# Patient Record
Sex: Male | Born: 1945
Health system: Southern US, Community
[De-identification: ages and names within clinical notes are randomized; demographics above are authoritative.]

## PROBLEM LIST (undated history)

## (undated) DIAGNOSIS — Z860100 Personal history of colon polyps, unspecified: Secondary | ICD-10-CM

## (undated) DIAGNOSIS — E785 Hyperlipidemia, unspecified: Secondary | ICD-10-CM

## (undated) DIAGNOSIS — N529 Male erectile dysfunction, unspecified: Secondary | ICD-10-CM

## (undated) DIAGNOSIS — R0600 Dyspnea, unspecified: Secondary | ICD-10-CM

## (undated) DIAGNOSIS — D649 Anemia, unspecified: Secondary | ICD-10-CM

## (undated) DIAGNOSIS — E119 Type 2 diabetes mellitus without complications: Secondary | ICD-10-CM

## (undated) DIAGNOSIS — I10 Essential (primary) hypertension: Secondary | ICD-10-CM

## (undated) DIAGNOSIS — E349 Endocrine disorder, unspecified: Secondary | ICD-10-CM

## (undated) HISTORY — DX: Personal history of colon polyps, unspecified: Z86.0100

## (undated) HISTORY — DX: Personal history of colonic polyps: Z86.010

## (undated) HISTORY — DX: Dyspnea, unspecified: R06.00

## (undated) HISTORY — DX: Essential (primary) hypertension: I10

## (undated) HISTORY — PX: CATARACT EXTRACTION: SUR2

## (undated) HISTORY — DX: Male erectile dysfunction, unspecified: N52.9

## (undated) HISTORY — DX: Endocrine disorder, unspecified: E34.9

## (undated) HISTORY — DX: Anemia, unspecified: D64.9

## (undated) HISTORY — DX: Type 2 diabetes mellitus without complications: E11.9

## (undated) HISTORY — DX: Hyperlipidemia, unspecified: E78.5

---

## 1975-09-27 HISTORY — PX: VASECTOMY: SHX75

## 1998-05-05 ENCOUNTER — Other Ambulatory Visit: Admission: RE | Admit: 1998-05-05 | Discharge: 1998-05-05 | Payer: Self-pay | Admitting: Gastroenterology

## 1998-09-26 HISTORY — PX: OTHER SURGICAL HISTORY: SHX169

## 2000-12-14 ENCOUNTER — Encounter: Admission: RE | Admit: 2000-12-14 | Discharge: 2000-12-14 | Payer: Self-pay | Admitting: Family Medicine

## 2000-12-14 ENCOUNTER — Encounter: Payer: Self-pay | Admitting: Family Medicine

## 2003-08-18 HISTORY — PX: COLONOSCOPY: SHX174

## 2006-07-24 ENCOUNTER — Encounter: Payer: Self-pay | Admitting: Internal Medicine

## 2008-03-25 ENCOUNTER — Ambulatory Visit: Payer: Self-pay | Admitting: Internal Medicine

## 2008-03-25 LAB — CONVERTED CEMR LAB
ALT: 40 units/L (ref 0–53)
AST: 28 units/L (ref 0–37)
Albumin: 4 g/dL (ref 3.5–5.2)
Alkaline Phosphatase: 111 units/L (ref 39–117)
BUN: 17 mg/dL (ref 6–23)
Basophils Absolute: 0 10*3/uL (ref 0.0–0.1)
Basophils Relative: 0.6 % (ref 0.0–1.0)
Bilirubin Urine: NEGATIVE
Bilirubin, Direct: 0.1 mg/dL (ref 0.0–0.3)
CO2: 30 meq/L (ref 19–32)
Calcium: 9.3 mg/dL (ref 8.4–10.5)
Chloride: 106 meq/L (ref 96–112)
Cholesterol: 170 mg/dL (ref 0–200)
Creatinine, Ser: 1.1 mg/dL (ref 0.4–1.5)
Eosinophils Absolute: 0.1 10*3/uL (ref 0.0–0.7)
Eosinophils Relative: 1.7 % (ref 0.0–5.0)
GFR calc Af Amer: 88 mL/min
GFR calc non Af Amer: 72 mL/min
Glucose, Bld: 170 mg/dL — ABNORMAL HIGH (ref 70–99)
HCT: 39.7 % (ref 39.0–52.0)
HDL: 44.4 mg/dL (ref >39.0)
Hemoglobin, Urine: NEGATIVE
Hemoglobin: 13.5 g/dL (ref 13.0–17.0)
Hgb A1c MFr Bld: 9.9 % — ABNORMAL HIGH (ref 4.6–6.0)
Ketones, ur: NEGATIVE mg/dL
LDL Cholesterol: 114 mg/dL — ABNORMAL HIGH (ref 0–99)
Leukocytes, UA: NEGATIVE
Lymphocytes Relative: 32.1 % (ref 12.0–46.0)
MCHC: 34 g/dL (ref 30.0–36.0)
MCV: 87.3 fL (ref 78.0–100.0)
Monocytes Absolute: 0.5 10*3/uL (ref 0.1–1.0)
Monocytes Relative: 8.3 % (ref 3.0–12.0)
Neutro Abs: 3.3 10*3/uL (ref 1.4–7.7)
Neutrophils Relative %: 57.3 % (ref 43.0–77.0)
Nitrite: NEGATIVE
PSA: 1.54 ng/mL (ref 0.10–4.00)
Platelets: 242 10*3/uL (ref 150–400)
Potassium: 4.2 meq/L (ref 3.5–5.1)
RBC: 4.54 M/uL (ref 4.22–5.81)
RDW: 12.6 % (ref 11.5–14.6)
Sodium: 143 meq/L (ref 135–145)
Specific Gravity, Urine: >=1.03 (ref 1.000–1.03)
TSH: 1.8 microintl units/mL (ref 0.35–5.50)
Total Bilirubin: 0.9 mg/dL (ref 0.3–1.2)
Total CHOL/HDL Ratio: 3.8
Total Protein: 7.1 g/dL (ref 6.0–8.3)
Triglycerides: 56 mg/dL (ref 0–149)
Urine Glucose: NEGATIVE mg/dL
Urobilinogen, UA: 0.2 (ref 0.0–1.0)
VLDL: 11 mg/dL (ref 0–40)
WBC: 5.8 10*3/uL (ref 4.5–10.5)
pH: 5.5 (ref 5.0–8.0)

## 2008-03-26 ENCOUNTER — Ambulatory Visit: Payer: Self-pay | Admitting: Internal Medicine

## 2008-03-26 DIAGNOSIS — E785 Hyperlipidemia, unspecified: Secondary | ICD-10-CM

## 2008-03-26 DIAGNOSIS — E119 Type 2 diabetes mellitus without complications: Secondary | ICD-10-CM

## 2008-03-26 DIAGNOSIS — E1129 Type 2 diabetes mellitus with other diabetic kidney complication: Secondary | ICD-10-CM | POA: Insufficient documentation

## 2008-03-26 DIAGNOSIS — Z860100 Personal history of colon polyps, unspecified: Secondary | ICD-10-CM | POA: Insufficient documentation

## 2008-03-26 DIAGNOSIS — I10 Essential (primary) hypertension: Secondary | ICD-10-CM | POA: Insufficient documentation

## 2008-06-23 ENCOUNTER — Ambulatory Visit: Payer: Self-pay | Admitting: Internal Medicine

## 2008-06-23 LAB — CONVERTED CEMR LAB
Cholesterol: 159 mg/dL (ref 0–200)
HDL: 37.9 mg/dL — ABNORMAL LOW (ref >39.0)
Hgb A1c MFr Bld: 7.5 % — ABNORMAL HIGH (ref 4.6–6.0)
LDL Cholesterol: 112 mg/dL — ABNORMAL HIGH (ref 0–99)
Total CHOL/HDL Ratio: 4.2
Triglycerides: 47 mg/dL (ref 0–149)
VLDL: 9 mg/dL (ref 0–40)

## 2008-06-24 ENCOUNTER — Encounter: Payer: Self-pay | Admitting: Internal Medicine

## 2008-06-26 ENCOUNTER — Ambulatory Visit: Payer: Self-pay | Admitting: Internal Medicine

## 2009-01-07 ENCOUNTER — Encounter: Payer: Self-pay | Admitting: Internal Medicine

## 2009-10-15 ENCOUNTER — Ambulatory Visit: Payer: Self-pay | Admitting: Internal Medicine

## 2009-10-15 DIAGNOSIS — N529 Male erectile dysfunction, unspecified: Secondary | ICD-10-CM | POA: Insufficient documentation

## 2009-10-15 LAB — CONVERTED CEMR LAB: Blood Glucose, Fingerstick: 153

## 2009-10-19 ENCOUNTER — Ambulatory Visit: Payer: Self-pay | Admitting: Internal Medicine

## 2009-10-19 LAB — CONVERTED CEMR LAB
ALT: 32 units/L (ref 0–53)
AST: 27 units/L (ref 0–37)
Albumin: 4.2 g/dL (ref 3.5–5.2)
Alkaline Phosphatase: 88 units/L (ref 39–117)
BUN: 21 mg/dL (ref 6–23)
Bilirubin, Direct: 0.1 mg/dL (ref 0.0–0.3)
CO2: 29 meq/L (ref 19–32)
Calcium: 9.3 mg/dL (ref 8.4–10.5)
Chloride: 107 meq/L (ref 96–112)
Cholesterol: 173 mg/dL (ref 0–200)
Creatinine, Ser: 1.1 mg/dL (ref 0.4–1.5)
GFR calc non Af Amer: 71.83 mL/min (ref >60)
Glucose, Bld: 151 mg/dL — ABNORMAL HIGH (ref 70–99)
HDL: 46.8 mg/dL (ref >39.00)
Hgb A1c MFr Bld: 8.3 % — ABNORMAL HIGH (ref 4.6–6.5)
LDL Cholesterol: 112 mg/dL — ABNORMAL HIGH (ref 0–99)
Potassium: 5 meq/L (ref 3.5–5.1)
Sodium: 142 meq/L (ref 135–145)
Testosterone: 296.69 ng/dL — ABNORMAL LOW (ref 350.00–890.00)
Total Bilirubin: 0.8 mg/dL (ref 0.3–1.2)
Total CHOL/HDL Ratio: 4
Total Protein: 7.4 g/dL (ref 6.0–8.3)
Triglycerides: 69 mg/dL (ref 0.0–149.0)
VLDL: 13.8 mg/dL (ref 0.0–40.0)

## 2009-10-20 ENCOUNTER — Encounter: Payer: Self-pay | Admitting: Internal Medicine

## 2009-12-10 ENCOUNTER — Telehealth: Payer: Self-pay | Admitting: Internal Medicine

## 2010-01-13 ENCOUNTER — Ambulatory Visit: Payer: Self-pay | Admitting: Internal Medicine

## 2010-02-15 ENCOUNTER — Telehealth: Payer: Self-pay | Admitting: Internal Medicine

## 2010-03-19 ENCOUNTER — Encounter: Payer: Self-pay | Admitting: Internal Medicine

## 2010-04-05 ENCOUNTER — Telehealth: Payer: Self-pay | Admitting: Internal Medicine

## 2010-04-23 ENCOUNTER — Ambulatory Visit: Payer: Self-pay | Admitting: Internal Medicine

## 2010-04-23 LAB — CONVERTED CEMR LAB
Cholesterol: 108 mg/dL (ref 0–200)
HDL: 33.5 mg/dL — ABNORMAL LOW (ref >39.00)
Hgb A1c MFr Bld: 8.1 % — ABNORMAL HIGH (ref 4.6–6.5)
LDL Cholesterol: 65 mg/dL (ref 0–99)
Total CHOL/HDL Ratio: 3
Triglycerides: 46 mg/dL (ref 0.0–149.0)
VLDL: 9.2 mg/dL (ref 0.0–40.0)

## 2010-04-29 ENCOUNTER — Encounter: Payer: Self-pay | Admitting: Internal Medicine

## 2010-05-20 ENCOUNTER — Telehealth: Payer: Self-pay | Admitting: Internal Medicine

## 2010-05-27 ENCOUNTER — Ambulatory Visit: Payer: Self-pay | Admitting: Internal Medicine

## 2010-05-28 ENCOUNTER — Telehealth: Payer: Self-pay | Admitting: Internal Medicine

## 2010-06-02 ENCOUNTER — Telehealth: Payer: Self-pay | Admitting: Internal Medicine

## 2010-08-25 ENCOUNTER — Ambulatory Visit: Payer: Self-pay | Admitting: Internal Medicine

## 2010-08-26 ENCOUNTER — Telehealth: Payer: Self-pay | Admitting: Internal Medicine

## 2010-08-26 DIAGNOSIS — E291 Testicular hypofunction: Secondary | ICD-10-CM | POA: Insufficient documentation

## 2010-09-02 ENCOUNTER — Ambulatory Visit: Payer: Self-pay | Admitting: Internal Medicine

## 2010-09-07 ENCOUNTER — Encounter: Payer: Self-pay | Admitting: Internal Medicine

## 2010-10-05 ENCOUNTER — Other Ambulatory Visit: Payer: Self-pay | Admitting: Internal Medicine

## 2010-10-05 ENCOUNTER — Ambulatory Visit
Admission: RE | Admit: 2010-10-05 | Discharge: 2010-10-05 | Payer: Self-pay | Source: Home / Self Care | Attending: Internal Medicine | Admitting: Internal Medicine

## 2010-10-05 LAB — HEMOGLOBIN A1C: Hgb A1c MFr Bld: 7.5 % — ABNORMAL HIGH (ref 4.6–6.5)

## 2010-10-05 LAB — TESTOSTERONE: Testosterone: 723.49 ng/dL (ref 350.00–890.00)

## 2010-10-14 ENCOUNTER — Encounter: Payer: Self-pay | Admitting: Internal Medicine

## 2010-10-26 NOTE — Progress Notes (Signed)
Summary: ANDROGEL NOT COVERED  Phone Note From Pharmacy   Caller: [facility]* Summary of Call: Pharm called, Androgel is not covered by insurance. Testim gel is covered and they want to change rx, OK?  Initial call taken by: Lamar Sprinkles, CMA,  April 05, 2010 12:06 PM  Follow-up for Phone Call        ok to chagne to testim Follow-up by: Jacques Navy MD,  April 05, 2010 1:48 PM  Additional Follow-up for Phone Call Additional follow up Details #1::        Spoke w/pharmacy to verify medication. Called in refill and updated EMR Additional Follow-up by: Lamar Sprinkles, CMA,  April 05, 2010 5:55 PM    New/Updated Medications: TESTIM 1 % GEL (TESTOSTERONE) use as directed once daily TESTIM 1 % GEL (TESTOSTERONE) 5 g packet once daily Prescriptions: TESTIM 1 % GEL (TESTOSTERONE) 5 g packet once daily  #3 mth x 1   Entered by:   Lamar Sprinkles, CMA   Authorized by:   Jacques Navy MD   Signed by:   Lamar Sprinkles, CMA on 04/05/2010   Method used:   Telephoned to ...       [facility]* (retail)       [address]       [place], [place]  [address]       Ph: [phone]       Fax: [phone]   RxID:   [id number] TESTIM 1 % GEL (TESTOSTERONE) use as directed once daily  #30 x 11   Entered by:   Lucious Groves   Authorized by:   Jacques Navy MD   Signed by:   Lucious Groves on 04/05/2010   Method used:   Printed then faxed to ...       [facility]* (retail)       [address]       [place], [place]  [address]       Ph: [phone]       Fax: [phone]   RxID:   [id number]

## 2010-10-26 NOTE — Progress Notes (Signed)
Summary: F/u - RESULTS  Phone Note Call from Patient   Summary of Call: Pt left vm that he did not get lab results from last office visit. Gave pt info over the phone and transferred to scheduler for f/u office visit as MD requested.  Initial call taken by: Lamar Sprinkles, CMA,  May 20, 2010 3:47 PM

## 2010-10-26 NOTE — Letter (Signed)
   [facility] [address] [place], [place]  [address] Phone: [phone]      April 29, 2010   SIGISMUND CROSS [address] [place], [place] [address]  RE:  LAB RESULTS  Dear  Mr. Nabozny,  The following is an interpretation of your most recent lab tests.  Please take note of any instructions provided or changes to medications that have resulted from your lab work.  Health professionals look at cholesterol as more involved than just the total cholesterol. We consider the level of LDL (bad) cholesterol, HDL (good), cholesterol, and Triglycerides (Grease) in the blood.  1. Your LDL should be under 100, and the HDL should be over 45, if you have any vascular disease such as heart attack, angina, stroke, TIA (mini stroke), claudication (pain in the legs when you walk due to poor circulation),  Abdominal Aortic Aneurysm (AAA), diabetes or prediabetes.  2. Your LDL should be under 130 if you have any two of the following:     a. Smoke or chew tobacco,     b. High blood pressure (if you are on medication or over 140/90 without medication),     c. Male gender,    d. HDL below 40,    e. A male relative (father, brother, or son), who have had any vascular event          as described in #1. above under the age of 49, or a male relative (mother,       sister, or daughter) who had an event as described above under age 17. (An HDL over 60 will subtract one risk factor from the total, so if you have two items in # 2 above, but an HDL over 60, you then fall into category # 3 below).  3. Your LDL should be under 160 if you have any one of the above.  Triglycerides should be under 200 with the ideal being under 150.  For diabetes or pre-diabetes, the ideal HgbA1C should be under 6.0%.  If you fall into any of the above categories, you should make a follow up appointment to discuss this with your physician.  LIPID PANEL:  Good - no changes needed Triglyceride: 46.0    Cholesterol: 108   LDL: 65   HDL: 33.50   Chol/HDL%:  3   DIABETIC STUDIES:  Poor - schedule a follow-up appointment soon Blood Glucose: 151   HgbA1C: 8.1      Cholesterol is great but blood sugar has not been controlled. Please schedule an appointment so we can address the diabetes.    Sincerely Yours,    Jacques Navy MD

## 2010-10-26 NOTE — Letter (Signed)
[facility] [address] [place], [place]  [address] Phone: [phone]      October 20, 2009   Theodore Estes [address] [place] [address] [place], [place] [address]  RE:  LAB RESULTS  Dear  Mr. Bady,  The following is an interpretation of your most recent lab tests.  Please take note of any instructions provided or changes to medications that have resulted from your lab work.  ELECTROLYTES:  Good - no changes needed  KIDNEY FUNCTION TESTS:  Good - no changes needed  LIVER FUNCTION TESTS:  Good - no changes needed  Health professionals look at cholesterol as more involved than just the total cholesterol. We consider the level of LDL (bad) cholesterol, HDL (good), cholesterol, and Triglycerides (Grease) in the blood.  1. Your LDL should be under 100, and the HDL should be over 45, if you have any vascular disease such as heart attack, angina, stroke, TIA (mini stroke), claudication (pain in the legs when you walk due to poor circulation),  Abdominal Aortic Aneurysm (AAA), diabetes or prediabetes.  2. Your LDL should be under 130 if you have any two of the following:     a. Smoke or chew tobacco,     b. High blood pressure (if you are on medication or over 140/90 without medication),     c. Male gender,    d. HDL below 40,    e. A male relative (father, brother, or son), who have had any vascular event          as described in #1. above under the age of 68, or a male relative (mother,       sister, or daughter) who had an event as described above under age 70. (An HDL over 60 will subtract one risk factor from the total, so if you have two items in # 2 above, but an HDL over 60, you then fall into category # 3 below).  3. Your LDL should be under 160 if you have any one of the above.  Triglycerides should be under 200 with the ideal being under 150.  For diabetes or pre-diabetes, the ideal HgbA1C should be under 6.0%.  If you fall into any of the above categories,  you should make a follow up appointment to discuss this with your physician.  LIPID PANEL:  Fair - review at your next visit Triglyceride: 69.0   Cholesterol: 173   LDL: 112   HDL: 46.80   Chol/HDL%:  4   DIABETIC STUDIES:  Poor - schedule a follow-up appointment soon Blood Glucose: 151   HgbA1C: 8.3     Testosteron 296 - low  Please call my office to make arrangements for further tests or an appointment.  Sincerely Yours,    Jacques Navy MD  Patient: Pia Mau Note: All result statuses are Final unless otherwise noted.  Tests: (1) Lipid Panel (LIPID)   Cholesterol               173 mg/dL                   2-956     ATP III Classification            Desirable:  < 200 mg/dL                    Borderline High:  200 - 239 mg/dL               High:  > =  240 mg/dL   Triglycerides             69.0 mg/dL                  0.4-540.9     Normal:  <150 mg/dL     Borderline High:  811 - 199 mg/dL   HDL                       91.47 mg/dL                 >82.95   VLDL Cholesterol          13.8 mg/dL                  6.2-13.0   LDL Cholesterol      [H]  865 mg/dL                   7-84  CHO/HDL Ratio:  CHD Risk                             4                    Men          Women     1/2 Average Risk     3.4          3.3     Average Risk          5.0          4.4     2X Average Risk          9.6          7.1     3X Average Risk          15.0          11.0                           Tests: (2) Hepatic/Liver Function Panel (HEPATIC)   Total Bilirubin           0.8 mg/dL                   6.9-6.2   Direct Bilirubin          0.1 mg/dL                   9.5-2.8   Alkaline Phosphatase      88 U/L                      39-117   AST                       27 U/L                      0-37   ALT                       32 U/L                      0-53   Total Protein             7.4 g/dL  6.0-8.3   Albumin                   4.2 g/dL                    1.6-1.0  Tests: (3)  BMP (METABOL)   Sodium                    142 mEq/L                   135-145   Potassium                 5.0 mEq/L                   3.5-5.1   Chloride                  107 mEq/L                   96-112   Carbon Dioxide            29 mEq/L                    19-32   Glucose              [H]  151 mg/dL                   96-04   BUN                       21 mg/dL                    5-40   Creatinine                1.1 mg/dL                   9.8-1.1   Calcium                   9.3 mg/dL                   9.1-47.8   GFR                       71.83 mL/min                >60  Tests: (4) Hemoglobin A1C (A1C)   Hemoglobin A1C       [H]  8.3 %                       4.6-6.5     Glycemic Control Guidelines for People with Diabetes:     Non Diabetic:  <6%     Goal of Therapy: <7%     Additional Action Suggested:  >8%   Tests: (5) Testosterone, Total (TESTO)   Testosterone         [L]  296.69 ng/dL                295.62-130.86

## 2010-10-26 NOTE — Progress Notes (Signed)
Summary: ANDROGEL CHANGE  Phone Note From Pharmacy   Summary of Call: Pharm called, Rx for androgel is currently 1 percent. This is no longer made. Per pharm, The new Androgel 1.62% is only rx available, if this is ok doseage needs to be adjusted.  Initial call taken by: Lamar Sprinkles, CMA,  Feb 15, 2010 10:01 AM  Follow-up for Phone Call        OK for new strength Apply 5 g patch daily, # 30 patches, refill prn Follow-up by: Jacques Navy MD,  Feb 15, 2010 1:35 PM  Additional Follow-up for Phone Call Additional follow up Details #1::        [facility] Tawanna Cooler called requesting MD change Rx because pt has called pharmacy several times. Todd informed Additional Follow-up by: Margaret Pyle, CMA,  Feb 15, 2010 3:47 PM    New/Updated Medications: * ANDROGEL PUMP 1.62% GEL (TESTOSTERONE) use as directed once daily Prescriptions: ANDROGEL PUMP 1.62% GEL (TESTOSTERONE) use as directed once daily  #30 x 11   Entered and Authorized by:   Margaret Pyle, CMA   Signed by:   Margaret Pyle, CMA on 02/15/2010   Method used:   Telephoned to ...       [facility]* (retail)       [address]       [place], [place]  [address]       Ph: [phone]       Fax: [phone]   RxID:   [id number]

## 2010-10-26 NOTE — Progress Notes (Signed)
  Phone Note From Pharmacy   Caller: [facility]* Summary of Call: Pharm called, Actos will cost almost 100 dollars. They would like alternative is possible.  Initial call taken by: Lamar Sprinkles, CMA,  May 28, 2010 11:25 AM  Follow-up for Phone Call        tried calling patient - no answer. called pharmacy - no answer left a message: should be on formulary. If there is a deductible issue it will be the same for most products that I would prescribe. Follow-up by: Jacques Navy MD,  May 28, 2010 6:37 PM

## 2010-10-26 NOTE — Assessment & Plan Note (Signed)
Summary: UNSTABLE BLOOD SUGAR LEVELS-LB   Vital Signs:  Patient profile:   65 year old male Height:      69 inches Weight:      200 pounds BMI:     29.64 O2 Sat:      97 % on Room air Temp:     97.9 degrees F oral Pulse rate:   71 / minute BP sitting:   148 / 100  (left arm) Cuff size:   large  Vitals Entered By: Ami Bullins CMA (October 15, 2009 4:36 PM)  O2 Flow:  Room air CC: PT here to discuss issues with CBGs. Pt is having trouble keeping his blood sugar under 125/ ab CBG Result 153   Primary Care Provider:  Cam Harnden  CC:  PT here to discuss issues with CBGs. Pt is having trouble keeping his blood sugar under 125/ ab.  History of Present Illness: Patient not seen for some time. He has been diagnosed with diabetes but has not had lab follow-up. He reports that his CBGs have been elevated but he has not had many symptoms. He takes no medications.  He does c/o ED - inability to maintain an erection.    Current Medications (verified): 1)  Aspirin 81 Mg  Tabs (Aspirin) .... Take 1 Tablet By Mouth Once A Day 2)  Enalapril Maleate 20 Mg  Tabs (Enalapril Maleate) .Marland Kitchen.. 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    Physical Exam  General:  Well-developed,well-nourished,in no acute distress; alert,appropriate and cooperative throughout examination Head:  normocephalic and atraumatic.   Eyes:  corneas and lenses clear and no injection.   Lungs:  normal respiratory effort.   Heart:  normal rate and regular rhythm.     Impression & Recommendations:  Problem # 1:  DIABETES MELLITUS, TYPE II, UNCONTROLLED (ICD-250.02)  His updated medication list for this problem includes:    Aspirin 81 Mg Tabs (Aspirin) .Marland Kitchen... Take 1 tablet by mouth once a day    Enalapril Maleate 20 Mg Tabs (Enalapril maleate) .Marland Kitchen... 1 by mouth once daily  Labs Reviewed: Creat: 1.1 (03/25/2008)    Reviewed HgBA1c results: 7.5 (06/23/2008)  9.9 (03/25/2008)  Patient now with elevated CBGs but no significant symptoms.  Plan A1C with recommendations to follow         Reviewed with the patient the goals of treatment and management.  Problem # 2:  ERECTILE DYSFUNCTION, ORGANIC (ICD-607.84) Possibly due to diabetes vs vascular disease vs testosterone deficiency  Plan - lab for testosterone level           Trial of levitra  His updated medication list for this problem includes:    Levitra 20 Mg Tabs (Vardenafil hcl) .Marland Kitchen... 1 by mouth as needed  Problem # 3:  HYPERTENSION (ICD-401.9)  His updated medication list for this problem includes:    Enalapril Maleate 20 Mg Tabs (Enalapril maleate) .Marland Kitchen... 1 by mouth once daily  BP today: 148/100 Prior BP: 104/70 (06/26/2008)  ]Subotpimal control - incrased risk cardiovascular disease  Plan - continue meds           monitor BP at home  Complete Medication List: 1)  Aspirin 81 Mg Tabs (Aspirin) .... Take 1 tablet by mouth once a day 2)  Enalapril Maleate 20 Mg Tabs (Enalapril maleate) .Marland Kitchen.. 1 by mouth once daily 3)  Levitra 20 Mg Tabs (Vardenafil hcl) .Marland Kitchen.. 1 by mouth as  needed  Laboratory Results   Blood Tests    Date/Time Reported: 10/15/2009 at 4:30pm  CBG Random:: 153mg /dL

## 2010-10-26 NOTE — Letter (Signed)
Summary: [facility]  [facility]   Imported By: Lennie Odor 03/30/2010 11:40:32  _____________________________________________________________________  External Attachment:    Type:   Image     Comment:   External Document

## 2010-10-26 NOTE — Progress Notes (Signed)
  Phone Note Call from Patient   Caller: [facility] Summary of Call: Pt has high deductible plan and wants to know if there is a cheaper alternative to actos. Need to call pharm and inform per last phone note that any alternatives will be same high cost and MD would like pt to take medication.  Initial call taken by: Lamar Sprinkles, CMA,  June 02, 2010 1:48 PM  Follow-up for Phone Call        Pharm informed Follow-up by: Lamar Sprinkles, CMA,  June 02, 2010 2:51 PM

## 2010-10-26 NOTE — Progress Notes (Signed)
Summary: results  Phone Note Call from Patient   Caller: Spouse Summary of Call: Patient spouse called for lab results, specifically A1c. CPX schedule for 4/11. Please advise. Initial call taken by: Lucious Groves,  December 10, 2009 3:17 PM  Follow-up for Phone Call        last labs jan 24th - letter sent jan 25th. No other labs in system. OK to resend the Jan 25th letter.  Follow-up by: Jacques Navy MD,  December 10, 2009 6:15 PM  Additional Follow-up for Phone Call Additional follow up Details #1::        letter mailed Additional Follow-up by: Lamar Sprinkles, CMA,  December 11, 2009 12:05 PM

## 2010-10-26 NOTE — Assessment & Plan Note (Signed)
Summary: CPX/#/CD    Vital Signs:  Patient profile:   65 year old male Height:      69 inches (175.26 cm) Weight:      203 pounds (92.27 kg) BMI:     30.09 O2 Sat:      95 % on Room air Temp:     97.9 degrees F (36.61 degrees C) oral Pulse rate:   88 / minute Pulse rhythm:   regular BP sitting:   126 / 76  (left arm) Cuff size:   large  Vitals Entered By: Brenton Grills (January 13, 2010 9:21 AM)  O2 Flow:  Room air CC: CPX/pt is due for tetanus/aj   Primary Care Provider:  Kharson Rasmusson  CC:  CPX/pt is due for tetanus/aj.  History of Present Illness: Patient last seen in Jan '11. He returns today for complete exam.   Reviewed labs from Jan: A1C 8.3%, LDL 112; Testosterone 297 (350-890).   He reports that Levitra was not effective for ED.   He has no other complaints and has been feeling well. He is trying to follow a no sugar low carb diet.   Current Medications (verified): 1)  Aspirin 81 Mg  Tabs (Aspirin) .... Take 1 Tablet By Mouth Once A Day 2)  Enalapril Maleate 20 Mg  Tabs (Enalapril Maleate) .Marland Kitchen.. 1 By Mouth Once Daily 3)  Levitra 20 Mg Tabs (Vardenafil Hcl) .Marland Kitchen.. 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  2 daughters - '76, '77; 5 grandchildren marriage in good health  Risk Factors: Caffeine Use: 4 (2008/04/18) Exercise: no (2008-04-18)  Past Medical History: UCD ERECTILE DYSFUNCTION, ORGANIC (ICD-607.84) HEALTHY ADULT MALE (ICD-V70.0) HYPERLIPIDEMIA (ICD-272.4) DIABETES MELLITUS, TYPE II, UNCONTROLLED (ICD-250.02) COLONIC POLYPS, HX OF  (ICD-V12.72) HYPERTENSION (ICD-401.9)  Review of Systems  The patient denies anorexia, fever, weight loss, weight gain, vision loss, decreased hearing, chest pain, syncope, dyspnea on exertion, prolonged cough, hemoptysis, abdominal pain, hematochezia, hematuria, genital sores, muscle weakness, transient blindness, depression, abnormal bleeding, and angioedema.    Physical Exam  General:  alert, well-developed, well-nourished, and normal appearance.   Head:  normocephalic, atraumatic, and no abnormalities observed.   Eyes:  pupils equal, pupils round, corneas and lenses clear, no injection, and no retinal abnormalitiies.   Ears:  R ear normal and L ear normal.   Nose:  no external deformity and no external erythema.   Mouth:  Oral mucosa and oropharynx without lesions or exudates.  Teeth in good repair. Neck:  supple, full ROM, no thyromegaly, no carotid bruits, and no cervical lymphadenopathy.   Chest Wall:  no deformities and no tenderness.   Lungs:  Normal respiratory effort, chest expands symmetrically. Lungs are clear to auscultation, no crackles or wheezes. Heart:  Normal rate and regular rhythm. S1 and S2 normal without gallop, murmur, click, rub or other extra sounds. Abdomen:  soft, non-tender, normal bowel sounds, no guarding, and no hepatomegaly.   Rectal:  No external abnormalities noted. Normal sphincter tone. No rectal masses or tenderness. Genitalia:  uncircumcised and no hydrocele.  Small left varicocele. Testicle normal texture, just a little small. Palpable vasectomy clip Prostate:  Prostate gland firm  and smooth, no enlargement, nodularity, tenderness, mass, asymmetry or induration. Msk:  normal ROM, no joint tenderness, no joint swelling, no joint warmth, no redness over joints, and no joint deformities.   Pulses:  2+ RADIAL  AND dp PULSES Extremities:  No clubbing, cyanosis, edema, or deformity noted with normal full range of motion of all joints.   Neurologic:  No  cranial nerve deficits noted. Station and gait are normal. Plantar reflexes are down-going bilaterally. DTRs are symmetrical throughout. Sensory, motor and coordinative functions appear intact. Skin:  turgor normal and color normal.  Icthyosis wide-spread. NO open lesions. Cervical Nodes:  no anterior cervical adenopathy and no posterior cervical adenopathy.   Axillary Nodes:  no R axillary adenopathy and no L axillary adenopathy.   Psych:  Oriented X3, memory intact for recent and remote, normally interactive, and good eye contact.    Diabetes Management Exam:    Foot Exam (with socks and/or shoes not present):       Sensory-Pinprick/Light touch:          Left medial foot (L-4): normal          Left dorsal foot (L-5): normal          Left lateral foot (S-1): normal          Right medial foot (L-4): normal          Right dorsal foot (L-5): normal          Right lateral foot (S-1): normal       Sensory-other: decreased deep vibratory sensation   Impression & Recommendations:  Problem # 1:  DIABETES MELLITUS, TYPE II, UNCONTROLLED (ICD-250.02) Reviewed January labs with the patient - A1C 8.3% making a definitive diagnosis. Discussed his increased risks for cardiac disease and other end organ disease. The importance of good control was stressed as well as the need to carefully manage all other risk factors.  Plan - continue sugar free, low carb diet           strive for exercise daily           start metformin 500mg  two times a day           follow-up lab in 3 months  His updated medication list for this problem includes:    Aspirin 81 Mg Tabs (Aspirin) .Marland Kitchen... Take 1 tablet by mouth once a day    Enalapril Maleate 20 Mg Tabs (Enalapril maleate) .Marland Kitchen... 1 by mouth once daily    Metformin Hcl 500 Mg Tabs (Metformin hcl) .Marland Kitchen... 1 by mouth two times a day  Problem # 2:  HYPERTENSION (ICD-401.9)  His updated medication list for this problem includes:    Enalapril Maleate 20 Mg Tabs (Enalapril  maleate) .Marland Kitchen... 1 by mouth once daily  BP today: 126/76 Prior BP: 148/100 (10/15/2009)  Labs Reviewed: K+: 5.0 (10/19/2009) Creat: : 1.1 (10/19/2009)   Chol: 173 (10/19/2009)   HDL: 46.80 (10/19/2009)   LDL: 112 (10/19/2009)   TG: 69.0 (10/19/2009)  Good control. Also renal benefit of ACE-I in setting of diabetes  Problem # 3:  HYPERLIPIDEMIA (ICD-272.4)  His updated medication list for this problem includes:    Lovastatin 20 Mg Tabs (Lovastatin) .Marland Kitchen... 1 by mouth qpm  Labs Reviewed: SGOT: 27 (10/19/2009)   SGPT: 32 (10/19/2009)   HDL:46.80 (10/19/2009), 37.9 (06/23/2008)  LDL:112 (10/19/2009), 112 (06/23/2008)  Chol:173 (10/19/2009), 159 (06/23/2008)  Trig:69.0 (10/19/2009), 47 (06/23/2008)  Explained that the goal for a diabetic is an LDL 100 or less.  Furthermore, the literature supports the use of a "statin" drug in any diabetic regardless of the baseline LDL for the cardio-protective effect.  Plan - start lovastatin 20mg  once daily           follow-up lab 3 months.   Problem # 4:  ERECTILE DYSFUNCTION, ORGANIC (KGM-010.27) Patient with ED that did not respond to levitra. Possibly diabetic related vs hypogonadism with a below normal testosterone.  Plan - Androgel 5g apply daily           if no improvement in 4-8 weeks will consider urology referral.  The following medications were removed from the medication list:    Levitra 20 Mg Tabs (Vardenafil hcl) .Marland Kitchen... 1 by mouth as needed  Problem # 5:  Preventive Health Care (ICD-V70.0) History as noted. Normal physical exam. Lab results generally OK  with exceptions above. Current with colorectal cancer screening and prostate cancer screening. Has had zostavax. Is a candidate for pneumovax and tetnus.  In summary - a very nice man who appears medically stable today. Will address his diabetes and associated risk factors as outlined above. He will return for follow-up lab in 3 months.   Complete Medication List: 1)  Aspirin 81 Mg Tabs  (Aspirin) .... Take 1 tablet by mouth once a day 2)  Enalapril Maleate 20 Mg Tabs (Enalapril maleate) .Marland Kitchen.. 1 by mouth once daily 3)  Metformin Hcl 500 Mg Tabs (Metformin hcl) .Marland Kitchen.. 1 by mouth two times a day 4)  Androgel 50 Mg/5gm Gel (Testosterone) .... Apply 5 g q am 5)  Lovastatin 20 Mg Tabs (Lovastatin) .Marland Kitchen.. 1 by mouth qpm   Patient: Theodore Estes Note: All result statuses are Final unless otherwise noted.  Tests: (1) Lipid Panel (LIPID)   Cholesterol               173 mg/dL                   2-536     ATP III Classification            Desirable:  < 200 mg/dL                    Borderline High:  200 - 239 mg/dL               High:  > = 240 mg/dL   Triglycerides             69.0 mg/dL                  6.4-403.4     Normal:  <150 mg/dL     Borderline High:  742 - 199 mg/dL   HDL                       59.56 mg/dL                 >38.75   VLDL Cholesterol          13.8 mg/dL                  6.4-33.2   LDL Cholesterol      [H]  951 mg/dL                   8-84  CHO/HDL Ratio:  CHD Risk  4                    Men          Women     1/2 Average Risk     3.4          3.3     Average Risk          5.0          4.4     2X Average Risk          9.6          7.1     3X Average Risk          15.0          11.0                           Tests: (2) Hepatic/Liver Function Panel (HEPATIC)   Total Bilirubin           0.8 mg/dL                   5.9-5.6   Direct Bilirubin          0.1 mg/dL                   3.8-7.5   Alkaline Phosphatase      88 U/L                      39-117   AST                       27 U/L                      0-37   ALT                       32 U/L                      0-53   Total Protein             7.4 g/dL                    6.4-3.3   Albumin                   4.2 g/dL                    2.9-5.1  Tests: (3) BMP (METABOL)   Sodium                    142 mEq/L                   135-145   Potassium                 5.0 mEq/L                    3.5-5.1   Chloride                  107 mEq/L                   96-112   Carbon Dioxide            29 mEq/L  19-32   Glucose              [H]  151 mg/dL                   09-81   BUN                       21 mg/dL                    1-91   Creatinine                1.1 mg/dL                   4.7-8.2   Calcium                   9.3 mg/dL                   9.5-62.1   GFR                       71.83 mL/min                >60  Tests: (4) Hemoglobin A1C (A1C)   Hemoglobin A1C       [H]  8.3 %                       4.6-6.5     Glycemic Control Guidelines for People with Diabetes:     Non Diabetic:  <6%     Goal of Therapy: <7%     Additional Action Suggested:  >8%   Tests: (5) Testosterone, Total (TESTO)   Testosterone         [L]  296.69 ng/dL                [id number]: ANDROGEL 50 MG/5GM GEL (TESTOSTERONE) apply 5 g q AM  #30 x 5   Entered and Authorized by:   Jacques Navy MD   Signed by:   Jacques Navy MD on 01/13/2010   Method used:   Print then Give to Patient   RxID:   [id number] ENALAPRIL MALEATE 20 MG  TABS (ENALAPRIL MALEATE) 1 by mouth once daily  #30 x 12   Entered and Authorized by:   Jacques Navy MD   Signed by:   Jacques Navy MD on 01/13/2010   Method used:   Electronically to        [facility]* (retail)       [address]       [place], [place]  [address]       Ph: [phone]       Fax: [phone]   RxID:   [id number] LOVASTATIN 20 MG TABS (LOVASTATIN) 1 by mouth qPM  #30 x 12   Entered and Authorized by:   Jacques Navy MD   Signed by:   Jacques Navy MD on 01/13/2010   Method used:   Electronically to        [facility]* (retail)       [address]       [place], [place]  [address]       Ph: [phone]       Fax: [phone]   RxID:   [id number] METFORMIN HCL 500 MG TABS (METFORMIN HCL) 1 by mouth two times a day  #60 x 12   Entered  and Authorized by:   Jacques Navy  MD   Signed by:   Jacques Navy MD on 01/13/2010   Method used:   Electronically to        [facility]* (retail)       [address]       [place], [place]  [address]       Ph: [phone]       Fax: [phone]   RxID:   [id number]      Immunization History:  Influenza Immunization History:    Influenza:  historical (06/26/2009)

## 2010-10-26 NOTE — Assessment & Plan Note (Signed)
Summary: FU ON LABS/ NWS  #   Vital Signs:  Patient profile:   65 year old male Height:      69 inches Weight:      196 pounds BMI:     29.05 O2 Sat:      96 % on Room air Temp:     97.6 degrees F oral Pulse rate:   78 / minute BP sitting:   120 / 92  (left arm) Cuff size:   regular  Vitals Entered By: Ami Bullins CMA (May 27, 2010 11:00 AM)  O2 Flow:  Room air CC: pt here for follow up on labs/ ab   Primary Care Provider:  Norins  CC:  pt here for follow up on labs/ ab.  History of Present Illness: Patient presents to discuss diabetes control. Recent lab revealed A1C 8.1%, down from 8.3% on metformin.  Patinet iwth low testosterone has had to switch from androgel to Testim and now neither is covered. He has been told by Dr. Mindi Junker LIttle about testosterone 5% in LIdoderm that is compounded by [facility].  Current Medications (verified): 1)  Aspirin 81 Mg  Tabs (Aspirin) .... Take 1 Tablet By Mouth Once A Day 2)  Enalapril Maleate 20 Mg  Tabs (Enalapril Maleate) .Marland Kitchen.. 1 By Mouth Once Daily 3)  Metformin Hcl 500 Mg Tabs (Metformin Hcl) .Marland Kitchen.. 1 By Mouth Two Times A Day 4)  Androgel 50 Mg/5gm Gel (Testosterone) .... Apply 5 G Q Am 5)  Lovastatin 20 Mg Tabs (Lovastatin) .Marland Kitchen.. 1 By Mouth Qpm 6)  Testim 1 % Gel (Testosterone) .... 5 G Packet Once Daily  Allergies (verified): No Known Drug Allergies PMH-FH-SH reviewed-no changes except otherwise noted  Review of Systems  The patient denies anorexia, weight loss, weight gain, chest pain, abdominal pain, depression, and enlarged lymph nodes.    Physical Exam  General:  Well-developed,well-nourished,in no acute distress; alert,appropriate and cooperative throughout examination Eyes:  C&S clear Lungs:  normal respiratory effort.   Heart:  normal rate and regular rhythm.   Msk:  normal ROM.   Pulses:  2+ radial Neurologic:  alert & oriented X3.   Skin:  turgor normal and color normal.   Psych:  Oriented X3,  normally interactive, good eye contact, and not anxious appearing.     Impression & Recommendations:  Problem # 1:  DIABETES MELLITUS, TYPE II, UNCONTROLLED (ICD-250.02) Poor control. Reviewed criteria - A1C needs to be less than 7%  Plan - increase metformin to 1000mg  two times a day          add actos 15mg  once daily           recheck A1C in 3 months  His updated medication list for this problem includes:    Aspirin 81 Mg Tabs (Aspirin) .Marland Kitchen... Take 1 tablet by mouth once a day    Enalapril Maleate 20 Mg Tabs (Enalapril maleate) .Marland Kitchen... 1 by mouth once daily    Metformin Hcl 1000 Mg Tabs (Metformin hcl) .Marland Kitchen... 1 by mouth two times a day for diabetes control    Actos 15 Mg Tabs (Pioglitazone hcl) .Marland Kitchen... 1 by mouth once daily for control of diabetes  Problem # 2:  ERECTILE DYSFUNCTION, ORGANIC (ICD-607.84) Patient did see cognitive improvement and increased libido with treatment.  Plan - testosterone 5% in lidoderm (50mg /cc)- apply 1 cc daily - [facility]  Complete Medication List: 1)  Aspirin 81 Mg Tabs (Aspirin) .... Take 1 tablet  by mouth once a day 2)  Enalapril Maleate 20 Mg Tabs (Enalapril maleate) .Marland Kitchen.. 1 by mouth once daily 3)  Metformin Hcl 1000 Mg Tabs (Metformin hcl) .Marland Kitchen.. 1 by mouth two times a day for diabetes control 4)  Androgel 50 Mg/5gm Gel (Testosterone) .... Apply 5 g q am 5)  Lovastatin 20 Mg Tabs (Lovastatin) .Marland Kitchen.. 1 by mouth qpm 6)  Testim 1 % Gel (Testosterone) .... 5 g packet once daily 7)  Actos 15 Mg Tabs (Pioglitazone hcl) .Marland Kitchen.. 1 by mouth once daily for control of diabetes 8)  Testosterone 5% in Lidoderm   Patient Instructions: 1)  Diabetes- poor control with A1C 8.1% 9was 8.3%) with a goal of 7% or less to reduce risk of target organ damage related to diabetes. Plan - increase metformin to 1000mg  two times a day; add Actos 15mg  once daily. Continue with strict diet control and regular aerobic exercise at least 30 min 3 times a week.  Follow-up A1C in 3 months.  Prescriptions: METFORMIN HCL 1000 MG TABS (METFORMIN HCL) 1 by mouth two times a day for diabetes control  #60 x 12   Entered and Authorized by:   Jacques Navy MD   Signed by:   Jacques Navy MD on 05/27/2010   Method used:   Electronically to        [facility]* (retail)       [address]       [place], [place]  [address]       Ph: [phone]       Fax: [phone]   RxID:   [id number] ACTOS 15 MG TABS (PIOGLITAZONE HCL) 1 by mouth once daily for control of diabetes  #30 x 12   Entered and Authorized by:   Jacques Navy MD   Signed by:   Jacques Navy MD on 05/27/2010   Method used:   Electronically to        [facility]* (retail)       [address]       [place], [place]  [address]       Ph: [phone]       Fax: [phone]   RxID:   [id number]

## 2010-10-26 NOTE — Progress Notes (Signed)
Summary: Labs  Phone Note Call from Patient   Summary of Call: Patient is requesting labs for testosterone.  Initial call taken by: Lamar Sprinkles, CMA,  August 26, 2010 12:20 PM  Follow-up for Phone Call        ok  257.2 Follow-up by: Jacques Navy MD,  August 26, 2010 1:00 PM  Additional Follow-up for Phone Call Additional follow up Details #1::        lm for pt to call me back Additional Follow-up by: Ami Bullins CMA,  August 26, 2010 1:42 PM  New Problems: HYPOGONADISM (ICD-257.2)   Additional Follow-up for Phone Call Additional follow up Details #2::    informed pt and labs put in IDX Follow-up by: Ami Bullins CMA,  August 26, 2010 1:49 PM  New Problems: HYPOGONADISM (ICD-257.2)

## 2010-10-28 NOTE — Letter (Signed)
[facility]   Imported By: Sherian Rein 09/13/2010 11:37:11  _____________________________________________________________________  External Attachment:    Type:   Image     Comment:   External Document

## 2010-10-28 NOTE — Letter (Signed)
   [facility] [address] [place], [place]  [address] Phone: [phone]      October 14, 2010   TAIWAN TALCOTT [address] [place], [place] [address]  RE:  LAB RESULTS  Dear  Mr. Kazanjian,  The following is an interpretation of your most recent lab tests.  Please take note of any instructions provided or changes to medications that have resulted from your lab work.    DIABETIC STUDIES:  Good - no changes needed Blood Glucose: 151   HgbA1C: 7.5    Tesotosterone 723.4 - normal range.  Better control of diabetes.Continue same medications and continue no sugar low carb diet.  Tesotsterone replacement is good.   Please come see me if you have any questions about these lab results.    Sincerely Yours,    Jacques Navy MD

## 2010-12-08 ENCOUNTER — Telehealth: Payer: Self-pay | Admitting: Internal Medicine

## 2010-12-23 NOTE — Progress Notes (Signed)
  Phone Note Refill Request Message from:  Fax from Pharmacy on December 08, 2010 1:08 PM  Refills Requested: Medication #1:  TESTOSTERONE 5% IN LIDODERM. received refill request from [facility]   Follow-up for Phone Call        ok for refill prn Follow-up by: Jacques Navy MD,  December 08, 2010 4:19 PM    New/Updated Medications: * TESTOSTERONE 5% IN LIDODERM apply 1 cc daily Prescriptions: TESTOSTERONE 5% IN LIDODERM apply 1 cc daily  #1 month supp x 1   Entered by:   Ami Bullins CMA   Authorized by:   Jacques Navy MD   Signed by:   Bill Salinas CMA on 12/13/2010   Method used:   Telephoned to ...       [facility]* (retail)       [address]       [place], [place]  [address]       Ph: [phone]       Fax: [phone]   RxID:   [id number]

## 2011-02-01 ENCOUNTER — Telehealth: Payer: Self-pay | Admitting: *Deleted

## 2011-02-01 MED ORDER — ENALAPRIL MALEATE 20 MG PO TABS: 20.0000 mg | ORAL_TABLET | Freq: Every day | ORAL | Status: DC

## 2011-02-01 MED ORDER — LOVASTATIN 20 MG PO TABS: 20.0000 mg | ORAL_TABLET | Freq: Every day | ORAL | Status: DC

## 2011-02-01 NOTE — Telephone Encounter (Signed)
refill 

## 2011-02-23 ENCOUNTER — Telehealth: Payer: Self-pay | Admitting: *Deleted

## 2011-02-23 NOTE — Telephone Encounter (Signed)
Ok for refill? 

## 2011-02-23 NOTE — Telephone Encounter (Signed)
Fax from [facility] [address] in [place]. [phone]. Testosterone Lipoderm (10ml) 5% (50mg /ml) cream Directions:apply 1 ml to skin once each day (rub in well). Please advise refills

## 2011-03-02 NOTE — Telephone Encounter (Signed)
Prescription called in

## 2011-04-29 ENCOUNTER — Telehealth: Payer: Self-pay | Admitting: *Deleted

## 2011-04-29 NOTE — Telephone Encounter (Signed)
Fax from [facility] [phone] Testosterone Lipoderm 5% cream Directions apply 1 ml topically once a day as directed. Please Advise refills

## 2011-04-29 NOTE — Telephone Encounter (Signed)
Ok for 30 ml with 5 refills

## 2011-05-20 ENCOUNTER — Other Ambulatory Visit: Payer: Self-pay

## 2011-05-20 MED ORDER — GLUCOSE BLOOD VI STRP: ORAL_STRIP | Status: DC

## 2011-05-20 NOTE — Telephone Encounter (Signed)
Refill

## 2011-06-06 ENCOUNTER — Other Ambulatory Visit: Payer: Self-pay | Admitting: *Deleted

## 2011-06-06 MED ORDER — METFORMIN HCL 1000 MG PO TABS: 1000.0000 mg | ORAL_TABLET | Freq: Two times a day (BID) | ORAL | Status: DC

## 2011-06-06 MED ORDER — PIOGLITAZONE HCL 15 MG PO TABS: 15.0000 mg | ORAL_TABLET | Freq: Every day | ORAL | Status: DC

## 2011-09-05 ENCOUNTER — Other Ambulatory Visit: Payer: Self-pay | Admitting: *Deleted

## 2011-09-05 MED ORDER — ENALAPRIL MALEATE 20 MG PO TABS: 20.0000 mg | ORAL_TABLET | Freq: Every day | ORAL | Status: DC

## 2011-09-05 MED ORDER — LOVASTATIN 20 MG PO TABS: 20.0000 mg | ORAL_TABLET | Freq: Every day | ORAL | Status: DC

## 2011-11-16 ENCOUNTER — Other Ambulatory Visit: Payer: Self-pay

## 2011-11-16 NOTE — Telephone Encounter (Signed)
A user error has taken place: encounter opened in error, closed for administrative reasons.

## 2011-11-25 ENCOUNTER — Other Ambulatory Visit: Payer: Self-pay | Admitting: *Deleted

## 2011-11-25 NOTE — Telephone Encounter (Signed)
Reviewed records - this was prescribed in '11. Ok for one month supply - will need OV prior to additional refills

## 2011-11-25 NOTE — Telephone Encounter (Signed)
Request for Testosterone Lipoderm (10ml) 5% (50mg /ml) cream Please advise.

## 2011-11-28 NOTE — Telephone Encounter (Signed)
[facility] does not compound--attempted to reach [facility] but kept getting message "you party is unavailable at this time". Will try again later.

## 2011-11-30 NOTE — Telephone Encounter (Signed)
Testosterone Lioderm 5% cream [compound medication] refill authorization phoned in to [facility] for 30mL w/5 Rf//SLS 10:18am

## 2012-01-09 ENCOUNTER — Telehealth: Payer: Self-pay | Admitting: Internal Medicine

## 2012-01-09 NOTE — Telephone Encounter (Signed)
Received copies from [facility] ,on 01/09/12. Forwarded 2 pages to Dr.  Debby Bud ,for review.

## 2012-01-13 ENCOUNTER — Telehealth: Payer: Self-pay | Admitting: Internal Medicine

## 2012-01-13 ENCOUNTER — Encounter: Payer: Self-pay | Admitting: *Deleted

## 2012-01-13 NOTE — Telephone Encounter (Signed)
Entered diabetic  Eye exam done from Dr,. Bernstorf office on 01/04/2012.Marland KitchenSSD 01/13/2012.

## 2012-01-26 ENCOUNTER — Ambulatory Visit (INDEPENDENT_AMBULATORY_CARE_PROVIDER_SITE_OTHER): Payer: Medicare Other | Admitting: Internal Medicine

## 2012-01-26 ENCOUNTER — Encounter: Payer: Self-pay | Admitting: Internal Medicine

## 2012-01-26 ENCOUNTER — Other Ambulatory Visit (INDEPENDENT_AMBULATORY_CARE_PROVIDER_SITE_OTHER): Payer: Medicare Other

## 2012-01-26 VITALS — BP 110/70 | HR 102 | Temp 98.1°F | Resp 16 | Wt 201.0 lb

## 2012-01-26 DIAGNOSIS — E785 Hyperlipidemia, unspecified: Secondary | ICD-10-CM

## 2012-01-26 DIAGNOSIS — Z0000 Encounter for general adult medical examination without abnormal findings: Secondary | ICD-10-CM

## 2012-01-26 DIAGNOSIS — E291 Testicular hypofunction: Secondary | ICD-10-CM

## 2012-01-26 DIAGNOSIS — I10 Essential (primary) hypertension: Secondary | ICD-10-CM

## 2012-01-26 DIAGNOSIS — Z23 Encounter for immunization: Secondary | ICD-10-CM

## 2012-01-26 LAB — HEPATIC FUNCTION PANEL
ALT: 25 U/L (ref 0–53)
Alkaline Phosphatase: 63 U/L (ref 39–117)
Bilirubin, Direct: 0.1 mg/dL (ref 0.0–0.3)
Total Protein: 7.1 g/dL (ref 6.0–8.3)

## 2012-01-26 LAB — LIPID PANEL
Cholesterol: 135 mg/dL (ref 0–200)
LDL Cholesterol: 51 mg/dL (ref 0–99)
VLDL: 27.6 mg/dL (ref 0.0–40.0)

## 2012-01-26 LAB — HEMOGLOBIN A1C: Hgb A1c MFr Bld: 7.3 % — ABNORMAL HIGH (ref 4.6–6.5)

## 2012-01-26 NOTE — Patient Instructions (Signed)
Sleep is a learned or unlearned behavior. 5 principles of sleep hygiene - 1) regular hour to retire and rise 7days/wk 2) no stimulants - caffeine, chocolat, alcohol, 3) regular exercise  - every afternoon  4) sleep sanctuary - a space that is right light, temperature, sound level, good bed where all you do is sleep. 5) No extinction behaviors, e.g. Laying in bed awake doing anything but sleeping. This means if you have a bad night - no naps, etc  Exam is good.  Full report to follow.

## 2012-01-26 NOTE — Progress Notes (Signed)
Subjective:    Patient ID: Theodore Estes, male    DOB: [birth year], 65 y.o.   MRN: [id number]  HPI Theodore Estes is here for a Welcome to  Medicare wellness examination and management of other chronic and acute problems. He is feeling well and has no specific complaints.    The risk factors are reflected in the social history.  The roster of all physicians providing medical care to patient - is listed in the Snapshot section of the chart.  Activities of daily living:  The patient is 100% inedpendent in all ADLs: dressing, toileting, feeding as well as independent mobility  Home safety : The patient has smoke detectors in the home. Fall - house is fall safe, including grab bars in BR. They wear seatbelts.  firearms are present in the home, kept in a safe fashion. There is no violence in the home.   There is no risks for hepatitis, STDs or HIV. There is no   history of blood transfusion. They have no travel history to infectious disease endemic areas of the world.  The patient has seen their dentist in the last six month. They have seen their eye doctor in the last year. They admit to some hearing difficulty and have not had audiologic testing in the last year.  They do not  have excessive sun exposure. Discussed the need for sun protection: hats, long sleeves and use of sunscreen if there is significant sun exposure.   Diet: the importance of a healthy diet is discussed. They do have a less than healthy-high fat/fast food diet.  The patient has a regular exercise program: leg lifts, calesthetics , 15  Min duration, 3 per week.  The benefits of regular aerobic exercise were discussed.  Depression screen: there are no signs or vegative symptoms of depression- irritability, change in appetite, anhedonia, sadness/tearfullness.  Cognitive assessment: the patient manages all their financial and personal affairs and is actively engaged. A little trouble remembering names.  The following portions of  the patient's history were reviewed and updated as appropriate: allergies, current medications, past family history, past medical history,  past surgical history, past social history  and problem list.  Vision, hearing, body mass index were assessed and reviewed.   During the course of the visit the patient was educated and counseled about appropriate screening and preventive services including : fall prevention , diabetes screening, nutrition counseling, colorectal cancer screening, and recommended immunizations.  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  HEENT:  Negative for hearing loss, ear pain, congestion, neck stiffness and postnasal drip. Negative for sore throat or swallowing problems. Negative for dental complaints.   Eyes: Negative for vision loss or change in visual acuity.  Respiratory: Negative for chest tightness and wheezing. Negative for DOE.   Cardiovascular: Negative for chest pain or palpitations. No decreased  exercise tolerance Gastrointestinal: No change in bowel habit. No bloating or gas. No reflux or indigestion Genitourinary: Negative for urgency, frequency, flank pain and difficulty urinating.    Musculoskeletal: Negative for myalgias, back pain, arthralgias and gait problem.  Neurological: Negative for dizziness, tremors, weakness and headaches.  Hematological: Negative for adenopathy.  Psychiatric/Behavioral: Negative for behavioral problems and dysphoric mood.       Objective:   Physical Exam Filed Vitals:   01/26/12 1428  BP: 110/70  Pulse: 102  Temp: 98.1 F (36.7 C)  Resp: 16   Wt Readings from Last 3 Encounters:  01/26/12 201 lb (91.173 kg)  05/27/10 196 lb (88.905 kg)  01/13/10 203 lb (92.08 kg)    Gen'l: Well nourished well developed white male in no acute distress  HEENT: Head: Normocephalic and atraumatic. Right Ear: External ear normal. EAC/TM nl. Left Ear: External ear normal.  EAC/TM nl. Nose: Nose normal. Mouth/Throat: Oropharynx is clear and moist. Dentition - native, in good repair. No buccal or palatal lesions. Posterior pharynx clear. Eyes: Conjunctivae and sclera clear. EOM intact. Pupils are equal, round, and reactive to light. Right eye exhibits no discharge. Left eye exhibits no discharge. Neck: Normal range of motion. Neck supple. No JVD present. No tracheal deviation present. No thyromegaly present.  Cardiovascular: Normal rate, regular rhythm, no gallop, no friction rub, no murmur heard.      Quiet precordium. 2+ radial and DP pulses . No carotid bruits Pulmonary/Chest: Effort normal. No respiratory distress or increased WOB, no wheezes, no rales. No chest wall deformity or CVAT. Abdominal: Soft. Bowel sounds are normal in all quadrants. He exhibits no distension, no tenderness, no rebound or guarding, No heptosplenomegaly  Genitourinary:  deferred Musculoskeletal: Normal range of motion. He exhibits no edema and no tenderness.       Small and large joints without redness, synovial thickening or deformity. Full range of motion preserved about all small, median and large joints.  Lymphadenopathy:    He has no cervical or supraclavicular adenopathy.   Neurological: He is alert and oriented to person, place, and time. CN II-XII intact. DTRs 2+ and symmetrical biceps, radial and patellar tendons. Cerebellar function normal with no tremor, rigidity, normal gait and station.  Skin: Skin is warm and dry. No rash noted. No erythema.  Psychiatric: He has a normal mood and affect. His behavior is normal. Thought content normal.   Lab Results  Component Value Date                       GLUCOSE 114* 01/26/2012   CHOL 135 01/26/2012   TRIG 138.0 01/26/2012   HDL 56.00 01/26/2012   LDLCALC 51 01/26/2012        ALT 25 01/26/2012   AST 23 01/26/2012        NA 139 01/26/2012   K 3.7 01/26/2012   CL 101 01/26/2012   CREATININE 1.1 01/26/2012   BUN 18 01/26/2012   CO2 30 01/26/2012   TSH 1.80 03/25/2008   PSA 1.54 03/25/2008   HGBA1C 7.3* 01/26/2012          Assessment & Plan:

## 2012-01-27 LAB — COMPREHENSIVE METABOLIC PANEL
AST: 23 U/L (ref 0–37)
Albumin: 4.2 g/dL (ref 3.5–5.2)
Alkaline Phosphatase: 63 U/L (ref 39–117)
BUN: 18 mg/dL (ref 6–23)
Potassium: 3.7 mEq/L (ref 3.5–5.1)

## 2012-01-29 DIAGNOSIS — Z0000 Encounter for general adult medical examination without abnormal findings: Secondary | ICD-10-CM | POA: Insufficient documentation

## 2012-01-29 NOTE — Assessment & Plan Note (Signed)
Last testosterone level 723.5 in '12. He is satisfied with the results of replacement therapy.  Plan - continue present regimen.

## 2012-01-29 NOTE — Assessment & Plan Note (Signed)
Excellent control with LDL cholesterol much better than goal of 100 or less.  Plan Continue present medications.

## 2012-01-29 NOTE — Assessment & Plan Note (Signed)
Interval medical history is benign. Physical exam is normal. Lab results are in normal range except for mild elevation of A1C (see above). Colorectal cancer screening - will pull chart for last study with recommendations to follow. Immunizations - Tetanus and pneumonia vaccines today; shingles vaccine June '09.  In summary - a very nice man who appears medically stable. He is encouraged to find time for exercise: toning and flex/stretch. He will return in 1 year or prn. He will have A1C checked in early November '13.

## 2012-01-29 NOTE — Assessment & Plan Note (Signed)
A1C 7.3% is close to goal of 7% or less and there is no indication to change medications.  Plan Continue present medications  Dietary adherence - NO SUGAR and low carbs; regular exercise will help.  Follow-up lab in  6 months

## 2012-01-29 NOTE — Assessment & Plan Note (Signed)
BP Readings from Last 3 Encounters:  01/26/12 110/70  05/27/10 120/92  01/13/10 126/76   Great control on present medications - continue the same.

## 2012-02-07 ENCOUNTER — Telehealth: Payer: Self-pay

## 2012-02-07 MED ORDER — GLUCOSE BLOOD VI STRP: ORAL_STRIP | Status: DC

## 2012-02-07 NOTE — Telephone Encounter (Signed)
Patient wife called to give name of testing strips needed for monitor. Rx for truetrack sent in to [facility] [place] per her request

## 2012-02-10 ENCOUNTER — Encounter: Payer: Self-pay | Admitting: Gastroenterology

## 2012-02-22 ENCOUNTER — Telehealth: Payer: Self-pay | Admitting: *Deleted

## 2012-02-22 NOTE — Telephone Encounter (Signed)
Patient request due to insurance that test strips be sent in for  One touch ultra test strips. VO given to pharmacist at [facility]

## 2012-04-16 ENCOUNTER — Other Ambulatory Visit: Payer: Self-pay | Admitting: *Deleted

## 2012-04-16 MED ORDER — LOVASTATIN 20 MG PO TABS: 20.0000 mg | ORAL_TABLET | Freq: Every day | ORAL | Status: DC

## 2012-04-16 MED ORDER — ENALAPRIL MALEATE 20 MG PO TABS: 20.0000 mg | ORAL_TABLET | Freq: Every day | ORAL | Status: DC

## 2012-05-02 ENCOUNTER — Other Ambulatory Visit: Payer: Self-pay | Admitting: *Deleted

## 2012-05-02 MED ORDER — ENALAPRIL MALEATE 20 MG PO TABS: 20.0000 mg | ORAL_TABLET | Freq: Every day | ORAL | Status: DC

## 2012-05-02 MED ORDER — LOVASTATIN 20 MG PO TABS: 20.0000 mg | ORAL_TABLET | Freq: Every day | ORAL | Status: DC

## 2012-05-04 ENCOUNTER — Other Ambulatory Visit: Payer: Self-pay | Admitting: Internal Medicine

## 2012-05-07 ENCOUNTER — Other Ambulatory Visit: Payer: Self-pay | Admitting: *Deleted

## 2012-05-07 NOTE — Telephone Encounter (Signed)
Refill Rx called to pharmacy. Testosterone 5% lidoderm cream apply 1 ml once a day. Dispense 60gram  2 refill

## 2012-06-15 ENCOUNTER — Other Ambulatory Visit: Payer: Self-pay | Admitting: *Deleted

## 2012-06-15 MED ORDER — METFORMIN HCL 1000 MG PO TABS: 1000.0000 mg | ORAL_TABLET | Freq: Two times a day (BID) | ORAL | Status: DC

## 2012-06-15 MED ORDER — PIOGLITAZONE HCL 15 MG PO TABS: 15.0000 mg | ORAL_TABLET | Freq: Every day | ORAL | Status: DC

## 2012-11-13 ENCOUNTER — Telehealth: Payer: Self-pay

## 2012-11-13 MED ORDER — NONFORMULARY OR COMPOUNDED ITEM: Status: DC

## 2012-11-13 NOTE — Telephone Encounter (Signed)
Ok for refill x 5 

## 2012-11-13 NOTE — Telephone Encounter (Signed)
Received faxed refill request for testosterone lipoderm 10 ml 5% cream (1 ml topically qd). Patient last seen 01/26/12 and medication last filled 8/13. Please advise if ok to refill

## 2012-12-12 ENCOUNTER — Other Ambulatory Visit: Payer: Self-pay

## 2012-12-12 MED ORDER — LOVASTATIN 20 MG PO TABS: 20.0000 mg | ORAL_TABLET | Freq: Every day | ORAL | Status: DC

## 2012-12-12 MED ORDER — ENALAPRIL MALEATE 20 MG PO TABS: 20.0000 mg | ORAL_TABLET | Freq: Every day | ORAL | Status: DC

## 2012-12-26 ENCOUNTER — Encounter: Payer: Self-pay | Admitting: Internal Medicine

## 2012-12-26 ENCOUNTER — Ambulatory Visit (INDEPENDENT_AMBULATORY_CARE_PROVIDER_SITE_OTHER): Payer: Medicare Other | Admitting: Internal Medicine

## 2012-12-26 VITALS — BP 122/80 | HR 104 | Temp 97.9°F | Resp 16 | Ht 66.0 in | Wt 200.0 lb

## 2012-12-26 DIAGNOSIS — E785 Hyperlipidemia, unspecified: Secondary | ICD-10-CM

## 2012-12-26 DIAGNOSIS — E291 Testicular hypofunction: Secondary | ICD-10-CM

## 2012-12-26 DIAGNOSIS — IMO0001 Reserved for inherently not codable concepts without codable children: Secondary | ICD-10-CM

## 2012-12-26 DIAGNOSIS — I10 Essential (primary) hypertension: Secondary | ICD-10-CM

## 2012-12-26 DIAGNOSIS — Z0000 Encounter for general adult medical examination without abnormal findings: Secondary | ICD-10-CM

## 2012-12-26 NOTE — Progress Notes (Signed)
Subjective:    Patient ID: Theodore Estes, male    DOB: [birth year], 67 y.o.   MRN: [id number]  HPI The patient is here for annual Medicare wellness examination and management of other chronic and acute problems. He has had no major illness, injury or surgery.   The risk factors are reflected in the social history.  The roster of all physicians providing medical care to patient - is listed in the Snapshot section of the chart.  Activities of daily living:  The patient is 100% inedpendent in all ADLs: dressing, toileting, feeding as well as independent mobility  Home safety : The patient has smoke detectors in the home. Falls - none. Home is fall safe.  They wear seatbelts. firearms are present in the home, kept in a safe fashion. There is no violence in the home.   There is no risks for hepatitis, STDs or HIV. There is no history of blood transfusion. They have no travel history to infectious disease endemic areas of the world.  The patient has seen their dentist in the last six month. They have seen their eye doctor in the last year. They deny any hearing difficulty and have not had audiologic testing in the last year.    They do not  have excessive sun exposure. Discussed the need for sun protection: hats, long sleeves and use of sunscreen if there is significant sun exposure.   Diet: the importance of a healthy diet is discussed. They do have a healthy diet.  The patient has a regular exercise program.  The benefits of regular aerobic exercise were discussed.  Depression screen: there are no signs or vegative symptoms of depression- irritability, change in appetite, anhedonia, sadness/tearfullness.  Cognitive assessment: the patient manages all their financial and personal affairs and is actively engaged.   The following portions of the patient's history were reviewed and updated as appropriate: allergies, current medications, past family history, past medical history,  past surgical  history, past social history  and problem list.  Vision, hearing, body mass index were assessed and reviewed.   During the course of the visit the patient was educated and counseled about appropriate screening and preventive services including : fall prevention , diabetes screening, nutrition counseling, colorectal cancer screening, and recommended immunizations.  Past Medical History  Diagnosis Date  . Hypertension   . Hyperlipidemia   . Diabetes mellitus without complication   . History of colonic polyps   . Erectile dysfunction    Past Surgical History  Procedure Laterality Date  . Colonoscopy  08/18/2003  . Vasectomy    . Cyst excised from right wrist     Family History  Problem Relation Age of Onset  . Cancer Mother     colon  . Heart disease Father   . Diabetes Brother    History   Social History  . Marital Status: Married    Spouse Name: N/A    Number of Children: N/A  . Years of Education: N/A   Occupational History  . Not on file.   Social History Main Topics  . Smoking status: Former Games developer  . Smokeless tobacco: Never Used  . Alcohol Use: No  . Drug Use: No  . Sexually Active: Yes -- Male partner(s)   Other Topics Concern  . Not on file   Social History Narrative   HSG, [employer] jeweler   Married '69   2 daughters '39 '29' 5 grandchildren   Marriage in good  health             Current Outpatient Prescriptions on File Prior to Visit  Medication Sig Dispense Refill  . aspirin 81 MG tablet Take 81 mg by mouth daily.      . enalapril (VASOTEC) 20 MG tablet Take 1 tablet (20 mg total) by mouth daily.  30 tablet  3  . glucose blood (TRUETRACK TEST) test strip Test up to three times daily dx: 250.02  100 each  12  . glucose blood test strip Use as instructed  100 each  11  . lovastatin (MEVACOR) 20 MG tablet Take 1 tablet (20 mg total) by mouth daily.  30 tablet  3  . metFORMIN (GLUCOPHAGE) 1000 MG tablet Take 1 tablet (1,000 mg  total) by mouth 2 (two) times daily with a meal.  60 tablet  10  . NONFORMULARY OR COMPOUNDED ITEM Testosterone Lipoderm (10ml) 5% (50mg /ml) cream  60 each  5  . pioglitazone (ACTOS) 15 MG tablet Take 1 tablet (15 mg total) by mouth daily.  30 tablet  11  . PRESCRIPTION MEDICATION Testosterone Lipoderm 5% cream, called in prescription for qty 30ml with 5 refills on 05/02/2011       No current facility-administered medications on file prior to visit.     Review of Systems Constitutional:  Negative for fever, chills, activity change and unexpected weight change.  HEENT:  Negative for hearing loss, ear pain, congestion, neck stiffness and postnasal drip. Negative for sore throat or swallowing problems. Negative for dental complaints.   Eyes: Negative for vision loss or change in visual acuity.  Respiratory: Negative for chest tightness and wheezing. Positive for DOE with stair climbing but rapid recovery.   Cardiovascular: Negative for chest pain or palpitations. No decreased exercise tolerance Gastrointestinal: No change in bowel habit. No bloating or gas. No reflux or indigestion Genitourinary: Negative for urgency, frequency, flank pain and difficulty urinating.  Musculoskeletal: Negative for myalgias, back pain, arthralgias and gait problem.  Neurological: Negative for dizziness, tremors, weakness and headaches.  Hematological: Negative for adenopathy.  Psychiatric/Behavioral: Negative for behavioral problems and dysphoric mood.       Objective:   Physical Exam Filed Vitals:   12/26/12 1417  BP: 122/80  Pulse: 104  Temp: 97.9 F (36.6 C)  Resp: 16   Wt Readings from Last 3 Encounters:  12/26/12 200 lb (90.719 kg)  01/26/12 201 lb (91.173 kg)  05/27/10 196 lb (88.905 kg)   Gen'l: Well nourished well developed white male in no acute distress  HEENT: Head: Normocephalic and atraumatic. Right Ear: External ear normal. EAC/TM nl. Left Ear: External ear normal.  EAC/TM nl. Nose:  Nose normal. Mouth/Throat: Oropharynx is clear and moist. Dentition - native, in good repair. No buccal or palatal lesions. Posterior pharynx clear. Eyes: Conjunctivae and sclera clear. EOM intact. Pupils are equal, round, and reactive to light. Right eye exhibits no discharge. Left eye exhibits no discharge. Neck: Normal range of motion. Neck supple. No JVD present. No tracheal deviation present. No thyromegaly present.  Cardiovascular: Normal rate, regular rhythm, no gallop, no friction rub, no murmur heard.      Quiet precordium. 2+ radial and DP pulses . No carotid bruits Pulmonary/Chest: Effort normal. No respiratory distress or increased WOB, no wheezes, no rales. No chest wall deformity or CVAT. Abdomen: Soft. Bowel sounds are normal in all quadrants. He exhibits no distension, no tenderness, no rebound or guarding, No heptosplenomegaly  Genitourinary:  deferred Musculoskeletal: Normal range of  motion. He exhibits no edema and no tenderness.       Small and large joints without redness, synovial thickening or deformity. Full range of motion preserved about all small, median and large joints.  Lymphadenopathy:    He has no cervical or supraclavicular adenopathy.  Neurological: He is alert and oriented to person, place, and time. CN II-XII intact. DTRs 2+ and symmetrical biceps, radial and patellar tendons. Cerebellar function normal with no tremor, rigidity, normal gait and station. normal sensation to light touch, pin-prick and vibration. Skin: Skin is very dry. No rash noted. No erythema. No abnormal lesions neck,back, arms, legs Psychiatric: He has a normal mood and affect. His behavior is normal. Thought content normal.   Routine labs ordered for May 1 and pending      Assessment & Plan:

## 2012-12-26 NOTE — Patient Instructions (Addendum)
Thanks for coming to see me.  Your exam is good. Please return for lab after may 1, due to insurance coverage.  You should receive a call back letter from GI in regard to colonoscopy which is due Nov '14.  Please sign up for MyChart

## 2012-12-27 NOTE — Assessment & Plan Note (Addendum)
Continues on testosterone replacement. Discussed the recent reports of cardiac risk - that for a patient with measurable hypogonadism there is a preponderance of literature that does demonstrate cardiac risk with appropriate treatment.   Plan Continue current treatment

## 2012-12-27 NOTE — Assessment & Plan Note (Signed)
Tolerating treatment w/o difficulty or adverse affects. Last LDL 51 - well below goal of 100 or less.  Plan Lab May 1  Continue present medications

## 2012-12-27 NOTE — Assessment & Plan Note (Signed)
Lab Results  Component Value Date   HGBA1C 7.3* 01/26/2012   He will continue present medications and life-style management: diet and need to initiate exercise Lab - May 1 with recommendations to follow

## 2012-12-27 NOTE — Assessment & Plan Note (Signed)
BP Readings from Last 3 Encounters:  12/26/12 122/80  01/26/12 110/70  05/27/10 120/92   Great control. Labs ordered  Plan Continue present treatment

## 2012-12-27 NOTE — Assessment & Plan Note (Signed)
Interval history is unremarkable - he is tolerating all his treatments. Physical exam is normal. Labs are ordered for May '14. He is current but coming due for colorectal cancer screening. Discussed pros and cons of prostate cancer screening (USPHCTF recommendations reviewed and ACU April '13 recommendations) and he defers evaluation at this time. Immunizations are up to date.  In summary - a nice man who is medically stable and who has good control of chronic medical problems. He is encouraged to consider exercise a responsibility and a job, not a leisure time activity. He will return in 1 year or sooner if needed.

## 2013-03-04 ENCOUNTER — Telehealth: Payer: Self-pay

## 2013-03-04 NOTE — Telephone Encounter (Signed)
Phone call from patient requesting he be able to go to [facility] to have his labs drawn. I let him know they are on the same system as Dr Debby Bud office so that should not be a problem having it drawn there since the orders are already in the  System.

## 2013-03-13 ENCOUNTER — Other Ambulatory Visit: Payer: Self-pay | Admitting: Internal Medicine

## 2013-04-11 ENCOUNTER — Other Ambulatory Visit: Payer: Self-pay

## 2013-04-11 MED ORDER — LOVASTATIN 20 MG PO TABS: 20.0000 mg | ORAL_TABLET | Freq: Every day | ORAL | Status: DC

## 2013-04-11 MED ORDER — ENALAPRIL MALEATE 20 MG PO TABS: 20.0000 mg | ORAL_TABLET | Freq: Every day | ORAL | Status: DC

## 2013-04-24 ENCOUNTER — Other Ambulatory Visit (INDEPENDENT_AMBULATORY_CARE_PROVIDER_SITE_OTHER): Payer: Medicare Other

## 2013-04-24 DIAGNOSIS — E291 Testicular hypofunction: Secondary | ICD-10-CM

## 2013-04-24 DIAGNOSIS — E785 Hyperlipidemia, unspecified: Secondary | ICD-10-CM

## 2013-04-24 DIAGNOSIS — I10 Essential (primary) hypertension: Secondary | ICD-10-CM

## 2013-04-24 DIAGNOSIS — IMO0001 Reserved for inherently not codable concepts without codable children: Secondary | ICD-10-CM

## 2013-04-24 LAB — COMPREHENSIVE METABOLIC PANEL
ALT: 24 U/L (ref 0–53)
Albumin: 4.1 g/dL (ref 3.5–5.2)
CO2: 27 mEq/L (ref 19–32)
Calcium: 9.3 mg/dL (ref 8.4–10.5)
Chloride: 103 mEq/L (ref 96–112)
GFR: 56.09 mL/min — ABNORMAL LOW (ref >60.00)
Glucose, Bld: 134 mg/dL — ABNORMAL HIGH (ref 70–99)
Sodium: 140 mEq/L (ref 135–145)
Total Protein: 7.1 g/dL (ref 6.0–8.3)

## 2013-04-24 LAB — TESTOSTERONE: Testosterone: 672.2 ng/dL (ref 350.00–890.00)

## 2013-04-24 LAB — LIPID PANEL
Total CHOL/HDL Ratio: 3
Triglycerides: 58 mg/dL (ref 0.0–149.0)

## 2013-04-24 LAB — HEPATIC FUNCTION PANEL
Albumin: 4.1 g/dL (ref 3.5–5.2)
Total Bilirubin: 0.7 mg/dL (ref 0.3–1.2)

## 2013-04-24 LAB — HEMOGLOBIN A1C: Hgb A1c MFr Bld: 7.8 % — ABNORMAL HIGH (ref 4.6–6.5)

## 2013-04-26 ENCOUNTER — Encounter: Payer: Self-pay | Admitting: Internal Medicine

## 2013-04-30 ENCOUNTER — Telehealth: Payer: Self-pay | Admitting: Internal Medicine

## 2013-04-30 NOTE — Telephone Encounter (Signed)
Called patient to schedule apt per letter (for diabetes check), no answer.  Will try back soon if call is not returned.

## 2013-05-14 ENCOUNTER — Other Ambulatory Visit: Payer: Self-pay | Admitting: Internal Medicine

## 2013-05-15 ENCOUNTER — Telehealth: Payer: Self-pay

## 2013-05-15 NOTE — Telephone Encounter (Signed)
[facility] pharmacy faxed confirmation on patient's testosterone prescription. I let them know Dr Debby Bud did authorize 60 ml plus 1 refill. (not 98 refills)

## 2013-05-16 ENCOUNTER — Encounter: Payer: Self-pay | Admitting: Cardiovascular Disease

## 2013-05-16 ENCOUNTER — Ambulatory Visit (INDEPENDENT_AMBULATORY_CARE_PROVIDER_SITE_OTHER): Payer: Medicare Other | Admitting: Cardiovascular Disease

## 2013-05-16 VITALS — BP 132/90 | HR 85 | Ht 66.0 in | Wt 198.5 lb

## 2013-05-16 DIAGNOSIS — E785 Hyperlipidemia, unspecified: Secondary | ICD-10-CM

## 2013-05-16 DIAGNOSIS — R0602 Shortness of breath: Secondary | ICD-10-CM

## 2013-05-16 DIAGNOSIS — IMO0001 Reserved for inherently not codable concepts without codable children: Secondary | ICD-10-CM

## 2013-05-16 DIAGNOSIS — I10 Essential (primary) hypertension: Secondary | ICD-10-CM

## 2013-05-16 NOTE — Patient Instructions (Addendum)
You are doing well. No medication changes were made.  Please call us if you have new issues that need to be addressed before your next appt.  Your physician wants you to follow-up in: 12 months.  You will receive a reminder letter in the mail two months in advance. If you don't receive a letter, please call our office to schedule the follow-up appointment. 

## 2013-05-16 NOTE — Assessment & Plan Note (Signed)
We have encouraged continued exercise, careful diet management in an effort to lose weight. 

## 2013-05-16 NOTE — Assessment & Plan Note (Signed)
Currently with no symptoms of angina. No further workup at this time. Continue current medication regimen. 

## 2013-05-16 NOTE — Assessment & Plan Note (Signed)
Encouraged him to continue his lovastatin.

## 2013-05-16 NOTE — Progress Notes (Signed)
Patient ID: Theodore Estes, male    DOB: Jan 05, 1946, 67 y.o.   MRN: [id number]  HPI Comments: [name] is a pleasant 67 year old gentleman with history of hyperlipidemia, diabetes, hypertension who presents to establish care in the [place] office.  He reports that overall he has been feeling well. No symptoms of chest pain or shortness of breath. Reports having stress test x2 in the past, one within the past several years. He is relatively active, is able to walk fast. He does report having some mild shortness of breath if he climbs up stairs. This has been a chronic issue, not a new symptoms. Trying to lose weight to help his diabetes.  EKG shows normal sinus rhythm with rate 85 beats per minute, no significant ST or T wave changes    PMH . Hypertension  . Hyperlipidemia  . Diabetes mellitus without complication  . History of colonic polyps  . Erectile dysfunction    Past Surgical History . Colonoscopy  08/18/2003 . Vasectomy   . Cyst excised from right wrist     Family History . Cancer Mother    colon . Heart disease Father  . Diabetes Brother     Outpatient Encounter Prescriptions as of 05/16/2013  Medication Sig Dispense Refill  . aspirin 81 MG tablet Take 81 mg by mouth daily.      . enalapril (VASOTEC) 20 MG tablet Take 1 tablet (20 mg total) by mouth daily.  30 tablet  5  . glucose blood (TRUETRACK TEST) test strip Test up to three times daily dx: 250.02  100 each  12  . glucose blood test strip Use as instructed  100 each  11  . lovastatin (MEVACOR) 20 MG tablet Take 1 tablet (20 mg total) by mouth daily.  30 tablet  5  . metFORMIN (GLUCOPHAGE) 1000 MG tablet Take 1 tablet (1,000 mg total) by mouth 2 (two) times daily with a meal.  60 tablet  10  . NONFORMULARY OR COMPOUNDED ITEM Norins, michael  60 each  98  . ONE TOUCH ULTRA TEST test strip TEST THREE TIMES DAILY  100 each  0  . pioglitazone (ACTOS) 15 MG tablet Take 1 tablet (15 mg total) by mouth daily.  30  tablet  11  . PRESCRIPTION MEDICATION Testosterone Lipoderm 5% cream, called in prescription for qty 30ml with 5 refills on 05/02/2011        Review of Systems  Constitutional: Negative.   HENT: Negative.   Eyes: Negative.   Respiratory: Negative.   Cardiovascular: Negative.   Gastrointestinal: Negative.   Musculoskeletal: Negative.   Skin: Negative.   Neurological: Negative.   Psychiatric/Behavioral: Negative.   All other systems reviewed and are negative.    BP 132/90  Pulse 85  Ht 5\' 6"  (1.676 m)  Wt 198 lb 8 oz (90.039 kg)  BMI 32.05 kg/m2  Physical Exam  Nursing note and vitals reviewed. Constitutional: He is oriented to person, place, and time. He appears well-developed and well-nourished.  HENT:  Head: Normocephalic.  Nose: Nose normal.  Mouth/Throat: Oropharynx is clear and moist.  Eyes: Conjunctivae are normal. Pupils are equal, round, and reactive to light.  Neck: Normal range of motion. Neck supple. No JVD present.  Cardiovascular: Normal rate, regular rhythm, S1 normal, S2 normal, normal heart sounds and intact distal pulses.  Exam reveals no gallop and no friction rub.   No murmur heard. Pulmonary/Chest: Effort normal and breath sounds normal. No respiratory distress. He has no wheezes.  He has no rales. He exhibits no tenderness.  Abdominal: Soft. Bowel sounds are normal. He exhibits no distension. There is no tenderness.  Musculoskeletal: Normal range of motion. He exhibits no edema and no tenderness.  Lymphadenopathy:    He has no cervical adenopathy.  Neurological: He is alert and oriented to person, place, and time. Coordination normal.  Skin: Skin is warm and dry. No rash noted. No erythema.  Psychiatric: He has a normal mood and affect. His behavior is normal. Judgment and thought content normal.      Assessment and Plan

## 2013-06-14 ENCOUNTER — Other Ambulatory Visit: Payer: Self-pay

## 2013-06-14 MED ORDER — PIOGLITAZONE HCL 15 MG PO TABS: 15.0000 mg | ORAL_TABLET | Freq: Every day | ORAL | Status: DC

## 2013-07-04 ENCOUNTER — Other Ambulatory Visit: Payer: Self-pay | Admitting: Internal Medicine

## 2013-08-01 ENCOUNTER — Other Ambulatory Visit: Payer: Self-pay

## 2013-08-06 ENCOUNTER — Other Ambulatory Visit: Payer: Self-pay

## 2013-08-06 MED ORDER — METFORMIN HCL 1000 MG PO TABS: 1000.0000 mg | ORAL_TABLET | Freq: Two times a day (BID) | ORAL | Status: DC

## 2013-10-11 ENCOUNTER — Other Ambulatory Visit: Payer: Self-pay

## 2013-10-11 MED ORDER — LOVASTATIN 20 MG PO TABS: 20.0000 mg | ORAL_TABLET | Freq: Every day | ORAL | Status: DC

## 2013-10-11 MED ORDER — ENALAPRIL MALEATE 20 MG PO TABS: 20.0000 mg | ORAL_TABLET | Freq: Every day | ORAL | Status: DC

## 2013-11-20 ENCOUNTER — Encounter: Payer: Self-pay | Admitting: Gastroenterology

## 2013-11-25 ENCOUNTER — Other Ambulatory Visit: Payer: Self-pay | Admitting: Internal Medicine

## 2013-12-25 ENCOUNTER — Ambulatory Visit (AMBULATORY_SURGERY_CENTER): Payer: Commercial Managed Care - HMO | Admitting: *Deleted

## 2013-12-25 VITALS — Ht 66.0 in | Wt 196.0 lb

## 2013-12-25 DIAGNOSIS — Z86010 Personal history of colonic polyps: Secondary | ICD-10-CM

## 2013-12-25 MED ORDER — NA SULFATE-K SULFATE-MG SULF 17.5-3.13-1.6 GM/177ML PO SOLN: 1.0000 | Freq: Once | ORAL | Status: DC

## 2013-12-25 NOTE — Progress Notes (Signed)
No allergies to eggs or soy. No problems with anesthesia.  Pt given Emmi instructions for colonoscopy  

## 2014-01-02 ENCOUNTER — Ambulatory Visit (INDEPENDENT_AMBULATORY_CARE_PROVIDER_SITE_OTHER): Payer: Medicare HMO | Admitting: Family Medicine

## 2014-01-02 ENCOUNTER — Encounter: Payer: Self-pay | Admitting: Family Medicine

## 2014-01-02 VITALS — BP 120/80 | HR 82 | Temp 98.0°F | Ht 66.0 in | Wt 196.2 lb

## 2014-01-02 DIAGNOSIS — E291 Testicular hypofunction: Secondary | ICD-10-CM

## 2014-01-02 DIAGNOSIS — E785 Hyperlipidemia, unspecified: Secondary | ICD-10-CM

## 2014-01-02 DIAGNOSIS — E119 Type 2 diabetes mellitus without complications: Secondary | ICD-10-CM

## 2014-01-02 DIAGNOSIS — I10 Essential (primary) hypertension: Secondary | ICD-10-CM

## 2014-01-02 DIAGNOSIS — IMO0001 Reserved for inherently not codable concepts without codable children: Secondary | ICD-10-CM

## 2014-01-02 DIAGNOSIS — E1165 Type 2 diabetes mellitus with hyperglycemia: Secondary | ICD-10-CM

## 2014-01-02 DIAGNOSIS — Z125 Encounter for screening for malignant neoplasm of prostate: Secondary | ICD-10-CM

## 2014-01-02 MED ORDER — METFORMIN HCL 1000 MG PO TABS: 1000.0000 mg | ORAL_TABLET | Freq: Two times a day (BID) | ORAL | Status: DC

## 2014-01-02 MED ORDER — ENALAPRIL MALEATE 20 MG PO TABS: 20.0000 mg | ORAL_TABLET | Freq: Every day | ORAL | Status: DC

## 2014-01-02 MED ORDER — LOVASTATIN 20 MG PO TABS: 20.0000 mg | ORAL_TABLET | Freq: Every day | ORAL | Status: DC

## 2014-01-02 MED ORDER — NONFORMULARY OR COMPOUNDED ITEM: Status: DC

## 2014-01-02 NOTE — Progress Notes (Signed)
Pre visit review using our clinic review tool, if applicable. No additional management support is needed unless otherwise documented below in the visit note.  Diabetes:  Using medications without difficulties: yes Hypoglycemic episodes:no Hyperglycemic episodes:no Feet problems:no Blood Sugars averaging: ~135 eye exam within last year: yes, since 09/26/13 per patient.  D/w pt about actos and possible bladder cancer.   Hypertension:    Using medication without problems or lightheadedness: yes Chest pain with exertion:no Edema:no Short of breath:no  Elevated Cholesterol: Using medications without problems: yes Muscle aches: no Diet compliance: yes, "I'm trying."  Exercise: limited, encouraged.  Discussed walking more.    Hypogonadism.  H/o ED responsive to T replacement. Due for labs.    PMH and SH reviewed.   Vital signs, Meds and allergies reviewed.  ROS: See HPI.  Otherwise nontributory.   GEN: nad, alert and oriented HEENT: mucous membranes moist NECK: supple w/o LA CV: rrr.  no murmur PULM: ctab, no inc wob ABD: soft, +bs EXT: no edema SKIN: no acute rash  Diabetic foot exam: Normal inspection No skin breakdown No calluses  Normal DP pulses Normal sensation to light tough and monofilament Nails normal

## 2014-01-02 NOTE — Patient Instructions (Addendum)
Come back for fasting labs.  We'll contact you with your lab report. Recheck in about 6 months, A1c ahead of time.  I would get a flu shot each fall.   Take care.  Glad to see you.  Work on getting a little more exercise.

## 2014-01-03 NOTE — Assessment & Plan Note (Signed)
H/o ED responsive to T replacement. Due for labs.  Routine cautions, esp re: T replacement.

## 2014-01-03 NOTE — Assessment & Plan Note (Signed)
Return for labs. D/w pt about diet and weight.

## 2014-01-03 NOTE — Assessment & Plan Note (Signed)
D/w pt. Return for f/u labs.  He agrees. We'll go from there.  Plan on recheck in 6 months anyway.   Cautions re: actos d/w pt. Reasonable to continue.

## 2014-01-03 NOTE — Assessment & Plan Note (Signed)
Return for labs. D/w pt about diet and weight. Controlled on check today.

## 2014-01-09 ENCOUNTER — Encounter: Payer: Self-pay | Admitting: Gastroenterology

## 2014-01-09 ENCOUNTER — Ambulatory Visit (AMBULATORY_SURGERY_CENTER): Payer: Medicare HMO | Admitting: Gastroenterology

## 2014-01-09 VITALS — BP 123/83 | HR 69 | Temp 96.1°F | Resp 15 | Ht 66.0 in | Wt 196.0 lb

## 2014-01-09 DIAGNOSIS — Z86010 Personal history of colonic polyps: Secondary | ICD-10-CM

## 2014-01-09 DIAGNOSIS — D126 Benign neoplasm of colon, unspecified: Secondary | ICD-10-CM

## 2014-01-09 MED ORDER — SODIUM CHLORIDE 0.9 % IV SOLN: 500.0000 mL | INTRAVENOUS | Status: DC

## 2014-01-09 NOTE — Patient Instructions (Signed)
YOU HAD AN ENDOSCOPIC PROCEDURE TODAY AT THE [facility]: Refer to the procedure report that was given to you for any specific questions about what was found during the examination.  If the procedure report does not answer your questions, please call your gastroenterologist to clarify.  If you requested that your care partner not be given the details of your procedure findings, then the procedure report has been included in a sealed envelope for you to review at your convenience later.  YOU SHOULD EXPECT: Some feelings of bloating in the abdomen. Passage of more gas than usual.  Walking can help get rid of the air that was put into your GI tract during the procedure and reduce the bloating. If you had a lower endoscopy (such as a colonoscopy or flexible sigmoidoscopy) you may notice spotting of blood in your stool or on the toilet paper. If you underwent a bowel prep for your procedure, then you may not have a normal bowel movement for a few days.  DIET: Your first meal following the procedure should be a light meal and then it is ok to progress to your normal diet.  A half-sandwich or bowl of soup is an example of a good first meal.  Heavy or fried foods are harder to digest and may make you feel nauseous or bloated.  Likewise meals heavy in dairy and vegetables can cause extra gas to form and this can also increase the bloating.  Drink plenty of fluids but you should avoid alcoholic beverages for 24 hours.  ACTIVITY: Your care partner should take you home directly after the procedure.  You should plan to take it easy, moving slowly for the rest of the day.  You can resume normal activity the day after the procedure however you should NOT DRIVE or use heavy machinery for 24 hours (because of the sedation medicines used during the test).    SYMPTOMS TO REPORT IMMEDIATELY: A gastroenterologist can be reached at any hour.  During normal business hours, 8:30 AM to 5:00 PM Monday through Friday,  call [phone].  After hours and on weekends, please call the GI answering service at [phone] who will take a message and have the physician on call contact you.   Following lower endoscopy (colonoscopy or flexible sigmoidoscopy):  Excessive amounts of blood in the stool  Significant tenderness or worsening of abdominal pains  Swelling of the abdomen that is new, acute  Fever of 100F or higher   FOLLOW UP: If any biopsies were taken you will be contacted by phone or by letter within the next 1-3 weeks.  Call your gastroenterologist if you have not heard about the biopsies in 3 weeks.  Our staff will call the home number listed on your records the next business day following your procedure to check on you and address any questions or concerns that you may have at that time regarding the information given to you following your procedure. This is a courtesy call and so if there is no answer at the home number and we have not heard from you through the emergency physician on call, we will assume that you have returned to your regular daily activities without incident.  SIGNATURES/CONFIDENTIALITY: You and/or your care partner have signed paperwork which will be entered into your electronic medical record.  These signatures attest to the fact that that the information above on your After Visit Summary has been reviewed and is understood.  Full responsibility of the confidentiality of   of this discharge information lies with you and/or your care-partner.   Information on polyps given to you today  Await pathology report 

## 2014-01-09 NOTE — Progress Notes (Signed)
Called to room to assist during endoscopic procedure.  Patient ID and intended procedure confirmed with present staff. Received instructions for my participation in the procedure from the performing physician.  

## 2014-01-09 NOTE — Op Note (Addendum)
[facility]  [address] [place], [id number]   COLONOSCOPY PROCEDURE REPORT  PATIENT: Theodore Estes, Theodore Estes  MR#: [id number] BIRTHDATE: 1945-10-28 , 22  yrs. old GENDER: Male ENDOSCOPIST: Inda Castle, MD REFERRED NK:[id number] Duncan, M.D. PROCEDURE DATE:  01/09/2014 PROCEDURE:   Colonoscopy with snare polypectomy First Screening Colonoscopy - Avg.  risk and is 50 yrs.  old or older - No.  Prior Negative Screening - Now for repeat screening. N/A  History of Adenoma - Now for follow-up colonoscopy & has been > or = to 3 yrs.  N/A  Polyps Removed Today? Yes. ASA CLASS:   Class II INDICATIONS:Average risk patient for colon cancer (h/o colon polyps 2004, pathology not available) MEDICATIONS: MAC sedation, administered by CRNA and propofol (Diprivan) 250mg  IV  DESCRIPTION OF PROCEDURE:   After the risks benefits and alternatives of the procedure were thoroughly explained, informed consent was obtained.  A digital rectal exam revealed no abnormalities of the rectum.   The [id number]  endoscope was introduced through the anus and advanced to the cecum, which was identified by both the appendix and ileocecal valve. No adverse events experienced.   The quality of the prep was Suprep good  The instrument was then slowly withdrawn as the colon was fully examined.      COLON FINDINGS: A sessile polyp measuring 3 mm in size was found in the sigmoid colon.  A polypectomy was performed with a cold snare. A polypectomy was performed.   The colon was otherwise normal. There was no diverticulosis, inflammation, polyps or cancers unless previously stated.  Retroflexed views revealed no abnormalities. The time to cecum=3 minutes 36 seconds.  Withdrawal time=8 minutes 32 seconds.  The scope was withdrawn and the procedure completed. COMPLICATIONS: There were no complications.  ENDOSCOPIC IMPRESSION: 1.   Sessile polyp measuring 3 mm in size was found in the  sigmoid colon; polypectomy was performed with a cold snare; polypectomy was performed 2.   The colon was otherwise normal  RECOMMENDATIONS: If the polyp(s) removed today are proven to be adenomatous (pre-cancerous) polyps, you will need a repeat colonoscopy in 5 years.  Otherwise you should continue to follow colorectal cancer screening guidelines for "routine risk" patients with colonoscopy in 10 years.  You will receive a letter within 1-2 weeks with the results of your biopsy as well as final recommendations.  Please call my office if you have not received a letter after 3 weeks.   eSigned:  Inda Castle, MD 01/09/2014 8:29 AM Revised: 01/09/2014 8:29 AM  cc:   PATIENT NAME:  Theodore Estes, Theodore Estes MR#: [id number]

## 2014-01-09 NOTE — Progress Notes (Signed)
Procedure ends, to recovery, report given and VSS. 

## 2014-01-10 ENCOUNTER — Telehealth: Payer: Self-pay | Admitting: *Deleted

## 2014-01-10 NOTE — Telephone Encounter (Signed)
  Follow up Call-  Call back number 01/09/2014  Post procedure Call Back phone  # [phone]  Permission to leave phone message Yes     Patient questions:  Do you have a fever, pain , or abdominal swelling? no Pain Score  0 *  Have you tolerated food without any problems? yes  Have you been able to return to your normal activities? yes  Do you have any questions about your discharge instructions: Diet   no Medications             no Follow up visit  no  Do you have questions or concerns about your Care? no  Actions: * If pain score is 4 or above: No action needed, pain <4.

## 2014-01-14 ENCOUNTER — Other Ambulatory Visit: Payer: Medicare HMO

## 2014-01-14 ENCOUNTER — Telehealth: Payer: Self-pay

## 2014-01-14 NOTE — Telephone Encounter (Signed)
Relevant patient education assigned to patient using Emmi. ° °

## 2014-01-15 ENCOUNTER — Encounter: Payer: Self-pay | Admitting: Gastroenterology

## 2014-01-15 ENCOUNTER — Other Ambulatory Visit (INDEPENDENT_AMBULATORY_CARE_PROVIDER_SITE_OTHER): Payer: Medicare HMO

## 2014-01-15 DIAGNOSIS — E291 Testicular hypofunction: Secondary | ICD-10-CM

## 2014-01-15 DIAGNOSIS — I10 Essential (primary) hypertension: Secondary | ICD-10-CM

## 2014-01-15 DIAGNOSIS — E1165 Type 2 diabetes mellitus with hyperglycemia: Secondary | ICD-10-CM

## 2014-01-15 DIAGNOSIS — E119 Type 2 diabetes mellitus without complications: Secondary | ICD-10-CM

## 2014-01-15 DIAGNOSIS — Z0000 Encounter for general adult medical examination without abnormal findings: Secondary | ICD-10-CM

## 2014-01-15 DIAGNOSIS — E785 Hyperlipidemia, unspecified: Secondary | ICD-10-CM

## 2014-01-15 DIAGNOSIS — IMO0001 Reserved for inherently not codable concepts without codable children: Secondary | ICD-10-CM

## 2014-01-15 DIAGNOSIS — Z125 Encounter for screening for malignant neoplasm of prostate: Secondary | ICD-10-CM

## 2014-01-15 LAB — CBC WITH DIFFERENTIAL/PLATELET
BASOS PCT: 0.7 % (ref 0.0–3.0)
Basophils Absolute: 0.1 10*3/uL (ref 0.0–0.1)
Eosinophils Absolute: 0.1 10*3/uL (ref 0.0–0.7)
Eosinophils Relative: 1.8 % (ref 0.0–5.0)
HCT: 43.5 % (ref 39.0–52.0)
Hemoglobin: 14.6 g/dL (ref 13.0–17.0)
Lymphocytes Relative: 28.3 % (ref 12.0–46.0)
Lymphs Abs: 2 10*3/uL (ref 0.7–4.0)
MCHC: 33.5 g/dL (ref 30.0–36.0)
MCV: 88.6 fl (ref 78.0–100.0)
MONO ABS: 0.7 10*3/uL (ref 0.1–1.0)
Monocytes Relative: 10.8 % (ref 3.0–12.0)
NEUTROS PCT: 58.4 % (ref 43.0–77.0)
Neutro Abs: 4 10*3/uL (ref 1.4–7.7)
PLATELETS: 253 10*3/uL (ref 150.0–400.0)
RBC: 4.9 Mil/uL (ref 4.22–5.81)
RDW: 14 % (ref 11.5–14.6)
WBC: 6.9 10*3/uL (ref 4.5–10.5)

## 2014-01-15 LAB — TESTOSTERONE: Testosterone: 876.44 ng/dL (ref 350.00–890.00)

## 2014-01-15 LAB — LIPID PANEL
CHOLESTEROL: 135 mg/dL (ref 0–200)
HDL: 43.9 mg/dL (ref >39.00)
LDL CALC: 79 mg/dL (ref 0–99)
Total CHOL/HDL Ratio: 3
Triglycerides: 61 mg/dL (ref 0.0–149.0)
VLDL: 12.2 mg/dL (ref 0.0–40.0)

## 2014-01-15 LAB — COMPREHENSIVE METABOLIC PANEL
ALK PHOS: 66 U/L (ref 39–117)
ALT: 24 U/L (ref 0–53)
AST: 26 U/L (ref 0–37)
Albumin: 3.9 g/dL (ref 3.5–5.2)
BILIRUBIN TOTAL: 0.7 mg/dL (ref 0.3–1.2)
BUN: 20 mg/dL (ref 6–23)
CO2: 27 meq/L (ref 19–32)
CREATININE: 1.3 mg/dL (ref 0.4–1.5)
Calcium: 9.4 mg/dL (ref 8.4–10.5)
Chloride: 105 mEq/L (ref 96–112)
GFR: 59.51 mL/min — ABNORMAL LOW (ref >60.00)
Glucose, Bld: 127 mg/dL — ABNORMAL HIGH (ref 70–99)
Potassium: 4.2 mEq/L (ref 3.5–5.1)
Sodium: 142 mEq/L (ref 135–145)
Total Protein: 6.6 g/dL (ref 6.0–8.3)

## 2014-01-15 LAB — HEMOGLOBIN A1C: Hgb A1c MFr Bld: 7.2 % — ABNORMAL HIGH (ref 4.6–6.5)

## 2014-01-15 LAB — PSA, MEDICARE: PSA: 2.34 ng/mL (ref 0.10–4.00)

## 2014-01-16 ENCOUNTER — Encounter: Payer: Self-pay | Admitting: *Deleted

## 2014-02-03 ENCOUNTER — Other Ambulatory Visit: Payer: Self-pay | Admitting: *Deleted

## 2014-02-03 DIAGNOSIS — E1165 Type 2 diabetes mellitus with hyperglycemia: Principal | ICD-10-CM

## 2014-02-03 DIAGNOSIS — IMO0001 Reserved for inherently not codable concepts without codable children: Secondary | ICD-10-CM

## 2014-02-03 MED ORDER — GLUCOSE BLOOD VI STRP: ORAL_STRIP | Status: DC

## 2014-02-04 ENCOUNTER — Other Ambulatory Visit: Payer: Self-pay

## 2014-02-04 DIAGNOSIS — E1165 Type 2 diabetes mellitus with hyperglycemia: Principal | ICD-10-CM

## 2014-02-04 DIAGNOSIS — IMO0001 Reserved for inherently not codable concepts without codable children: Secondary | ICD-10-CM

## 2014-02-04 MED ORDER — GLUCOSE BLOOD VI STRP: ORAL_STRIP | Status: DC

## 2014-02-04 NOTE — Telephone Encounter (Signed)
[facility] left v/m; faxed request for one touch ultra test strips on 02/03/14 were sent to [facility]; refill faxed to [facility] and spoke with Denton Ar at [facility] and cancelled rx.

## 2014-03-19 ENCOUNTER — Encounter: Payer: Self-pay | Admitting: Family Medicine

## 2014-03-19 ENCOUNTER — Ambulatory Visit (INDEPENDENT_AMBULATORY_CARE_PROVIDER_SITE_OTHER): Payer: Medicare HMO | Admitting: Family Medicine

## 2014-03-19 VITALS — BP 130/84 | HR 84 | Temp 98.6°F | Wt 192.8 lb

## 2014-03-19 DIAGNOSIS — M7020 Olecranon bursitis, unspecified elbow: Secondary | ICD-10-CM

## 2014-03-19 DIAGNOSIS — M7022 Olecranon bursitis, left elbow: Secondary | ICD-10-CM

## 2014-03-19 NOTE — Patient Instructions (Signed)
Olecranon bursitis.  You can ice it occasionally.  Give me an update in about 1 week.  It should slowly get better on its own.  If red and tender, then let us know at that point.  Take care.

## 2014-03-19 NOTE — Progress Notes (Signed)
Pre visit review using our clinic review tool, if applicable. No additional management support is needed unless otherwise documented below in the visit note.  Hit L elbow about 3 weeks ago.  Bruised at the time.  Not painful.  Swelling started later, at the olecranon.  Bruising resolved, some imrpovement in swelling.  No FCNAVD.  No local redness, heat.  Never drained.  Wrapped once prev, no other interventions.    Meds, vitals, and allergies reviewed.   ROS: See HPI.  Otherwise, noncontributory.  nad L elbow with normal ROM, strength, no pain on palpation, but nontender and nonerythematous olecranon bursitis noted. Distally nv intact.

## 2014-03-19 NOTE — Assessment & Plan Note (Signed)
Not tender, doesn't appear infected.  Should slowly resolve.  Wouldn't aspirate at this point given the potential to introduce infection.  He can ice it down and notify us if not getting better.  He agrees.

## 2014-03-31 ENCOUNTER — Encounter: Payer: Self-pay | Admitting: Family Medicine

## 2014-04-03 ENCOUNTER — Encounter: Payer: Self-pay | Admitting: Family Medicine

## 2014-04-03 ENCOUNTER — Ambulatory Visit (INDEPENDENT_AMBULATORY_CARE_PROVIDER_SITE_OTHER): Payer: Medicare HMO | Admitting: Family Medicine

## 2014-04-03 VITALS — BP 104/76 | HR 81 | Temp 97.9°F | Wt 190.5 lb

## 2014-04-03 DIAGNOSIS — M7022 Olecranon bursitis, left elbow: Secondary | ICD-10-CM

## 2014-04-03 DIAGNOSIS — M7020 Olecranon bursitis, unspecified elbow: Secondary | ICD-10-CM

## 2014-04-03 MED ORDER — CEPHALEXIN 500 MG PO CAPS: 500.0000 mg | ORAL_CAPSULE | Freq: Four times a day (QID) | ORAL | Status: DC

## 2014-04-03 NOTE — Assessment & Plan Note (Signed)
S/p aspiration.  Routine postprocedure instructions d/w pt- keep area clean and use pressure bandage for 72 hours, follow up if concerns/spreading erythema/pain.  Tolerated well.  Start keflex in the meantime, to prev infection.  He agrees with plan.  No complications.

## 2014-04-03 NOTE — Patient Instructions (Signed)
Start the antibiotics today and keep the compression dressing on for at least 72 hours.   Call us if fever, redness, pain, etc.  Take care.

## 2014-04-03 NOTE — Progress Notes (Signed)
Pre visit review using our clinic review tool, if applicable. No additional management support is needed unless otherwise documented below in the visit note.  L olecranon bursitis continues.  Not tender but it gets in the way, still puffy, not smaller than prev.  He wanted drainage.  No fevers, no dec in ROM.  No erythema.    Meds, vitals, and allergies reviewed.   ROS: See HPI.  Otherwise, noncontributory.  Indication: inflamed bursa  Location: L olecranon bursa  Informed consent obtained.  Pt aware of risks not limited to but including infection, bleeding, damage to near by organs, along with possible re-accumulation of fluid.  Specific precautions to prevent those complications d/w pt.   Prep: etoh/betadine  Anesthesia: ethyl chloride   Aspiration make with 18g needle. ~20cc serosang fluids removed easily.  No purulent material.    Tolerated well, no complications.   Routine postprocedure instructions d/w pt- keep area clean and use pressure bandage for 72 hours, follow up if concerns/spreading erythema/pain.

## 2014-05-16 ENCOUNTER — Encounter: Payer: Self-pay | Admitting: Cardiovascular Disease

## 2014-05-16 ENCOUNTER — Ambulatory Visit (INDEPENDENT_AMBULATORY_CARE_PROVIDER_SITE_OTHER): Payer: Medicare HMO | Admitting: Cardiovascular Disease

## 2014-05-16 VITALS — BP 130/80 | HR 68 | Ht 66.0 in | Wt 189.5 lb

## 2014-05-16 DIAGNOSIS — E785 Hyperlipidemia, unspecified: Secondary | ICD-10-CM

## 2014-05-16 DIAGNOSIS — R202 Paresthesia of skin: Secondary | ICD-10-CM

## 2014-05-16 DIAGNOSIS — I10 Essential (primary) hypertension: Secondary | ICD-10-CM

## 2014-05-16 DIAGNOSIS — E1165 Type 2 diabetes mellitus with hyperglycemia: Secondary | ICD-10-CM

## 2014-05-16 DIAGNOSIS — R200 Anesthesia of skin: Secondary | ICD-10-CM

## 2014-05-16 DIAGNOSIS — IMO0001 Reserved for inherently not codable concepts without codable children: Secondary | ICD-10-CM

## 2014-05-16 DIAGNOSIS — R209 Unspecified disturbances of skin sensation: Secondary | ICD-10-CM

## 2014-05-16 NOTE — Progress Notes (Signed)
Patient ID: Theodore Estes, male    DOB: [birth year], 68 y.o.   MRN: [id number]  HPI Comments: Theodore Estes is a pleasant 68 year old gentleman with history of hyperlipidemia, diabetes, hypertension who presents for routine followup  He reports that overall he has been feeling well.  No symptoms of chest pain or shortness of breath.    stress test x2 in the past, one within the past several years.  He is relatively active, is able to walk fast.  Trying to lose weight to help his diabetes.  Hemoglobin A1c is down from 7.8 to 7.2 Total cholesterol 135, LDL 79  EKG shows normal sinus rhythm with rate 68 beats per minute, no significant ST or T wave changes    PMH . Hypertension  . Hyperlipidemia  . Diabetes mellitus without complication  . History of colonic polyps  . Erectile dysfunction    Past Surgical History . Colonoscopy  08/18/2003 . Vasectomy   . Cyst excised from right wrist     Family History . Cancer Mother    colon . Heart disease Father  . Diabetes Brother    Outpatient Encounter Prescriptions as of 05/16/2014  Medication Sig  . aspirin 81 MG tablet Take 81 mg by mouth daily.  . enalapril (VASOTEC) 20 MG tablet Take 1 tablet (20 mg total) by mouth daily.  Marland Kitchen glucose blood (ONE TOUCH ULTRA TEST) test strip Use to check blood sugar three times a day.  Dx: 250.02  . lovastatin (MEVACOR) 20 MG tablet Take 1 tablet (20 mg total) by mouth daily.  . metFORMIN (GLUCOPHAGE) 1000 MG tablet Take 1 tablet (1,000 mg total) by mouth 2 (two) times daily with a meal.  . NONFORMULARY OR COMPOUNDED ITEM Testosterone 5% cream.  Apply 34mL per day. 29ml 1rf  . pioglitazone (ACTOS) 15 MG tablet Take 1 tablet (15 mg total) by mouth daily.    Review of Systems  Constitutional: Negative.   HENT: Negative.   Eyes: Negative.   Respiratory: Negative.   Cardiovascular: Negative.   Gastrointestinal: Negative.   Endocrine: Negative.   Musculoskeletal: Negative.   Skin: Negative.    Allergic/Immunologic: Negative.   Neurological: Negative.   Hematological: Negative.   Psychiatric/Behavioral: Negative.   All other systems reviewed and are negative.   BP 140/90  Pulse 68  Ht 5\' 6"  (1.676 m)  Wt 189 lb 8 oz (85.957 kg)  BMI 30.60 kg/m2 Recheck of his blood pressure 130/80 Physical Exam  Nursing note and vitals reviewed. Constitutional: He is oriented to person, place, and time. He appears well-developed and well-nourished.  HENT:  Head: Normocephalic.  Nose: Nose normal.  Mouth/Throat: Oropharynx is clear and moist.  Eyes: Conjunctivae are normal. Pupils are equal, round, and reactive to light.  Neck: Normal range of motion. Neck supple. No JVD present.  Cardiovascular: Normal rate, regular rhythm, S1 normal, S2 normal, normal heart sounds and intact distal pulses.  Exam reveals no gallop and no friction rub.   No murmur heard. Pulmonary/Chest: Effort normal and breath sounds normal. No respiratory distress. He has no wheezes. He has no rales. He exhibits no tenderness.  Abdominal: Soft. Bowel sounds are normal. He exhibits no distension. There is no tenderness.  Musculoskeletal: Normal range of motion. He exhibits no edema and no tenderness.  Lymphadenopathy:    He has no cervical adenopathy.  Neurological: He is alert and oriented to person, place, and time. Coordination normal.  Skin: Skin is warm and dry. No rash noted. No  erythema.  Psychiatric: He has a normal mood and affect. His behavior is normal. Judgment and thought content normal.      Assessment and Plan

## 2014-05-16 NOTE — Patient Instructions (Signed)
You are doing well. No medication changes were made.  Please call us if you have new issues that need to be addressed before your next appt.  Your physician wants you to follow-up in: 12 months.  You will receive a reminder letter in the mail two months in advance. If you don't receive a letter, please call our office to schedule the follow-up appointment. 

## 2014-05-16 NOTE — Assessment & Plan Note (Signed)
Cholesterol is at goal on the current lipid regimen. No changes to the medications were made.  

## 2014-05-16 NOTE — Assessment & Plan Note (Signed)
Blood pressure is well controlled on today's visit. No changes made to the medications. 

## 2014-05-16 NOTE — Assessment & Plan Note (Signed)
We have encouraged continued exercise, careful diet management in an effort to lose weight. 

## 2014-06-17 ENCOUNTER — Other Ambulatory Visit: Payer: Self-pay | Admitting: Family Medicine

## 2014-06-17 MED ORDER — PIOGLITAZONE HCL 15 MG PO TABS: 15.0000 mg | ORAL_TABLET | Freq: Every day | ORAL | Status: DC

## 2014-06-25 ENCOUNTER — Other Ambulatory Visit: Payer: Self-pay | Admitting: Family Medicine

## 2014-06-25 DIAGNOSIS — IMO0001 Reserved for inherently not codable concepts without codable children: Secondary | ICD-10-CM

## 2014-06-25 DIAGNOSIS — E1165 Type 2 diabetes mellitus with hyperglycemia: Principal | ICD-10-CM

## 2014-06-30 ENCOUNTER — Other Ambulatory Visit (INDEPENDENT_AMBULATORY_CARE_PROVIDER_SITE_OTHER): Payer: Commercial Managed Care - HMO

## 2014-06-30 DIAGNOSIS — IMO0002 Reserved for concepts with insufficient information to code with codable children: Secondary | ICD-10-CM

## 2014-06-30 DIAGNOSIS — E1165 Type 2 diabetes mellitus with hyperglycemia: Secondary | ICD-10-CM

## 2014-06-30 LAB — HEMOGLOBIN A1C: HEMOGLOBIN A1C: 7.3 % — AB (ref 4.6–6.5)

## 2014-07-04 ENCOUNTER — Encounter: Payer: Self-pay | Admitting: Family Medicine

## 2014-07-04 ENCOUNTER — Ambulatory Visit (INDEPENDENT_AMBULATORY_CARE_PROVIDER_SITE_OTHER): Payer: Commercial Managed Care - HMO | Admitting: Family Medicine

## 2014-07-04 VITALS — BP 136/88 | HR 87 | Temp 97.3°F | Wt 191.2 lb

## 2014-07-04 DIAGNOSIS — E1165 Type 2 diabetes mellitus with hyperglycemia: Secondary | ICD-10-CM

## 2014-07-04 DIAGNOSIS — IMO0002 Reserved for concepts with insufficient information to code with codable children: Secondary | ICD-10-CM

## 2014-07-04 DIAGNOSIS — Z23 Encounter for immunization: Secondary | ICD-10-CM

## 2014-07-04 NOTE — Progress Notes (Signed)
Pre visit review using our clinic review tool, if applicable. No additional management support is needed unless otherwise documented below in the visit note.  Diabetes:  Using medications without difficulties:yes Hypoglycemic episodes:no Hyperglycemic episodes:no Feet problems:no Blood Sugars averaging: ~125 usually eye exam within last year: yes Flu shot today.   Diet and exercise d/w pt.  Complicated by work schedule.  Encouraged.   His elbow is better, resolved.   Meds, vitals, and allergies reviewed.   ROS: See HPI.  Otherwise negative.    GEN: nad, alert and oriented HEENT: mucous membranes moist NECK: supple w/o LA CV: rrr. PULM: ctab, no inc wob ABD: soft, +bs EXT: no edema SKIN: no acute rash  Diabetic foot exam: Normal inspection No skin breakdown No calluses  Normal DP pulses Normal sensation to light touch and monofilament Nails normal

## 2014-07-04 NOTE — Assessment & Plan Note (Signed)
Labs d/w pt. Minimal elevation in A1c.  Taking metformin qd, continue as is  If he can get his weight down, we can likely stop the actos.  D/w pt.  He agrees.  Recheck in about 6 months.  Flu shot today.  Normal foot exam.

## 2014-07-04 NOTE — Patient Instructions (Addendum)
Schedule a physical after April 3rd 2016.   Glad to see you.  Keep working on your weight in the meantime.

## 2014-07-04 NOTE — Addendum Note (Signed)
Addended by: Modena Nunnery on: 07/04/2014 09:50 AM   Modules accepted: Orders

## 2014-08-01 ENCOUNTER — Other Ambulatory Visit: Payer: Self-pay

## 2014-08-01 NOTE — Telephone Encounter (Signed)
Pt left v/m requesting status of testosterone refill; spoke with Caryl Pina at [facility] and she will refax request for testosterone that was prescribed by Dr Damita Dunnings. Pt request cb when med refilled. Do not see on med list.

## 2014-08-04 MED ORDER — NONFORMULARY OR COMPOUNDED ITEM: Status: DC

## 2014-08-04 NOTE — Telephone Encounter (Signed)
I don't see a form that has come through. I printed the rx.  Have patient pick up if it can't be faxed. Thanks.

## 2014-08-04 NOTE — Telephone Encounter (Signed)
Rx faxed to [facility] as instructed. Patient notified by telephone.

## 2014-09-12 ENCOUNTER — Telehealth: Payer: Self-pay | Admitting: Family Medicine

## 2014-09-12 ENCOUNTER — Other Ambulatory Visit: Payer: Self-pay | Admitting: *Deleted

## 2014-09-12 MED ORDER — ACCU-CHEK AVIVA PLUS W/DEVICE KIT: PACK | Status: DC

## 2014-09-12 MED ORDER — PIOGLITAZONE HCL 15 MG PO TABS: 15.0000 mg | ORAL_TABLET | Freq: Every day | ORAL | Status: DC

## 2014-09-12 MED ORDER — GLUCOSE BLOOD VI STRP
ORAL_STRIP | Status: DC
Start: 2014-09-12 — End: 2015-12-04

## 2014-09-12 MED ORDER — METFORMIN HCL 1000 MG PO TABS: 1000.0000 mg | ORAL_TABLET | Freq: Every day | ORAL | Status: DC

## 2014-09-12 MED ORDER — ENALAPRIL MALEATE 20 MG PO TABS: 20.0000 mg | ORAL_TABLET | Freq: Every day | ORAL | Status: DC

## 2014-09-12 MED ORDER — LOVASTATIN 20 MG PO TABS: 20.0000 mg | ORAL_TABLET | Freq: Every day | ORAL | Status: DC

## 2014-09-12 NOTE — Telephone Encounter (Signed)
Meter, strips and refills sent electronically to [facility].

## 2014-09-12 NOTE — Telephone Encounter (Signed)
Peggy called  Stating that beginning in January there insurance company will not longer accept one touch.  The insurance company Personnel officer) wants them to get accu check aviva plus meter  and test strips for this meter  He check twice daily  [facility] [facility]  Pt started taking metformin again at night. Because his blood sugar went back up.  Pt needs refill on this.  also needs refill on lovatatin enalapil actos

## 2015-01-09 ENCOUNTER — Other Ambulatory Visit: Payer: Self-pay | Admitting: Family Medicine

## 2015-01-23 ENCOUNTER — Other Ambulatory Visit: Payer: Self-pay | Admitting: *Deleted

## 2015-01-23 NOTE — Telephone Encounter (Signed)
Faxed refill request. Last Filled:    30 each 5 RF on 08/04/2014  Please advise.

## 2015-01-25 MED ORDER — NONFORMULARY OR COMPOUNDED ITEM: Status: DC

## 2015-01-25 NOTE — Telephone Encounter (Signed)
Printed, due for CPE.  Thanks.

## 2015-01-26 NOTE — Telephone Encounter (Signed)
Patient advised.  Rx left at front desk for pick up. 

## 2015-01-27 ENCOUNTER — Other Ambulatory Visit: Payer: Self-pay | Admitting: *Deleted

## 2015-02-11 ENCOUNTER — Ambulatory Visit (INDEPENDENT_AMBULATORY_CARE_PROVIDER_SITE_OTHER): Payer: Commercial Managed Care - HMO | Admitting: Family Medicine

## 2015-02-11 ENCOUNTER — Encounter: Payer: Self-pay | Admitting: Family Medicine

## 2015-02-11 VITALS — BP 140/84 | HR 78 | Temp 98.6°F | Ht 66.0 in | Wt 193.5 lb

## 2015-02-11 DIAGNOSIS — N529 Male erectile dysfunction, unspecified: Secondary | ICD-10-CM

## 2015-02-11 DIAGNOSIS — E785 Hyperlipidemia, unspecified: Secondary | ICD-10-CM | POA: Diagnosis not present

## 2015-02-11 DIAGNOSIS — Z0000 Encounter for general adult medical examination without abnormal findings: Secondary | ICD-10-CM | POA: Diagnosis not present

## 2015-02-11 DIAGNOSIS — E119 Type 2 diabetes mellitus without complications: Secondary | ICD-10-CM

## 2015-02-11 DIAGNOSIS — IMO0002 Reserved for concepts with insufficient information to code with codable children: Secondary | ICD-10-CM

## 2015-02-11 DIAGNOSIS — Z7189 Other specified counseling: Secondary | ICD-10-CM

## 2015-02-11 DIAGNOSIS — I10 Essential (primary) hypertension: Secondary | ICD-10-CM

## 2015-02-11 DIAGNOSIS — Z125 Encounter for screening for malignant neoplasm of prostate: Secondary | ICD-10-CM | POA: Diagnosis not present

## 2015-02-11 DIAGNOSIS — E291 Testicular hypofunction: Secondary | ICD-10-CM | POA: Diagnosis not present

## 2015-02-11 DIAGNOSIS — Z23 Encounter for immunization: Secondary | ICD-10-CM

## 2015-02-11 DIAGNOSIS — M25561 Pain in right knee: Secondary | ICD-10-CM

## 2015-02-11 DIAGNOSIS — E1165 Type 2 diabetes mellitus with hyperglycemia: Secondary | ICD-10-CM

## 2015-02-11 MED ORDER — NONFORMULARY OR COMPOUNDED ITEM: Status: DC

## 2015-02-11 MED ORDER — LOVASTATIN 20 MG PO TABS: 20.0000 mg | ORAL_TABLET | Freq: Every day | ORAL | Status: DC

## 2015-02-11 MED ORDER — METFORMIN HCL 1000 MG PO TABS: ORAL_TABLET | ORAL | Status: DC

## 2015-02-11 MED ORDER — ENALAPRIL MALEATE 20 MG PO TABS: 20.0000 mg | ORAL_TABLET | Freq: Every day | ORAL | Status: DC

## 2015-02-11 MED ORDER — PIOGLITAZONE HCL 15 MG PO TABS: 15.0000 mg | ORAL_TABLET | Freq: Every day | ORAL | Status: DC

## 2015-02-11 NOTE — Progress Notes (Signed)
Pre visit review using our clinic review tool, if applicable. No additional management support is needed unless otherwise documented below in the visit note.  CPE- See plan.  Routine anticipatory guidance given to patient.  See health maintenance. Tetanus 2013 Flu d/w pt PNA 2013 Shingles 2009 Living will d/w pt. Wife designated if patient were incapacitated.   PSA pending.   Colonoscopy 2015 Diet and exercise d/w pt. Encouraged both.    H/o low T.  Due for labs.  Helped with ED.  No ADE on med.  D/w pt about risk and benefit of med use.    Diabetes:  Using medications without difficulties:yes Hypoglycemic episodes:no Hyperglycemic episodes:no Feet problems:no Blood Sugars averaging: ~140 usually eye exam within last year: due, d/w pt.   Due for labs.   Hypertension:    Using medication without problems or lightheadedness: yes, except for mild lightheadedness and it isn't bothersome Chest pain with exertion:no Edema:no Short of breath:no  Elevated Cholesterol: Using medications without problems:yes Muscle aches: no Diet compliance: see above Exercise: see above Due for labs.   H/o R knee injection years ago for pain.  Done by ortho.  Helped a lot. Recently with some pain, but resolved today.    PMH and SH reviewed.   Vital signs, Meds and allergies reviewed.  ROS: See HPI.  Otherwise nontributory.   GEN: nad, alert and oriented HEENT: mucous membranes moist NECK: supple w/o LA CV: rrr.  no murmur PULM: ctab, no inc wob ABD: soft, +bs EXT: no edema SKIN: no acute rash  Diabetic foot exam: Normal inspection No skin breakdown No calluses  Normal DP pulses Normal sensation to light tough and monofilament Nails normal

## 2015-02-11 NOTE — Patient Instructions (Addendum)
Call about an eye exam.   Go to the lab on the way out.  We'll contact you with your lab report. Take care.  Glad to see you.   Keep working on Chief Operating Officer.  Recheck labs in about 6 months.

## 2015-02-12 DIAGNOSIS — Z7189 Other specified counseling: Secondary | ICD-10-CM | POA: Insufficient documentation

## 2015-02-12 DIAGNOSIS — M25561 Pain in right knee: Secondary | ICD-10-CM | POA: Insufficient documentation

## 2015-02-12 LAB — COMPREHENSIVE METABOLIC PANEL
ALT: 21 U/L (ref 0–53)
AST: 18 U/L (ref 0–37)
Albumin: 4.4 g/dL (ref 3.5–5.2)
Alkaline Phosphatase: 72 U/L (ref 39–117)
BUN: 18 mg/dL (ref 6–23)
CO2: 31 mEq/L (ref 19–32)
Calcium: 9.6 mg/dL (ref 8.4–10.5)
Chloride: 102 mEq/L (ref 96–112)
Creatinine, Ser: 1.22 mg/dL (ref 0.40–1.50)
GFR: 62.7 mL/min (ref >60.00)
Glucose, Bld: 118 mg/dL — ABNORMAL HIGH (ref 70–99)
Potassium: 4.6 mEq/L (ref 3.5–5.1)
Sodium: 139 mEq/L (ref 135–145)
Total Bilirubin: 0.5 mg/dL (ref 0.2–1.2)
Total Protein: 6.9 g/dL (ref 6.0–8.3)

## 2015-02-12 LAB — CBC WITH DIFFERENTIAL/PLATELET
Basophils Absolute: 0.1 10*3/uL (ref 0.0–0.1)
Basophils Relative: 1.5 % (ref 0.0–3.0)
Eosinophils Absolute: 0.2 10*3/uL (ref 0.0–0.7)
Eosinophils Relative: 2.3 % (ref 0.0–5.0)
HCT: 40 % (ref 39.0–52.0)
Hemoglobin: 14 g/dL (ref 13.0–17.0)
LYMPHS ABS: 2.2 10*3/uL (ref 0.7–4.0)
LYMPHS PCT: 30.7 % (ref 12.0–46.0)
MCHC: 34.9 g/dL (ref 30.0–36.0)
MCV: 90.2 fl (ref 78.0–100.0)
Monocytes Absolute: 0.6 10*3/uL (ref 0.1–1.0)
Monocytes Relative: 7.8 % (ref 3.0–12.0)
NEUTROS ABS: 4.2 10*3/uL (ref 1.4–7.7)
Neutrophils Relative %: 57.7 % (ref 43.0–77.0)
Platelets: 226 10*3/uL (ref 150.0–400.0)
RBC: 4.44 Mil/uL (ref 4.22–5.81)
RDW: 14 % (ref 11.5–15.5)
WBC: 7.3 10*3/uL (ref 4.0–10.5)

## 2015-02-12 LAB — HEMOGLOBIN A1C: HEMOGLOBIN A1C: 7.3 % — AB (ref 4.6–6.5)

## 2015-02-12 LAB — LIPID PANEL
Cholesterol: 143 mg/dL (ref 0–200)
HDL: 54.3 mg/dL (ref >39.00)
LDL Cholesterol: 69 mg/dL (ref 0–99)
NonHDL: 88.7
Total CHOL/HDL Ratio: 3
Triglycerides: 99 mg/dL (ref 0.0–149.0)
VLDL: 19.8 mg/dL (ref 0.0–40.0)

## 2015-02-12 LAB — TESTOSTERONE: Testosterone: 155.53 ng/dL — ABNORMAL LOW (ref 300.00–890.00)

## 2015-02-12 LAB — PSA, MEDICARE: PSA: 1.99 ng/ml (ref 0.10–4.00)

## 2015-02-12 NOTE — Assessment & Plan Note (Signed)
Routine anticipatory guidance given to patient.  See health maintenance. Tetanus 2013 Flu d/w pt PNA 2013 Shingles 2009 Living will d/w pt. Wife designated if patient were incapacitated.   PSA pending.   Colonoscopy 2015 Diet and exercise d/w pt. Encouraged both.

## 2015-02-12 NOTE — Assessment & Plan Note (Signed)
H/o R knee injection years ago for pain, was done by ortho. Helped a lot. Recently with some pain, but resolved today.  We agreed not to address since he was feeling fine w/o mechanical sx.  He'll update me as needed.

## 2015-02-12 NOTE — Assessment & Plan Note (Signed)
A1c pending. No change in meds yet, will see labs.  D/w pt about diet and exercise.

## 2015-02-12 NOTE — Assessment & Plan Note (Signed)
No change in meds yet, will see labs.  D/w pt about diet and exercise.  He agrees. If he has more lightheadedness, then we may have to taper his meds.  He agrees.  He'll update me as needed.

## 2015-02-12 NOTE — Assessment & Plan Note (Signed)
No change in meds yet, will see labs.  D/w pt about diet and exercise.  He agrees.

## 2015-02-12 NOTE — Assessment & Plan Note (Signed)
Due for labs. Helped with ED. No ADE on med. D/w pt about risk and benefit of med use.  Routine labs pending.

## 2015-02-18 ENCOUNTER — Other Ambulatory Visit: Payer: Self-pay | Admitting: Family Medicine

## 2015-02-18 DIAGNOSIS — R21 Rash and other nonspecific skin eruption: Secondary | ICD-10-CM

## 2015-04-14 ENCOUNTER — Other Ambulatory Visit: Payer: Self-pay | Admitting: *Deleted

## 2015-04-14 NOTE — Telephone Encounter (Signed)
Electronic refill request. Last Filled:   30 each 5 RF on 02/11/2015    Please advise.

## 2015-04-15 MED ORDER — NONFORMULARY OR COMPOUNDED ITEM: Status: DC

## 2015-04-15 NOTE — Telephone Encounter (Signed)
Patient notified by telephone that script is ready for pickup. Patient stated that he would like this called to the pharmacy. After talking with Dr. Damita Dunnings it was verified that this script can be called to the pharmacy.  Rx called to the pharmacy and patient notified by telephone.

## 2015-04-15 NOTE — Telephone Encounter (Signed)
Printed.  Thanks.  

## 2015-05-15 ENCOUNTER — Encounter: Payer: Self-pay | Admitting: *Deleted

## 2015-05-15 LAB — HM DIABETES EYE EXAM

## 2015-05-18 ENCOUNTER — Encounter: Payer: Self-pay | Admitting: Cardiovascular Disease

## 2015-05-18 ENCOUNTER — Ambulatory Visit (INDEPENDENT_AMBULATORY_CARE_PROVIDER_SITE_OTHER): Payer: Commercial Managed Care - HMO | Admitting: Cardiovascular Disease

## 2015-05-18 VITALS — BP 122/72 | HR 79 | Ht 66.0 in | Wt 193.2 lb

## 2015-05-18 DIAGNOSIS — I10 Essential (primary) hypertension: Secondary | ICD-10-CM | POA: Diagnosis not present

## 2015-05-18 DIAGNOSIS — E1165 Type 2 diabetes mellitus with hyperglycemia: Secondary | ICD-10-CM | POA: Diagnosis not present

## 2015-05-18 DIAGNOSIS — IMO0002 Reserved for concepts with insufficient information to code with codable children: Secondary | ICD-10-CM

## 2015-05-18 DIAGNOSIS — E785 Hyperlipidemia, unspecified: Secondary | ICD-10-CM | POA: Diagnosis not present

## 2015-05-18 NOTE — Patient Instructions (Signed)
You are doing well. No medication changes were made.  Research CT coronary calcium score on google  Please call us if you have new issues that need to be addressed before your next appt.  Your physician wants you to follow-up in: 12 months.  You will receive a reminder letter in the mail two months in advance. If you don't receive a letter, please call our office to schedule the follow-up appointment.

## 2015-05-18 NOTE — Progress Notes (Signed)
Patient ID: Theodore Estes, male    DOB: [birth year], 69 y.o.   MRN: [id number]  HPI Comments: [name] is a pleasant 69 year old gentleman with history of hyperlipidemia, diabetes, hypertension who presents for routine followup of his hyperlipidemia.  In general he reports that he is been doing well. No regular exercise program. Reports having poor sleep. Able to get to sleep but cannot stay asleep. Denies any symptoms concerning for angina. No shortness of breath with exertion  EKG on today's visit shows normal sinus rhythm with rate 79 bpm, no significant ST or T-wave changes  Other past medical history  stress test x2 in the past, one within the past several years.  He is relatively active, is able to walk fast.  Trying to lose weight to help his diabetes.  Hemoglobin A1c is down from 7.8 to 7.2 Prior lab work Total cholesterol 135, LDL 79     No Known Allergies  Current Outpatient Prescriptions on File Prior to Visit  Medication Sig Dispense Refill  . aspirin 81 MG tablet Take 81 mg by mouth daily.    . Blood Glucose Monitoring Suppl (ACCU-CHEK AVIVA PLUS) W/DEVICE KIT Use to check blood sugar twice daily or as needed.  Diagnosis:  E11.65  Non-insulin dependent. 1 kit 0  . enalapril (VASOTEC) 20 MG tablet Take 1 tablet (20 mg total) by mouth daily. 90 tablet 3  . glucose blood (ACCU-CHEK AVIVA PLUS) test strip Use as instructed to check blood sugar twice daily or as needed.  Diagnosis:  E11.65   Non insulin dependent. 100 each 5  . lovastatin (MEVACOR) 20 MG tablet Take 1 tablet (20 mg total) by mouth daily. 90 tablet 3  . metFORMIN (GLUCOPHAGE) 1000 MG tablet TAKE 1 TABLET BY MOUTH ONCE A DAY WITH BREAKFAST 90 tablet 3  . NONFORMULARY OR COMPOUNDED ITEM Testosterone 5% cream.  Apply 60m per day. 334m30 each 5  . pioglitazone (ACTOS) 15 MG tablet Take 1 tablet (15 mg total) by mouth daily. 90 tablet 3   No current facility-administered medications on file prior to visit.     Past Medical History  Diagnosis Date  . Hypertension   . Hyperlipidemia   . Diabetes mellitus without complication   . History of colonic polyps   . Erectile dysfunction   . Dyspnea   . Hypotestosteronism     Past Surgical History  Procedure Laterality Date  . Colonoscopy  08/18/2003  . Vasectomy  1977  . Cyst excised from right wrist  2000    Social History  reports that he has never smoked. He has never used smokeless tobacco. He reports that he does not drink alcohol or use illicit drugs.  Family History family history includes Cancer in his mother; Colon cancer (age of onset: 804in his mother; Diabetes in his brother; Heart disease in his father; Hyperlipidemia in his sister; Hypertension in his brother. There is no history of Prostate cancer.   Review of Systems  Constitutional: Negative.   HENT: Negative.   Eyes: Negative.   Respiratory: Negative.   Cardiovascular: Negative.   Gastrointestinal: Negative.   Endocrine: Negative.   Musculoskeletal: Negative.   Skin: Negative.   Allergic/Immunologic: Negative.   Neurological: Negative.   Hematological: Negative.   Psychiatric/Behavioral: Negative.   All other systems reviewed and are negative.   BP 122/72 mmHg  Pulse 79  Ht 5' 6"  (1.676 m)  Wt 193 lb 4 oz (87.658 kg)  BMI 31.21 kg/m2  Physical  Exam  Constitutional: He is oriented to person, place, and time. He appears well-developed and well-nourished.  HENT:  Head: Normocephalic.  Nose: Nose normal.  Mouth/Throat: Oropharynx is clear and moist.  Eyes: Conjunctivae are normal. Pupils are equal, round, and reactive to light.  Neck: Normal range of motion. Neck supple. No JVD present.  Cardiovascular: Normal rate, regular rhythm, S1 normal, S2 normal, normal heart sounds and intact distal pulses.  Exam reveals no gallop and no friction rub.   No murmur heard. Pulmonary/Chest: Effort normal and breath sounds normal. No respiratory distress. He has no  wheezes. He has no rales. He exhibits no tenderness.  Abdominal: Soft. Bowel sounds are normal. He exhibits no distension. There is no tenderness.  Musculoskeletal: Normal range of motion. He exhibits no edema or tenderness.  Lymphadenopathy:    He has no cervical adenopathy.  Neurological: He is alert and oriented to person, place, and time. Coordination normal.  Skin: Skin is warm and dry. No rash noted. No erythema.  Psychiatric: He has a normal mood and affect. His behavior is normal. Judgment and thought content normal.      Assessment and Plan   Nursing note and vitals reviewed.

## 2015-05-18 NOTE — Assessment & Plan Note (Signed)
Cholesterol is at goal on the current lipid regimen. No changes to the medications were made.  

## 2015-05-18 NOTE — Assessment & Plan Note (Signed)
We have encouraged continued exercise, careful diet management in an effort to lose weight. 

## 2015-05-18 NOTE — Assessment & Plan Note (Signed)
Blood pressure is well controlled on today's visit. No changes made to the medications. 

## 2015-05-19 ENCOUNTER — Encounter: Payer: Self-pay | Admitting: Family Medicine

## 2015-06-08 ENCOUNTER — Encounter: Payer: Self-pay | Admitting: Cardiovascular Disease

## 2015-08-05 ENCOUNTER — Encounter: Payer: Self-pay | Admitting: Gastroenterology

## 2015-08-14 ENCOUNTER — Other Ambulatory Visit (INDEPENDENT_AMBULATORY_CARE_PROVIDER_SITE_OTHER): Payer: Commercial Managed Care - HMO

## 2015-08-14 ENCOUNTER — Other Ambulatory Visit: Payer: Self-pay | Admitting: Family Medicine

## 2015-08-14 DIAGNOSIS — IMO0001 Reserved for inherently not codable concepts without codable children: Secondary | ICD-10-CM

## 2015-08-14 DIAGNOSIS — E1165 Type 2 diabetes mellitus with hyperglycemia: Secondary | ICD-10-CM

## 2015-08-14 LAB — HEMOGLOBIN A1C: HEMOGLOBIN A1C: 7.6 % — AB (ref 4.6–6.5)

## 2015-08-19 ENCOUNTER — Ambulatory Visit (INDEPENDENT_AMBULATORY_CARE_PROVIDER_SITE_OTHER): Payer: Commercial Managed Care - HMO | Admitting: Family Medicine

## 2015-08-19 ENCOUNTER — Encounter: Payer: Self-pay | Admitting: Family Medicine

## 2015-08-19 VITALS — BP 128/80 | HR 76 | Temp 98.2°F | Wt 193.5 lb

## 2015-08-19 DIAGNOSIS — Z119 Encounter for screening for infectious and parasitic diseases, unspecified: Secondary | ICD-10-CM | POA: Diagnosis not present

## 2015-08-19 DIAGNOSIS — E119 Type 2 diabetes mellitus without complications: Secondary | ICD-10-CM

## 2015-08-19 DIAGNOSIS — Z79899 Other long term (current) drug therapy: Secondary | ICD-10-CM

## 2015-08-19 DIAGNOSIS — E1165 Type 2 diabetes mellitus with hyperglycemia: Secondary | ICD-10-CM

## 2015-08-19 DIAGNOSIS — Z125 Encounter for screening for malignant neoplasm of prostate: Secondary | ICD-10-CM

## 2015-08-19 DIAGNOSIS — IMO0001 Reserved for inherently not codable concepts without codable children: Secondary | ICD-10-CM

## 2015-08-19 DIAGNOSIS — Z23 Encounter for immunization: Secondary | ICD-10-CM

## 2015-08-19 NOTE — Assessment & Plan Note (Signed)
A1c up, needs work on diet and exercise, no change in meds at this point.  Unclear how much recent illness affected his sugars, though appears improved recently.  Recheck in about 6 months, sooner if needed.  See AVS re: checking sugar at night.  If insomnia continues, then he'll update me.

## 2015-08-19 NOTE — Progress Notes (Signed)
Pre visit review using our clinic review tool, if applicable. No additional management support is needed unless otherwise documented below in the visit note.  Diabetes:  Using medications without difficulties: yes Hypoglycemic episodes: no Hyperglycemic episodes: no Feet problems: no Blood Sugars averaging: had been up to ~170 when he had a prolonged cold (several weeks), improved recently down to ~150.    eye exam within last year:yes Diet and exercise d/w pt.  Encouraged more exercise.    Not sleeping well.  He'll go to sleep easily then wake at 2AM.  Not sleeping well thereafter.  Is waking at night to urinate.  No known low sugars at night but not checked prev.   Pt opts in for HCV screening.  D/w pt re: routine screening.    Meds, vitals, and allergies reviewed.   ROS: See HPI.  Otherwise negative.    GEN: nad, alert and oriented HEENT: mucous membranes moist NECK: supple w/o LA CV: rrr. PULM: ctab, no inc wob ABD: soft, +bs EXT: no edema

## 2015-08-19 NOTE — Patient Instructions (Addendum)
Check your sugar at night (to make sure you don't have a low sugar). But back on fluid before bedtime.  Try taking melatonin at night before bed.  We'll can work on your testosterone refill next week.  Recheck labs before a physical in about 6 months.  Take care. Glad to see you.

## 2015-08-24 ENCOUNTER — Telehealth: Payer: Self-pay | Admitting: Family Medicine

## 2015-08-24 NOTE — Telephone Encounter (Signed)
Patient will need refill on testosterone at [facility] in [place]. Please verify sig with pharmacy (the sig we have should be correct) and then call in 1 month supply with 5 rf.  Thanks.

## 2015-08-25 ENCOUNTER — Other Ambulatory Visit: Payer: Self-pay | Admitting: *Deleted

## 2015-08-25 MED ORDER — NONFORMULARY OR COMPOUNDED ITEM: Status: DC

## 2015-08-25 NOTE — Telephone Encounter (Signed)
Rx printed and faxed to [facility] in [place].

## 2015-12-04 ENCOUNTER — Other Ambulatory Visit: Payer: Self-pay

## 2015-12-04 ENCOUNTER — Other Ambulatory Visit: Payer: Self-pay | Admitting: *Deleted

## 2015-12-04 MED ORDER — GLUCOSE BLOOD VI STRP: ORAL_STRIP | Status: DC

## 2015-12-04 MED ORDER — ONETOUCH ULTRA 2 W/DEVICE KIT: PACK | Status: DC

## 2015-12-04 MED ORDER — ONETOUCH DELICA LANCETS 33G MISC: Status: AC

## 2015-12-04 NOTE — Telephone Encounter (Signed)
Nikki from [facility] said that pts ins no longer covers the accuchek diabetic supplies but does cover the one touch ultra diabetic supplies; sent as requested and per protocol.

## 2016-01-12 ENCOUNTER — Other Ambulatory Visit: Payer: Self-pay | Admitting: *Deleted

## 2016-01-12 MED ORDER — LOVASTATIN 20 MG PO TABS: 20.0000 mg | ORAL_TABLET | Freq: Every day | ORAL | Status: DC

## 2016-02-01 ENCOUNTER — Other Ambulatory Visit: Payer: Self-pay | Admitting: *Deleted

## 2016-02-01 MED ORDER — NONFORMULARY OR COMPOUNDED ITEM: Status: DC

## 2016-02-01 NOTE — Telephone Encounter (Signed)
Faxed refill request. Last Filled:    30 each 5 08/25/2015  Last office visit:   08/19/2015  Please advise.

## 2016-02-01 NOTE — Telephone Encounter (Signed)
Printed.  Needs f/u OV with labs ahead of time.

## 2016-02-02 NOTE — Telephone Encounter (Addendum)
Phoned preferred number.  Patient in a meeting.  Left message with receptionist Marye Round) that his Rx is ready at his [place] office.

## 2016-02-04 ENCOUNTER — Other Ambulatory Visit: Payer: Self-pay | Admitting: *Deleted

## 2016-02-11 ENCOUNTER — Other Ambulatory Visit: Payer: Self-pay | Admitting: *Deleted

## 2016-02-11 MED ORDER — PIOGLITAZONE HCL 15 MG PO TABS: 15.0000 mg | ORAL_TABLET | Freq: Every day | ORAL | Status: DC

## 2016-02-11 MED ORDER — METFORMIN HCL 1000 MG PO TABS: ORAL_TABLET | ORAL | Status: DC

## 2016-02-11 MED ORDER — ENALAPRIL MALEATE 20 MG PO TABS: 20.0000 mg | ORAL_TABLET | Freq: Every day | ORAL | Status: DC

## 2016-04-04 ENCOUNTER — Ambulatory Visit (INDEPENDENT_AMBULATORY_CARE_PROVIDER_SITE_OTHER): Payer: PPO

## 2016-04-04 VITALS — BP 122/76 | HR 76 | Temp 98.5°F | Ht 66.0 in | Wt 176.5 lb

## 2016-04-04 DIAGNOSIS — Z0000 Encounter for general adult medical examination without abnormal findings: Secondary | ICD-10-CM

## 2016-04-04 NOTE — Progress Notes (Signed)
Pre visit review using our clinic review tool, if applicable. No additional management support is needed unless otherwise documented below in the visit note. 

## 2016-04-04 NOTE — Progress Notes (Signed)
PCP notes  Health maintenance:  Hep C screening - will be completed with future labs A1C - will be completed with future labs Foot exam - will be completed at CPE  Abnormal screenings:  Hearing - failed Fall risk - hx of multiple falls without injury  Patient concerns: Insomnia and erectile dysfunction. Pt will discuss these concerns with PCP at CPE.  Nurse concerns: None  Next PCP appt: 04/11/16 @ 0815  I had prev put in orders for A1c, HCV, etc.  I reviewed health advisor's note, was available for consultation on the day of service listed in this note, and agree with documentation and plan. Elsie Stain, MD.

## 2016-04-04 NOTE — Progress Notes (Signed)
Subjective:   Theodore Estes is a 70 y.o. male who presents for Medicare Annual/Subsequent preventive examination.  Review of Systems:  N/A Cardiac Risk Factors include: advanced age (>71mn, >>69women);diabetes mellitus;dyslipidemia;male gender;hypertension     Objective:    Vitals: BP 122/76 mmHg  Pulse 76  Temp(Src) 98.5 F (36.9 C) (Oral)  Ht 5' 6"  (1.676 m)  Wt 176 lb 8 oz (80.06 kg)  BMI 28.50 kg/m2  SpO2 96%  Body mass index is 28.5 kg/(m^2).  Tobacco History  Smoking status  . Never Smoker   Smokeless tobacco  . Never Used     Counseling given: No   Past Medical History  Diagnosis Date  . Hypertension   . Hyperlipidemia   . Diabetes mellitus without complication [place]   . History of colonic polyps   . Erectile dysfunction   . Dyspnea   . Hypotestosteronism    Past Surgical History  Procedure Laterality Date  . Colonoscopy  08/18/2003  . Vasectomy  1977  . Cyst excised from right wrist  2000   Family History  Problem Relation Age of Onset  . Cancer Mother     colon  . Colon cancer Mother 838 . Heart disease Father   . Diabetes Brother   . Hypertension Brother   . Prostate cancer Neg Hx   . Hyperlipidemia Sister    History  Sexual Activity  . Sexual Activity:  . Partners: Female    Outpatient Encounter Prescriptions as of 04/04/2016  Medication Sig  . aspirin 81 MG tablet Take 81 mg by mouth daily.  . Blood Glucose Monitoring Suppl (ONE TOUCH ULTRA 2) w/Device KIT Check blood sugar twice a day and as directed. Dx. E11.65  . enalapril (VASOTEC) 20 MG tablet Take 1 tablet (20 mg total) by mouth daily.  .Marland Kitchenglucose blood (ACCU-CHEK AVIVA PLUS) test strip Use as instructed to test blood sugar twice daily or as needed.  Diagnosis:  E11.9   Non insulin-dependent.  .Marland Kitchenglucose blood (ONE TOUCH ULTRA TEST) test strip Check blood sugar twice a day and as directed. Dx. E11.65  . lovastatin (MEVACOR) 20 MG tablet Take 1 tablet (20 mg total) by mouth  daily.  . metFORMIN (GLUCOPHAGE) 1000 MG tablet TAKE 1 TABLET BY MOUTH ONCE A DAY WITH BREAKFAST  . NONFORMULARY OR COMPOUNDED ITEM Testosterone 5% cream.  Apply 170mper day. 3066m. ONETOUCH DELICA LANCETS [id number] Check blood sugar twice a day and as directed. Dx. E11.65  . pioglitazone (ACTOS) 15 MG tablet Take 1 tablet (15 mg total) by mouth daily.   No facility-administered encounter medications on file as of 04/04/2016.    Activities of Daily Living In your present state of health, do you have any difficulty performing the following activities: 04/04/2016  Hearing? Y  Vision? N  Difficulty concentrating or making decisions? N  Walking or climbing stairs? N  Dressing or bathing? N  Doing errands, shopping? N  Preparing Food and eating ? N  Using the Toilet? N  In the past six months, have you accidently leaked urine? N  Do you have problems with loss of bowel control? N  Managing your Medications? N  Managing your Finances? N  Housekeeping or managing your Housekeeping? N    Patient Care Team: GraTonia GhentD as PCP - General (Family Medicine) TimMinna MerrittsD as Consulting Physician (Cardiology)   Assessment:     Hearing Screening   125Hz  250Hz  500Hz  1000Hz   2000Hz  4000Hz  8000Hz   Right ear:   40 40 40 0   Left ear:   0 0 40 0   Vision Screening Comments: Last eye exam approx. 6 mths ago; pt unable to recall provider or exact date   Exercise Activities and Dietary recommendations Current Exercise Habits: The patient has a physically strenous job, but has no regular exercise apart from work., Exercise limited by: None identified  Goals    . sleep management     Starting 04/04/2016, I will attempt to do non-stimulating activities prior to bed to help with better sleep management.       Fall Risk Fall Risk  04/04/2016 02/11/2015 01/02/2014  Falls in the past year? Yes Yes No  Number falls in past yr: 2 or more 1 -  Injury with Fall? No Yes -  Risk for fall due to  : - Other (Comment) -  Follow up Falls evaluation completed - -   Depression Screen PHQ 2/9 Scores 04/04/2016 02/11/2015 01/02/2014  PHQ - 2 Score 0 0 0    Cognitive Testing MMSE - Mini Mental State Exam 04/04/2016  Orientation to time 5  Orientation to Place 5  Registration 3  Attention/ Calculation 0  Recall 3  Language- name 2 objects 0  Language- repeat 1  Language- follow 3 step command 3  Language- read & follow direction 0  Write a sentence 0  Copy design 0  Total score 20   PLEASE NOTE: A Mini-Cog screen was completed. Maximum score is 20. A value of 0 denotes this part of Folstein MMSE was not completed or the patient failed this part of the Mini-Cog screening.   Mini-Cog Screening Orientation to Time - Max 5 pts Orientation to Place - Max 5 pts Registration - Max 3 pts Recall - Max 3 pts Language Repeat - Max 1 pts Language Follow 3 Step Command - Max 3 pts  Immunization History  Administered Date(s) Administered  . Influenza Whole 06/26/2008, 06/26/2009, 10/19/2011  . Influenza,inj,Quad PF,36+ Mos 07/04/2014, 08/19/2015  . Influenza-Unspecified 07/27/2013  . Pneumococcal Conjugate-13 02/11/2015  . Pneumococcal Polysaccharide-23 01/27/2012  . Tdap 01/27/2012  . Zoster 06/26/2008   Screening Tests Health Maintenance  Topic Date Due  . HEMOGLOBIN A1C  04/05/2016 (Originally 02/11/2016)  . Hepatitis C Screening  04/05/2016 (Originally [birth year])  . FOOT EXAM  04/11/2016 (Originally 02/11/2016)  . INFLUENZA VACCINE  04/26/2016  . OPHTHALMOLOGY EXAM  05/14/2016  . TETANUS/TDAP  01/26/2022  . COLONOSCOPY  01/10/2024  . ZOSTAVAX  Completed  . PNA vac Low Risk Adult  Completed      Plan:     I have personally reviewed and addressed the Medicare Annual Wellness questionnaire and have noted the following in the patient's chart:  A. Medical and social history B. Use of alcohol, tobacco or illicit drugs  C. Current medications and supplements D. Functional  ability and status E.  Nutritional status F.  Physical activity G. Advance directives H. List of other physicians I.  Hospitalizations, surgeries, and ER visits in previous 12 months J.  [place] to include hearing, vision, cognitive, depression L. Referrals and appointments - none  In addition, I have reviewed and discussed with patient certain preventive protocols, quality metrics, and best practice recommendations. A written personalized care plan for preventive services as well as general preventive health recommendations were provided to patient.  See attached scanned questionnaire for additional information.   Signed,   Lindell Noe, MHA, BS, LPN Health  Advisor

## 2016-04-04 NOTE — Patient Instructions (Signed)
Theodore Estes , Thank you for taking time to come for your Medicare Wellness Visit. I appreciate your ongoing commitment to your health goals. Please review the following plan we discussed and let me know if I can assist you in the future.   These are the goals we discussed: Goals    . sleep management     Starting 04/04/2016, I will attempt to do non-stimulating activities prior to bed to help with better sleep management.        This is a list of the screening recommended for you and due dates:  Health Maintenance  Topic Date Due  . Hemoglobin A1C  04/05/2016*  .  Hepatitis C: One time screening is recommended by Center for Disease Control  (CDC) for  adults born from [birth year] through 1965.   04/05/2016*  . Complete foot exam   04/11/2016*  . Flu Shot  04/26/2016  . Eye exam for diabetics  05/14/2016  . Tetanus Vaccine  01/26/2022  . Colon Cancer Screening  01/10/2024  . Shingles Vaccine  Completed  . Pneumonia vaccines  Completed  *Topic was postponed. The date shown is not the original due date.    Preventive Care for Adults  A healthy lifestyle and preventive care can promote health and wellness. Preventive health guidelines for adults include the following key practices.  . A routine yearly physical is a good way to check with your health care provider about your health and preventive screening. It is a chance to share any concerns and updates on your health and to receive a thorough exam.  . Visit your dentist for a routine exam and preventive care every 6 months. Brush your teeth twice a day and floss once a day. Good oral hygiene prevents tooth decay and gum disease.  . The frequency of eye exams is based on your age, health, family medical history, use  of contact lenses, and other factors. Follow your health care provider's ecommendations for frequency of eye exams.  . Eat a healthy diet. Foods like vegetables, fruits, whole grains, low-fat dairy products, and lean protein  foods contain the nutrients you need without too many calories. Decrease your intake of foods high in solid fats, added sugars, and salt. Eat the right amount of calories for you. Get information about a proper diet from your health care provider, if necessary.  . Regular physical exercise is one of the most important things you can do for your health. Most adults should get at least 150 minutes of moderate-intensity exercise (any activity that increases your heart rate and causes you to sweat) each week. In addition, most adults need muscle-strengthening exercises on 2 or more days a week.  Silver Sneakers may be a benefit available to you. To determine eligibility, you may visit the website: www.silversneakers.com or contact program at (252) 436-5599 Mon-Fri between 8AM-8PM.   . Maintain a healthy weight. The body mass index (BMI) is a screening tool to identify possible weight problems. It provides an estimate of body fat based on height and weight. Your health care provider can find your BMI and can help you achieve or maintain a healthy weight.   For adults 20 years and older: ? A BMI below 18.5 is considered underweight. ? A BMI of 18.5 to 24.9 is normal. ? A BMI of 25 to 29.9 is considered overweight. ? A BMI of 30 and above is considered obese.   . Maintain normal blood lipids and cholesterol levels by exercising and minimizing your  intake of saturated fat. Eat a balanced diet with plenty of fruit and vegetables. Blood tests for lipids and cholesterol should begin at age 16 and be repeated every 5 years. If your lipid or cholesterol levels are high, you are over 50, or you are at high risk for heart disease, you may need your cholesterol levels checked more frequently. Ongoing high lipid and cholesterol levels should be treated with medicines if diet and exercise are not working.  . If you smoke, find out from your health care provider how to quit. If you do not use tobacco, please do not  start.  . If you choose to drink alcohol, please do not consume more than 2 drinks per day. One drink is considered to be 12 ounces (355 mL) of beer, 5 ounces (148 mL) of wine, or 1.5 ounces (44 mL) of liquor.  . If you are 68-49 years old, ask your health care provider if you should take aspirin to prevent strokes.  . Use sunscreen. Apply sunscreen liberally and repeatedly throughout the day. You should seek shade when your shadow is shorter than you. Protect yourself by wearing long sleeves, pants, a wide-brimmed hat, and sunglasses year round, whenever you are outdoors.  . Once a month, do a whole body skin exam, using a mirror to look at the skin on your back. Tell your health care provider of new moles, moles that have irregular borders, moles that are larger than a pencil eraser, or moles that have changed in shape or color.

## 2016-04-05 ENCOUNTER — Other Ambulatory Visit: Payer: Self-pay | Admitting: Family Medicine

## 2016-04-05 ENCOUNTER — Other Ambulatory Visit (INDEPENDENT_AMBULATORY_CARE_PROVIDER_SITE_OTHER): Payer: PPO

## 2016-04-05 DIAGNOSIS — E119 Type 2 diabetes mellitus without complications: Secondary | ICD-10-CM | POA: Diagnosis not present

## 2016-04-05 DIAGNOSIS — Z119 Encounter for screening for infectious and parasitic diseases, unspecified: Secondary | ICD-10-CM

## 2016-04-05 DIAGNOSIS — Z125 Encounter for screening for malignant neoplasm of prostate: Secondary | ICD-10-CM

## 2016-04-05 DIAGNOSIS — Z79899 Other long term (current) drug therapy: Secondary | ICD-10-CM | POA: Diagnosis not present

## 2016-04-05 LAB — COMPREHENSIVE METABOLIC PANEL
ALK PHOS: 77 U/L (ref 39–117)
ALT: 16 U/L (ref 0–53)
AST: 18 U/L (ref 0–37)
Albumin: 4.4 g/dL (ref 3.5–5.2)
BILIRUBIN TOTAL: 1 mg/dL (ref 0.2–1.2)
BUN: 21 mg/dL (ref 6–23)
CALCIUM: 10 mg/dL (ref 8.4–10.5)
CO2: 31 mEq/L (ref 19–32)
CREATININE: 1.47 mg/dL (ref 0.40–1.50)
Chloride: 104 mEq/L (ref 96–112)
GFR: 50.39 mL/min — AB (ref >60.00)
Glucose, Bld: 133 mg/dL — ABNORMAL HIGH (ref 70–99)
Potassium: 5.3 mEq/L — ABNORMAL HIGH (ref 3.5–5.1)
Sodium: 141 mEq/L (ref 135–145)
TOTAL PROTEIN: 7 g/dL (ref 6.0–8.3)

## 2016-04-05 LAB — CBC WITH DIFFERENTIAL/PLATELET
BASOS ABS: 0 10*3/uL (ref 0.0–0.1)
Basophils Relative: 0.5 % (ref 0.0–3.0)
EOS ABS: 0.1 10*3/uL (ref 0.0–0.7)
Eosinophils Relative: 1.6 % (ref 0.0–5.0)
HCT: 44.8 % (ref 39.0–52.0)
HEMOGLOBIN: 15.1 g/dL (ref 13.0–17.0)
LYMPHS PCT: 30.1 % (ref 12.0–46.0)
Lymphs Abs: 1.6 10*3/uL (ref 0.7–4.0)
MCHC: 33.7 g/dL (ref 30.0–36.0)
MCV: 87.5 fl (ref 78.0–100.0)
MONOS PCT: 10.9 % (ref 3.0–12.0)
Monocytes Absolute: 0.6 10*3/uL (ref 0.1–1.0)
Neutro Abs: 3 10*3/uL (ref 1.4–7.7)
Neutrophils Relative %: 56.9 % (ref 43.0–77.0)
Platelets: 234 10*3/uL (ref 150.0–400.0)
RBC: 5.12 Mil/uL (ref 4.22–5.81)
RDW: 13.8 % (ref 11.5–15.5)
WBC: 5.3 10*3/uL (ref 4.0–10.5)

## 2016-04-05 LAB — LIPID PANEL
CHOL/HDL RATIO: 3
Cholesterol: 131 mg/dL (ref 0–200)
HDL: 47.3 mg/dL (ref >39.00)
LDL Cholesterol: 72 mg/dL (ref 0–99)
NONHDL: 83.45
TRIGLYCERIDES: 58 mg/dL (ref 0.0–149.0)
VLDL: 11.6 mg/dL (ref 0.0–40.0)

## 2016-04-05 LAB — PSA, MEDICARE: PSA: 3.56 ng/mL (ref 0.10–4.00)

## 2016-04-05 LAB — HEMOGLOBIN A1C: Hgb A1c MFr Bld: 7 % — ABNORMAL HIGH (ref 4.6–6.5)

## 2016-04-06 LAB — HEPATITIS C ANTIBODY: HCV AB: NEGATIVE

## 2016-04-11 ENCOUNTER — Ambulatory Visit (INDEPENDENT_AMBULATORY_CARE_PROVIDER_SITE_OTHER): Payer: PPO | Admitting: Family Medicine

## 2016-04-11 ENCOUNTER — Encounter: Payer: Self-pay | Admitting: Family Medicine

## 2016-04-11 VITALS — BP 108/66 | HR 72 | Temp 97.9°F | Ht 66.0 in | Wt 174.5 lb

## 2016-04-11 DIAGNOSIS — N529 Male erectile dysfunction, unspecified: Secondary | ICD-10-CM | POA: Diagnosis not present

## 2016-04-11 DIAGNOSIS — E119 Type 2 diabetes mellitus without complications: Secondary | ICD-10-CM

## 2016-04-11 DIAGNOSIS — Z79899 Other long term (current) drug therapy: Secondary | ICD-10-CM

## 2016-04-11 DIAGNOSIS — E785 Hyperlipidemia, unspecified: Secondary | ICD-10-CM

## 2016-04-11 DIAGNOSIS — I10 Essential (primary) hypertension: Secondary | ICD-10-CM

## 2016-04-11 DIAGNOSIS — E291 Testicular hypofunction: Secondary | ICD-10-CM

## 2016-04-11 MED ORDER — ENALAPRIL MALEATE 20 MG PO TABS: 10.0000 mg | ORAL_TABLET | Freq: Every day | ORAL | Status: DC

## 2016-04-11 MED ORDER — SILDENAFIL CITRATE 20 MG PO TABS: 60.0000 mg | ORAL_TABLET | Freq: Every day | ORAL | Status: DC | PRN

## 2016-04-11 MED ORDER — LOVASTATIN 20 MG PO TABS: 20.0000 mg | ORAL_TABLET | Freq: Every day | ORAL | Status: DC

## 2016-04-11 NOTE — Patient Instructions (Signed)
Cut back on the enalapril- take 1/2 tab a day.  If still lightheaded then let me know.   Recheck labs in about 6 months before a visit.  Keep working on your weight.  Thanks for your effort.  Take care.  Glad to see you.

## 2016-04-11 NOTE — Addendum Note (Signed)
Addended by: Josetta Huddle on: 04/11/2016 12:00 PM   Modules accepted: Orders, Medications, SmartSet

## 2016-04-11 NOTE — Progress Notes (Signed)
Pre visit review using our clinic review tool, if applicable. No additional management support is needed unless otherwise documented below in the visit note.  Fall risk d/w pt.  Routine cautions given.   Hearing screening d/w pt.  Declined hearing aids.    ED.  Had been troubling him for several years.  Some times are better than others.  On testosterone cream.  PSA inc but not elevation (still <4) d/w pt.  Didn't tolerate cialis, had a headache and didn't work well.  Viagra helped some, was able to tolerate.  He thought his T replacement was helping his fatigue and concentration.  When he ran out prev, he noted a change.  D/w pt about viagra trial and risks of T replacement in general, not limited to but including liver, renal, hematologic complications, CVA risk, PSA elevation, prostate enlargement and possible prostate cancer.  D/w pt about options at this point.   Diabetes:  Using medications without difficulties:yes Hypoglycemic episodes:no Hyperglycemic episodes:no Feet problems:no Blood Sugars averaging: usually <140 in the AMs eye exam within last year: yes Intentional weight loss noted, cut back on diet, d/w pt.  I thanked him for his effort.   A1c d/w pt.    Hypertension:    Using medication without problems or lightheadedness: occ lightheaded.  Weight loss noted with relatively lower BP noted.   Chest pain with exertion:no Edema:no Short of breath:no  Elevated Cholesterol: Using medications without problems: yes Muscle aches: no Diet compliance:yes Exercise:yes Labs d/w pt.    PMH and SH reviewed.   Vital signs, Meds and allergies reviewed.  ROS: Per HPI unless specifically indicated in ROS section   GEN: nad, alert and oriented HEENT: mucous membranes moist NECK: supple w/o LA CV: rrr.   PULM: ctab, no inc wob ABD: soft, +bs EXT: no edema SKIN: no acute rash, benign SKs noted.   Diabetic foot exam: Normal inspection No skin breakdown No calluses  Normal  DP pulses Normal sensation to light tough and monofilament Nails normal

## 2016-04-11 NOTE — Assessment & Plan Note (Signed)
He thought his T replacement was helping his fatigue and concentration.  When he ran out prev, he noted a change.  D/w pt about viagra trial and risks of T replacement in general, not limited to but including liver, renal, hematologic complications, CVA risk, PSA elevation, prostate enlargement and possible prostate cancer.  D/w pt about options at this point.  Mild PSA elevation noted, sill <4.  Minimal LUTS, with some stream slowing.  At this point, DRE isn't going to change mgmt.  With or without enlargement of prostate on exam, then we would still recheck PSA in about 6 months.  D/w pt about PSA testing in general, with risk of false positives noted.  He agrees.  Still okay for outpatient f/u.  CA risk d/w pt and he wants to continue with T replacement.  Orders placed.  He agrees.  >25 minutes spent in face to face time with patient, >50% spent in counselling or coordination of care.

## 2016-04-11 NOTE — Assessment & Plan Note (Signed)
Now occ lightheaded with weight loss noted. Would dec enalapril to 10mg  a day and he'll update me as needed.   Recheck Cr in about 6 months.

## 2016-04-11 NOTE — Assessment & Plan Note (Signed)
Controlled, continue as is.  Labs d/w pt.  Weight loss noted.

## 2016-04-11 NOTE — Assessment & Plan Note (Signed)
Can try adding on sildenafil with routine cautions.

## 2016-04-11 NOTE — Assessment & Plan Note (Signed)
A1c improved with weight loss and med use.  D/w pt.  Would continue as is.  I thanked him for his efforts. Labs d/w pt.

## 2016-05-17 DIAGNOSIS — H2513 Age-related nuclear cataract, bilateral: Secondary | ICD-10-CM | POA: Diagnosis not present

## 2016-05-17 DIAGNOSIS — H35371 Puckering of macula, right eye: Secondary | ICD-10-CM | POA: Diagnosis not present

## 2016-05-17 DIAGNOSIS — E119 Type 2 diabetes mellitus without complications: Secondary | ICD-10-CM | POA: Diagnosis not present

## 2016-05-17 LAB — HM DIABETES EYE EXAM

## 2016-05-20 ENCOUNTER — Ambulatory Visit (INDEPENDENT_AMBULATORY_CARE_PROVIDER_SITE_OTHER): Payer: PPO | Admitting: Cardiovascular Disease

## 2016-05-20 ENCOUNTER — Encounter: Payer: Self-pay | Admitting: Cardiovascular Disease

## 2016-05-20 VITALS — BP 118/70 | HR 76 | Ht 66.0 in | Wt 178.5 lb

## 2016-05-20 DIAGNOSIS — E785 Hyperlipidemia, unspecified: Secondary | ICD-10-CM | POA: Diagnosis not present

## 2016-05-20 DIAGNOSIS — G4700 Insomnia, unspecified: Secondary | ICD-10-CM

## 2016-05-20 DIAGNOSIS — E119 Type 2 diabetes mellitus without complications: Secondary | ICD-10-CM | POA: Diagnosis not present

## 2016-05-20 DIAGNOSIS — I10 Essential (primary) hypertension: Secondary | ICD-10-CM | POA: Diagnosis not present

## 2016-05-20 NOTE — Progress Notes (Signed)
Cardiology Office Note  Date:  05/20/2016   ID:  Theodore Estes, Theodore Estes [birth year], MRN [id number]  PCP:  Theodore Stain, MD   Chief Complaint  Patient presents with  . Other    1 yr f/u. Meds reviewed verbally with pt.    HPI:  Theodore Estes is a pleasant 70 year old gentleman with history of hyperlipidemia, diabetes, hypertension who presents for routine followup of his hyperlipidemia and cardiac risk factors  In general he reports that he is been doing well.  No regular exercise program.  Reports having poor sleep. Able to get to sleep but cannot stay asleep. Wakes up at 3 AM Denies any symptoms concerning for angina. No shortness of breath with exertion  Total chol 131, LDL 72 HbA1C 7.0  Weight down 20 pounds  EKG on today's visit shows normal sinus rhythm with rate 76 bpm, no significant ST or T-wave changes  Other past medical history  stress test x2 in the past, one within the past several years.  He is relatively active, is able to walk fast.  Trying to lose weight to help his diabetes.  Hemoglobin A1c is down from 7.8 to 7.2 Prior lab work Total cholesterol 135, LDL 79   PMH:   has a past medical history of Diabetes mellitus without complication (Theodore Estes); Dyspnea; Erectile dysfunction; History of colonic polyps; Hyperlipidemia; Hypertension; and Hypotestosteronism.  PSH:    Past Surgical History:  Procedure Laterality Date  . COLONOSCOPY  08/18/2003  . cyst excised from right wrist  2000  . VASECTOMY  1977    Current Outpatient Prescriptions  Medication Sig Dispense Refill  . aspirin 81 MG tablet Take 81 mg by mouth daily.    . Blood Glucose Monitoring Suppl (ONE TOUCH ULTRA 2) w/Device KIT Check blood sugar twice a day and as directed. Dx. E11.65 1 each 0  . enalapril (VASOTEC) 20 MG tablet Take 0.5 tablets (10 mg total) by mouth daily.    Marland Kitchen glucose blood (ONE TOUCH ULTRA TEST) test strip Check blood sugar twice a day and as directed. Dx. E11.65 100 each 5  .  lovastatin (MEVACOR) 20 MG tablet Take 1 tablet (20 mg total) by mouth daily. 90 tablet 3  . metFORMIN (GLUCOPHAGE) 1000 MG tablet TAKE 1 TABLET BY MOUTH ONCE A DAY WITH BREAKFAST 90 tablet 3  . NONFORMULARY OR COMPOUNDED ITEM Testosterone 5% cream.  Apply 32m per day. 370m30 each 1  . ONETOUCH DELICA LANCETS 3318EISC Check blood sugar twice a day and as directed. Dx. E11.65 100 each 5  . pioglitazone (ACTOS) 15 MG tablet Take 1 tablet (15 mg total) by mouth daily. 90 tablet 3  . sildenafil (REVATIO) 20 MG tablet Take 3-5 tablets (60-100 mg total) by mouth daily as needed. 50 tablet prn   No current facility-administered medications for this visit.      Allergies:   Review of patient's allergies indicates no known allergies.   Social History:  The patient  reports that he has never smoked. He has never used smokeless tobacco. He reports that he does not drink alcohol or use drugs.   Family History:   family history includes Cancer in his mother; Colon cancer (age of onset: 801in his mother; Diabetes in his brother; Heart disease in his father; Hyperlipidemia in his sister; Hypertension in his brother.    Review of Systems: Review of Systems  Constitutional: Negative.   Respiratory: Negative.   Cardiovascular: Negative.   Gastrointestinal: Negative.  Musculoskeletal: Negative.   Neurological: Negative.   Psychiatric/Behavioral: Negative.        Sleep disorder  All other systems reviewed and are negative.    PHYSICAL EXAM: VS:  BP 118/70 (BP Location: Left Arm, Patient Position: Sitting, Cuff Size: Normal)   Pulse 76   Ht 5' 6"  (1.676 m)   Wt 178 lb 8 oz (81 kg)   BMI 28.81 kg/m  , BMI Body mass index is 28.81 kg/m. GEN: Well nourished, well developed, in no acute distress  HEENT: normal  Neck: no JVD, carotid bruits, or masses Cardiac: RRR; no murmurs, rubs, or gallops,no edema  Respiratory:  clear to auscultation bilaterally, normal work of breathing GI: soft,  nontender, nondistended, + BS MS: no deformity or atrophy  Skin: warm and dry, no rash Neuro:  Strength and sensation are intact Psych: euthymic mood, full affect    Recent Labs: 04/05/2016: ALT 16; BUN 21; Creatinine, Ser 1.47; Hemoglobin 15.1; Platelets 234.0; Potassium 5.3; Sodium 141    Lipid Panel Lab Results  Component Value Date   CHOL 131 04/05/2016   HDL 47.30 04/05/2016   LDLCALC 72 04/05/2016   TRIG 58.0 04/05/2016      Wt Readings from Last 3 Encounters:  05/20/16 178 lb 8 oz (81 kg)  04/11/16 174 lb 8 oz (79.2 kg)  04/04/16 176 lb 8 oz (80.1 kg)       ASSESSMENT AND PLAN:  Essential hypertension - Plan: EKG 12-Lead Blood pressure is well controlled on today's visit. No changes made to the medications.  Insomnia Recommended he try Benadryl or melatonin  HLD (hyperlipidemia) [place] of aggressive lipid management discussed Cholesterol is at goal on the current lipid regimen. No changes to the medications were made.  Controlled type 2 diabetes mellitus without complication, without long-term current use of insulin (Theodore Estes) importance of aggressive diabetes control discussed with him in detail A1c has improved down to 7.0 We have encouraged continued exercise, careful diet management in an effort to lose weight.  Discussed various imaging options for risk stratification such as CT coronary calcium scoring  Total encounter time more than 25 minutes  Greater than 50% was spent in counseling and coordination of care with the patient   Disposition:   F/U  12 months   Orders Placed This Encounter  Procedures  . EKG 12-Lead     Signed, Esmond Plants, M.D., Ph.D. 05/20/2016  [place], [place]

## 2016-05-20 NOTE — Patient Instructions (Signed)
Medication Instructions:   No medication changes  Labwork:  No new labs  Testing/Procedures:  Research CT coronary calcium score    Follow-Up: It was a pleasure seeing you in the office today. Please call us if you have new issues that need to be addressed before your next appt.  [phone]  Your physician wants you to follow-up in: 12 months.  You will receive a reminder letter in the mail two months in advance. If you don't receive a letter, please call our office to schedule the follow-up appointment.  If you need a refill on your cardiac medications before your next appointment, please call your pharmacy.

## 2016-06-21 ENCOUNTER — Encounter: Payer: Self-pay | Admitting: Family Medicine

## 2016-08-26 ENCOUNTER — Other Ambulatory Visit: Payer: Self-pay | Admitting: *Deleted

## 2016-08-26 NOTE — Telephone Encounter (Signed)
Electronic refill request. Last Filled:    30 each 1 02/01/2016  Please advise.

## 2016-08-27 MED ORDER — NONFORMULARY OR COMPOUNDED ITEM: 1 refills | Status: DC

## 2016-08-27 NOTE — Telephone Encounter (Signed)
Printed.  Thanks.  

## 2016-08-29 NOTE — Telephone Encounter (Signed)
Faxed to [facility].

## 2016-10-12 ENCOUNTER — Other Ambulatory Visit: Payer: PPO

## 2016-10-14 ENCOUNTER — Ambulatory Visit: Payer: PPO | Admitting: Family Medicine

## 2016-10-18 ENCOUNTER — Other Ambulatory Visit (INDEPENDENT_AMBULATORY_CARE_PROVIDER_SITE_OTHER): Payer: PPO

## 2016-10-18 DIAGNOSIS — E119 Type 2 diabetes mellitus without complications: Secondary | ICD-10-CM

## 2016-10-18 DIAGNOSIS — Z79899 Other long term (current) drug therapy: Secondary | ICD-10-CM

## 2016-10-18 LAB — BASIC METABOLIC PANEL
BUN: 17 mg/dL (ref 6–23)
CALCIUM: 9.6 mg/dL (ref 8.4–10.5)
CO2: 31 meq/L (ref 19–32)
CREATININE: 1.35 mg/dL (ref 0.40–1.50)
Chloride: 102 mEq/L (ref 96–112)
GFR: 55.51 mL/min — ABNORMAL LOW (ref >60.00)
GLUCOSE: 152 mg/dL — AB (ref 70–99)
Potassium: 5 mEq/L (ref 3.5–5.1)
Sodium: 139 mEq/L (ref 135–145)

## 2016-10-18 LAB — PSA, MEDICARE: PSA: 3.13 ng/ml (ref 0.10–4.00)

## 2016-10-18 LAB — HEMOGLOBIN A1C: Hgb A1c MFr Bld: 7.7 % — ABNORMAL HIGH (ref 4.6–6.5)

## 2016-10-18 LAB — TESTOSTERONE: Testosterone: 747.49 ng/dL (ref 300.00–890.00)

## 2016-10-21 ENCOUNTER — Ambulatory Visit (INDEPENDENT_AMBULATORY_CARE_PROVIDER_SITE_OTHER): Payer: PPO | Admitting: Family Medicine

## 2016-10-21 ENCOUNTER — Encounter: Payer: Self-pay | Admitting: Family Medicine

## 2016-10-21 VITALS — BP 122/80 | HR 93 | Temp 98.6°F | Wt 180.0 lb

## 2016-10-21 DIAGNOSIS — E1165 Type 2 diabetes mellitus with hyperglycemia: Secondary | ICD-10-CM

## 2016-10-21 DIAGNOSIS — E291 Testicular hypofunction: Secondary | ICD-10-CM | POA: Diagnosis not present

## 2016-10-21 DIAGNOSIS — IMO0001 Reserved for inherently not codable concepts without codable children: Secondary | ICD-10-CM

## 2016-10-21 MED ORDER — NONFORMULARY OR COMPOUNDED ITEM: 5 refills | Status: DC

## 2016-10-21 NOTE — Progress Notes (Signed)
Diabetes:  Using medications without difficulties:yes Hypoglycemic episodes:no Hyperglycemic episodes:no Feet problems:no Blood Sugars averaging: higher around the holidays, now better with better diet adherence, down to 140s recently.   eye exam within last year: yes Weight up some, over the holidays.  Working more hours over the holidays.  D/w pt.   A1c up in the meantime.  He has plans for weight loss, d/w pt.   Relative PSA elevation prev.  Recheck PSA lower on recent labs. Still on testosterone replacement.  D/w pt.  T leval acceptable.   Still with some ED but functional as is.  Sildenafil didn't help, up to 100mg  per dose.    Meds, vitals, and allergies reviewed.   ROS: Per HPI unless specifically indicated in ROS section   GEN: nad, alert and oriented HEENT: mucous membranes moist NECK: supple w/o LA CV: rrr. PULM: ctab, no inc wob ABD: soft, +bs EXT: no edema

## 2016-10-21 NOTE — Patient Instructions (Signed)
Keep working on your diet and recheck in about 6 months at a physical/annual visit with labs ahead of time.  Take care.  Glad to see you.

## 2016-10-21 NOTE — Assessment & Plan Note (Signed)
Needs diet and weight loss more than med change, d/w pt.  He agrees.  Recheck in about 6 months.

## 2016-10-21 NOTE — Assessment & Plan Note (Signed)
T level wnl, wouldn't inc replacement.  PSA improved. D/w pt about risk/benefits of T tx.  He wants to continue.  He attributes his remaining erectile fxn to ongoing treatment.  Recheck in about 6 months.  He agrees.

## 2017-01-02 DIAGNOSIS — H35371 Puckering of macula, right eye: Secondary | ICD-10-CM | POA: Diagnosis not present

## 2017-01-02 DIAGNOSIS — H43811 Vitreous degeneration, right eye: Secondary | ICD-10-CM | POA: Diagnosis not present

## 2017-02-11 ENCOUNTER — Other Ambulatory Visit: Payer: Self-pay | Admitting: Family Medicine

## 2017-02-21 ENCOUNTER — Other Ambulatory Visit: Payer: Self-pay | Admitting: Family Medicine

## 2017-03-08 ENCOUNTER — Other Ambulatory Visit: Payer: Self-pay | Admitting: *Deleted

## 2017-03-08 NOTE — Telephone Encounter (Signed)
Last office visit 10/21/2016.  Last refilled 10/21/2016 for 30 each with 5 refills.  Ok to refill?

## 2017-03-09 MED ORDER — NONFORMULARY OR COMPOUNDED ITEM: 5 refills | Status: DC

## 2017-03-09 NOTE — Telephone Encounter (Signed)
Please call in.  Thanks.   

## 2017-03-09 NOTE — Telephone Encounter (Signed)
Medication phoned to pharmacy.  

## 2017-04-10 ENCOUNTER — Other Ambulatory Visit: Payer: Self-pay | Admitting: Family Medicine

## 2017-04-10 DIAGNOSIS — IMO0001 Reserved for inherently not codable concepts without codable children: Secondary | ICD-10-CM

## 2017-04-10 DIAGNOSIS — E1165 Type 2 diabetes mellitus with hyperglycemia: Principal | ICD-10-CM

## 2017-04-10 DIAGNOSIS — E291 Testicular hypofunction: Secondary | ICD-10-CM

## 2017-04-11 ENCOUNTER — Other Ambulatory Visit (INDEPENDENT_AMBULATORY_CARE_PROVIDER_SITE_OTHER): Payer: PPO

## 2017-04-11 DIAGNOSIS — E291 Testicular hypofunction: Secondary | ICD-10-CM

## 2017-04-11 DIAGNOSIS — E1165 Type 2 diabetes mellitus with hyperglycemia: Secondary | ICD-10-CM

## 2017-04-11 DIAGNOSIS — IMO0001 Reserved for inherently not codable concepts without codable children: Secondary | ICD-10-CM

## 2017-04-11 LAB — COMPREHENSIVE METABOLIC PANEL
ALBUMIN: 4.3 g/dL (ref 3.5–5.2)
ALK PHOS: 65 U/L (ref 39–117)
ALT: 22 U/L (ref 0–53)
AST: 22 U/L (ref 0–37)
BUN: 16 mg/dL (ref 6–23)
CALCIUM: 9.7 mg/dL (ref 8.4–10.5)
CO2: 32 mEq/L (ref 19–32)
Chloride: 103 mEq/L (ref 96–112)
Creatinine, Ser: 1.34 mg/dL (ref 0.40–1.50)
GFR: 55.91 mL/min — AB (ref >60.00)
Glucose, Bld: 147 mg/dL — ABNORMAL HIGH (ref 70–99)
POTASSIUM: 4.6 meq/L (ref 3.5–5.1)
Sodium: 141 mEq/L (ref 135–145)
TOTAL PROTEIN: 6.6 g/dL (ref 6.0–8.3)
Total Bilirubin: 0.6 mg/dL (ref 0.2–1.2)

## 2017-04-11 LAB — LIPID PANEL
CHOLESTEROL: 136 mg/dL (ref 0–200)
HDL: 58.1 mg/dL (ref >39.00)
LDL Cholesterol: 69 mg/dL (ref 0–99)
NonHDL: 77.92
Total CHOL/HDL Ratio: 2
Triglycerides: 47 mg/dL (ref 0.0–149.0)
VLDL: 9.4 mg/dL (ref 0.0–40.0)

## 2017-04-11 LAB — CBC WITH DIFFERENTIAL/PLATELET
Basophils Absolute: 0.1 10*3/uL (ref 0.0–0.1)
Basophils Relative: 0.9 % (ref 0.0–3.0)
EOS PCT: 2.3 % (ref 0.0–5.0)
Eosinophils Absolute: 0.1 10*3/uL (ref 0.0–0.7)
HCT: 45.3 % (ref 39.0–52.0)
Hemoglobin: 15.2 g/dL (ref 13.0–17.0)
LYMPHS ABS: 1.6 10*3/uL (ref 0.7–4.0)
Lymphocytes Relative: 28.3 % (ref 12.0–46.0)
MCHC: 33.5 g/dL (ref 30.0–36.0)
MCV: 88.6 fl (ref 78.0–100.0)
MONOS PCT: 10.2 % (ref 3.0–12.0)
Monocytes Absolute: 0.6 10*3/uL (ref 0.1–1.0)
NEUTROS PCT: 58.3 % (ref 43.0–77.0)
Neutro Abs: 3.3 10*3/uL (ref 1.4–7.7)
Platelets: 224 10*3/uL (ref 150.0–400.0)
RBC: 5.12 Mil/uL (ref 4.22–5.81)
RDW: 13.8 % (ref 11.5–15.5)
WBC: 5.6 10*3/uL (ref 4.0–10.5)

## 2017-04-11 LAB — TESTOSTERONE: Testosterone: 838.9 ng/dL (ref 300.00–890.00)

## 2017-04-11 LAB — HEMOGLOBIN A1C: HEMOGLOBIN A1C: 7.6 % — AB (ref 4.6–6.5)

## 2017-04-11 LAB — PSA, MEDICARE: PSA: 3.3 ng/ml (ref 0.10–4.00)

## 2017-04-20 ENCOUNTER — Ambulatory Visit (INDEPENDENT_AMBULATORY_CARE_PROVIDER_SITE_OTHER): Payer: PPO | Admitting: Family Medicine

## 2017-04-20 ENCOUNTER — Encounter: Payer: Self-pay | Admitting: Family Medicine

## 2017-04-20 DIAGNOSIS — IMO0001 Reserved for inherently not codable concepts without codable children: Secondary | ICD-10-CM

## 2017-04-20 DIAGNOSIS — I10 Essential (primary) hypertension: Secondary | ICD-10-CM

## 2017-04-20 DIAGNOSIS — E291 Testicular hypofunction: Secondary | ICD-10-CM

## 2017-04-20 DIAGNOSIS — E1165 Type 2 diabetes mellitus with hyperglycemia: Secondary | ICD-10-CM

## 2017-04-20 DIAGNOSIS — E785 Hyperlipidemia, unspecified: Secondary | ICD-10-CM

## 2017-04-20 MED ORDER — ENALAPRIL MALEATE 10 MG PO TABS: 10.0000 mg | ORAL_TABLET | Freq: Every day | ORAL | 12 refills | Status: DC

## 2017-04-20 MED ORDER — LOVASTATIN 20 MG PO TABS: 20.0000 mg | ORAL_TABLET | Freq: Every day | ORAL | 12 refills | Status: DC

## 2017-04-20 MED ORDER — PIOGLITAZONE HCL 15 MG PO TABS: 15.0000 mg | ORAL_TABLET | Freq: Every day | ORAL | 12 refills | Status: DC

## 2017-04-20 MED ORDER — NONFORMULARY OR COMPOUNDED ITEM: 5 refills | Status: DC

## 2017-04-20 MED ORDER — METFORMIN HCL 1000 MG PO TABS: 1000.0000 mg | ORAL_TABLET | Freq: Every day | ORAL | 12 refills | Status: DC

## 2017-04-20 NOTE — Patient Instructions (Addendum)
Don't change your meds for now.  Recheck in about 6 months.  Labs ahead of a physical.  Keep working on your weight.  Take care.  Glad to see you.  Update me as needed.  I would get a flu shot each fall.

## 2017-04-20 NOTE — Progress Notes (Signed)
Diabetes:  Using medications without difficulties: yes Hypoglycemic episodes:no Hyperglycemic episodes:no Feet problems:no Blood Sugars averaging: ~140 usually eye exam within last year:yes Diet d/w pt, encouraged.  He is working on weight.   exercise d/w pt, encouraged.   Labs d/w pt.  He feels wel.    Elevated Cholesterol: Using medications without problems:yes Muscle aches: no Diet compliance:encouraged Exercise: encouraged Labs d/w pt.   Hypertension:    Using medication without problems or lightheadedness: yes except for rarely lightheaded- not bothersome.   Chest pain with exertion:no Edema:no Short of breath:no  T replacement.  No skin irritation.  Minimal to no LUTS, rare nocturia.  PSA d/w pt.  Routine risk and benefit of T replacement d/w pt.  He felt better on med and wanted to continue.    PMH and SH reviewed  Meds, vitals, and allergies reviewed.   ROS: Per HPI unless specifically indicated in ROS section   GEN: nad, alert and oriented HEENT: mucous membranes moist NECK: supple w/o LA CV: rrr. PULM: ctab, no inc wob ABD: soft, +bs EXT: no edema SKIN: no acute rash  Diabetic foot exam: Normal inspection No skin breakdown No calluses  Normal DP pulses Normal sensation to light touch and monofilament Nails normal except for chronic L 1st nail changes.

## 2017-04-21 ENCOUNTER — Other Ambulatory Visit: Payer: Self-pay | Admitting: Family Medicine

## 2017-04-21 ENCOUNTER — Encounter: Payer: Self-pay | Admitting: Family Medicine

## 2017-04-21 NOTE — Assessment & Plan Note (Signed)
Stable, continue work on weight loss.  Recheck in about 6 months.  Labs d/w pt.

## 2017-04-21 NOTE — Assessment & Plan Note (Signed)
T level wnl, he feels better on replacement.  Routine risk and benefit of T replacement d/w pt. PSA stable.  He wants to continue with replacement. Continue as is.  rx called in.

## 2017-04-21 NOTE — Assessment & Plan Note (Signed)
Controlled, continue as is.  Labs d/w pt.  He agrees. Continue work on diet and exercise.

## 2017-05-18 ENCOUNTER — Other Ambulatory Visit: Payer: Self-pay | Admitting: Family Medicine

## 2017-05-18 DIAGNOSIS — H35371 Puckering of macula, right eye: Secondary | ICD-10-CM | POA: Diagnosis not present

## 2017-05-18 DIAGNOSIS — H43811 Vitreous degeneration, right eye: Secondary | ICD-10-CM | POA: Diagnosis not present

## 2017-05-21 NOTE — Progress Notes (Signed)
Cardiology Office Note  Date:  05/22/2017   ID:  Theodore Estes, Theodore Estes [birth year], MRN [id number]  PCP:  Tonia Ghent, MD   Chief Complaint  Patient presents with  . other    12 month f/u no complaints today. Meds reviewed verbally with pt.    HPI:  Theodore Estes is a pleasant 71 year old gentleman with history of hyperlipidemia,  diabetes,  hypertension  who presents for routine followup of his hyperlipidemia and cardiac risk factors  In general he reports that he is been doing well.  No regular exercise program. No chest pain or shortness of breath on exertion Previously reported having poor sleep Able to get to sleep but cannot stay asleep. Wakes up at 3 AM  Recently walked [place] 5[place] on the beach up and [place] the [place], Did okay  Lab work reviewed with him Total chol 131, LDL 72 HbA1C 7.6 Weight is stable  EKG on today's visit shows normal sinus rhythm with rate 84 bpm, no significant ST or T-wave changes  Other past medical history  stress test x2 in the past, one within the past several years.  He is relatively active, is able to walk fast.  Trying to lose weight to help his diabetes.  Hemoglobin A1c is down from 7.8 to 7.2 Prior lab work Total cholesterol 135, LDL 79   PMH:   has a past medical history of Diabetes mellitus without complication ([facility]); Dyspnea; Erectile dysfunction; History of colonic polyps; Hyperlipidemia; Hypertension; and Hypotestosteronism.  PSH:    Past Surgical History:  Procedure Laterality Date  . COLONOSCOPY  08/18/2003  . cyst excised from right wrist  2000  . VASECTOMY  1977    Current Outpatient Prescriptions  Medication Sig Dispense Refill  . aspirin 81 MG tablet Take 81 mg by mouth daily.    . Blood Glucose Monitoring Suppl (ONE TOUCH ULTRA 2) w/Device KIT Check blood sugar twice a day and as directed. Dx. E11.65 1 each 0  . enalapril (VASOTEC) 10 MG tablet Take 1 tablet (10 mg total) by mouth daily. 30 tablet 12  . lovastatin  (MEVACOR) 20 MG tablet Take 1 tablet (20 mg total) by mouth daily. 30 tablet 12  . metFORMIN (GLUCOPHAGE) 1000 MG tablet Take 1 tablet (1,000 mg total) by mouth daily with breakfast. 30 tablet 12  . NONFORMULARY OR COMPOUNDED ITEM Testosterone 5% cream.  Apply 46m per day. 361m30 each 5  . ONE TOUCH ULTRA TEST test strip USE TO CHECK BLOOD SUGAR TWICE DAILY AND AS DIRECTED 100 each 11  . ONETOUCH DELICA LANCETS 3334VISC Check blood sugar twice a day and as directed. Dx. E11.65 100 each 5  . pioglitazone (ACTOS) 15 MG tablet Take 1 tablet (15 mg total) by mouth daily. 30 tablet 12   No current facility-administered medications for this visit.      Allergies:   Patient has no known allergies.   Social History:  The patient  reports that he has never smoked. He has never used smokeless tobacco. He reports that he does not drink alcohol or use drugs.   Family History:   family history includes Cancer in his mother; Colon cancer (age of onset: 8072in his mother; Diabetes in his brother; Heart disease in his father; Hyperlipidemia in his sister; Hypertension in his brother.    Review of Systems: Review of Systems  Constitutional: Negative.   Respiratory: Negative.   Cardiovascular: Negative.   Gastrointestinal: Negative.   Musculoskeletal: Negative.  Neurological: Negative.   Psychiatric/Behavioral: Negative.        Sleep disorder  All other systems reviewed and are negative.    PHYSICAL EXAM: VS:  BP 130/70 (BP Location: Left Arm, Patient Position: Sitting, Cuff Size: Normal)   Pulse 84   Ht _0  (1.676 m)   Wt 184 lb 8 oz (83.7 kg)   BMI 29.78 kg/m  , BMI Body mass index is 29.78 kg/m. GEN: Well nourished, well developed, in no acute distress  HEENT: normal  Neck: no JVD, carotid bruits, or masses Cardiac: RRR; no murmurs, rubs, or gallops,no edema  Respiratory:  clear to auscultation bilaterally, normal work of breathing GI: soft, nontender, nondistended, + BS MS: no  deformity or atrophy  Skin: warm and dry, no rash Neuro:  Strength and sensation are intact Psych: euthymic mood, full affect    Recent Labs: 04/11/2017: ALT 22; BUN 16; Creatinine, Ser 1.34; Hemoglobin 15.2; Platelets 224.0; Potassium 4.6; Sodium 141    Lipid Panel Lab Results  Component Value Date   CHOL 136 04/11/2017   HDL 58.10 04/11/2017   LDLCALC 69 04/11/2017   TRIG 47.0 04/11/2017      Wt Readings from Last 3 Encounters:  05/22/17 184 lb 8 oz (83.7 kg)  04/20/17 182 lb 4 oz (82.7 kg)  10/21/16 180 lb (81.6 kg)       ASSESSMENT AND PLAN:  Essential hypertension - Plan: EKG 12-Lead Blood pressure is well controlled on today's visit. No changes made to the medications.  HLD (hyperlipidemia) Cholesterol is at goal on the current lipid regimen. No changes to the medications were made.  Controlled type 2 diabetes mellitus without complication, without long-term current use of insulin ([place])  importance of aggressive diabetes control discussed with him in detail A1c 7.6 We have encouraged continued exercise, careful diet management in an effort to lose weight.  Discussed various imaging options for risk stratification such as CT coronary calcium scoring  Total encounter time more than 15 minutes  Greater than 50% was spent in counseling and coordination of care with the patient   Disposition:   F/U  12 months as needed   Orders Placed This Encounter  Procedures  . EKG 12-Lead     Signed, Esmond Plants, M.D., Ph.D. 05/22/2017  [place], [place]

## 2017-05-22 ENCOUNTER — Ambulatory Visit (INDEPENDENT_AMBULATORY_CARE_PROVIDER_SITE_OTHER): Payer: PPO | Admitting: Cardiovascular Disease

## 2017-05-22 ENCOUNTER — Encounter: Payer: Self-pay | Admitting: Cardiovascular Disease

## 2017-05-22 VITALS — BP 130/70 | HR 84 | Ht 66.0 in | Wt 184.5 lb

## 2017-05-22 DIAGNOSIS — IMO0001 Reserved for inherently not codable concepts without codable children: Secondary | ICD-10-CM

## 2017-05-22 DIAGNOSIS — E1165 Type 2 diabetes mellitus with hyperglycemia: Secondary | ICD-10-CM

## 2017-05-22 DIAGNOSIS — I10 Essential (primary) hypertension: Secondary | ICD-10-CM

## 2017-05-22 DIAGNOSIS — E782 Mixed hyperlipidemia: Secondary | ICD-10-CM

## 2017-05-22 NOTE — Patient Instructions (Signed)

## 2017-10-13 ENCOUNTER — Other Ambulatory Visit: Payer: Self-pay | Admitting: Family Medicine

## 2017-10-13 NOTE — Telephone Encounter (Signed)
Copied from [place] [id number]. Topic: Quick Communication - See Telephone Encounter >> Oct 13, 2017  3:04 PM Bea Graff, NT wrote: CRM for notification. See Telephone encounter for: Pt requesting refill of Testosterone 5% cream sent to [facility] in [place].  10/13/17.

## 2017-10-13 NOTE — Telephone Encounter (Signed)
Requesting refill of Testosterone 5% cream  LOV 04/20/17 with Dr. Damita Dunnings  Last refill 04/20/17

## 2017-10-13 NOTE — Telephone Encounter (Signed)
Testosterone cream was on hx med list; last refilled # 30 ml  X 5 on 03/09/17. Last seen 04/20/17.

## 2017-10-15 MED ORDER — NONFORMULARY OR COMPOUNDED ITEM: 5 refills | Status: DC

## 2017-10-15 NOTE — Telephone Encounter (Signed)
It should have printed to fax in.  I can't erx this rx the way it is written.  Thanks.

## 2017-10-16 NOTE — Telephone Encounter (Signed)
Faxed to [facility].

## 2017-11-01 DIAGNOSIS — H43811 Vitreous degeneration, right eye: Secondary | ICD-10-CM | POA: Diagnosis not present

## 2017-11-01 DIAGNOSIS — H35371 Puckering of macula, right eye: Secondary | ICD-10-CM | POA: Diagnosis not present

## 2018-02-27 ENCOUNTER — Other Ambulatory Visit: Payer: Self-pay | Admitting: *Deleted

## 2018-02-27 NOTE — Telephone Encounter (Signed)
Name of Medication: Testosterone 5% Cream Name of Pharmacy: [facility] or Written Date and Quantity:  69 each 5 10/15/2017  Last Office Visit and Type: 04/20/17 DM F/U Next Office Visit and Type: None Last Controlled Substance Agreement Date:  Last UDS:

## 2018-02-28 MED ORDER — NONFORMULARY OR COMPOUNDED ITEM: 2 refills | Status: DC

## 2018-02-28 NOTE — Telephone Encounter (Signed)
He is overdue for CPE and DME.  He may end up needing visit with [facility] first if on medicare.  Please fax in rx when I sign it.  Thanks.  Short term rx done, not 6 months supply.

## 2018-02-28 NOTE — Telephone Encounter (Signed)
Landon from [facility] left v/m; the testosterone 5% cream is compounded and the only pharmacy in [place] that compounds is [facility] in [place]. FYI to [place].

## 2018-02-28 NOTE — Telephone Encounter (Signed)
Appointment has been made for first available for Theodore Estes with Lattie Haw and Dr Damita Dunnings and lab appointment prior to that per patient's request-Jashayla Glatfelter V Zyann Mabry, RMA

## 2018-02-28 NOTE — Telephone Encounter (Signed)
Rx faxed to pharmacy as instructed.  Please call and schedule appointments as instructed.

## 2018-02-28 NOTE — Telephone Encounter (Signed)
Checked faxed refill request that came in and it was from [facility]. Script faxed to [facility].

## 2018-04-23 ENCOUNTER — Other Ambulatory Visit: Payer: Self-pay | Admitting: Family Medicine

## 2018-04-23 DIAGNOSIS — IMO0001 Reserved for inherently not codable concepts without codable children: Secondary | ICD-10-CM

## 2018-04-23 DIAGNOSIS — E291 Testicular hypofunction: Secondary | ICD-10-CM

## 2018-04-23 DIAGNOSIS — E1165 Type 2 diabetes mellitus with hyperglycemia: Principal | ICD-10-CM

## 2018-04-23 DIAGNOSIS — I10 Essential (primary) hypertension: Secondary | ICD-10-CM

## 2018-04-24 ENCOUNTER — Other Ambulatory Visit: Payer: Self-pay | Admitting: Family Medicine

## 2018-04-24 ENCOUNTER — Other Ambulatory Visit (INDEPENDENT_AMBULATORY_CARE_PROVIDER_SITE_OTHER): Payer: PPO

## 2018-04-24 DIAGNOSIS — I10 Essential (primary) hypertension: Secondary | ICD-10-CM

## 2018-04-24 DIAGNOSIS — E1165 Type 2 diabetes mellitus with hyperglycemia: Secondary | ICD-10-CM

## 2018-04-24 DIAGNOSIS — E291 Testicular hypofunction: Secondary | ICD-10-CM | POA: Diagnosis not present

## 2018-04-24 DIAGNOSIS — IMO0001 Reserved for inherently not codable concepts without codable children: Secondary | ICD-10-CM

## 2018-04-24 LAB — CBC WITH DIFFERENTIAL/PLATELET
BASOS ABS: 0.1 10*3/uL (ref 0.0–0.1)
BASOS PCT: 0.8 % (ref 0.0–3.0)
Eosinophils Absolute: 0.1 10*3/uL (ref 0.0–0.7)
Eosinophils Relative: 2.4 % (ref 0.0–5.0)
HEMATOCRIT: 44.9 % (ref 39.0–52.0)
Hemoglobin: 14.8 g/dL (ref 13.0–17.0)
LYMPHS PCT: 23.4 % (ref 12.0–46.0)
Lymphs Abs: 1.4 10*3/uL (ref 0.7–4.0)
MCHC: 33.1 g/dL (ref 30.0–36.0)
MCV: 89.5 fl (ref 78.0–100.0)
MONOS PCT: 10.5 % (ref 3.0–12.0)
Monocytes Absolute: 0.6 10*3/uL (ref 0.1–1.0)
NEUTROS ABS: 3.7 10*3/uL (ref 1.4–7.7)
Neutrophils Relative %: 62.9 % (ref 43.0–77.0)
PLATELETS: 226 10*3/uL (ref 150.0–400.0)
RBC: 5.02 Mil/uL (ref 4.22–5.81)
RDW: 13.9 % (ref 11.5–15.5)
WBC: 6 10*3/uL (ref 4.0–10.5)

## 2018-04-24 LAB — COMPREHENSIVE METABOLIC PANEL
ALBUMIN: 4.2 g/dL (ref 3.5–5.2)
ALT: 22 U/L (ref 0–53)
AST: 17 U/L (ref 0–37)
Alkaline Phosphatase: 57 U/L (ref 39–117)
BUN: 24 mg/dL — AB (ref 6–23)
CO2: 31 mEq/L (ref 19–32)
Calcium: 9.5 mg/dL (ref 8.4–10.5)
Chloride: 100 mEq/L (ref 96–112)
Creatinine, Ser: 1.51 mg/dL — ABNORMAL HIGH (ref 0.40–1.50)
GFR: 48.57 mL/min — AB (ref >60.00)
Glucose, Bld: 225 mg/dL — ABNORMAL HIGH (ref 70–99)
POTASSIUM: 5.2 meq/L — AB (ref 3.5–5.1)
SODIUM: 137 meq/L (ref 135–145)
Total Bilirubin: 0.7 mg/dL (ref 0.2–1.2)
Total Protein: 6.6 g/dL (ref 6.0–8.3)

## 2018-04-24 LAB — PSA, MEDICARE: PSA: 3.47 ng/ml (ref 0.10–4.00)

## 2018-04-24 LAB — LIPID PANEL
CHOLESTEROL: 126 mg/dL (ref 0–200)
HDL: 51.2 mg/dL (ref >39.00)
LDL Cholesterol: 54 mg/dL (ref 0–99)
NonHDL: 74.91
Total CHOL/HDL Ratio: 2
Triglycerides: 105 mg/dL (ref 0.0–149.0)
VLDL: 21 mg/dL (ref 0.0–40.0)

## 2018-04-24 LAB — TESTOSTERONE: Testosterone: 885.37 ng/dL (ref 300.00–890.00)

## 2018-04-24 LAB — HEMOGLOBIN A1C: Hgb A1c MFr Bld: 7.7 % — ABNORMAL HIGH (ref 4.6–6.5)

## 2018-04-27 ENCOUNTER — Ambulatory Visit: Payer: PPO

## 2018-04-27 ENCOUNTER — Encounter: Payer: Self-pay | Admitting: Family Medicine

## 2018-04-27 ENCOUNTER — Encounter: Payer: Self-pay | Admitting: *Deleted

## 2018-04-27 ENCOUNTER — Ambulatory Visit (INDEPENDENT_AMBULATORY_CARE_PROVIDER_SITE_OTHER): Payer: PPO | Admitting: Family Medicine

## 2018-04-27 VITALS — BP 122/86 | HR 86 | Temp 97.9°F | Ht 66.0 in | Wt 185.5 lb

## 2018-04-27 DIAGNOSIS — E291 Testicular hypofunction: Secondary | ICD-10-CM

## 2018-04-27 DIAGNOSIS — Z0000 Encounter for general adult medical examination without abnormal findings: Secondary | ICD-10-CM

## 2018-04-27 DIAGNOSIS — E119 Type 2 diabetes mellitus without complications: Secondary | ICD-10-CM

## 2018-04-27 DIAGNOSIS — E7800 Pure hypercholesterolemia, unspecified: Secondary | ICD-10-CM

## 2018-04-27 DIAGNOSIS — I10 Essential (primary) hypertension: Secondary | ICD-10-CM | POA: Diagnosis not present

## 2018-04-27 DIAGNOSIS — Z7189 Other specified counseling: Secondary | ICD-10-CM

## 2018-04-27 MED ORDER — METFORMIN HCL 1000 MG PO TABS: 1000.0000 mg | ORAL_TABLET | Freq: Every day | ORAL | 12 refills | Status: DC

## 2018-04-27 MED ORDER — NONFORMULARY OR COMPOUNDED ITEM: 5 refills | Status: DC

## 2018-04-27 NOTE — Patient Instructions (Addendum)
Check a1c in about 6 months at a visit.   The only lab you need to have done for your next diabetic visit is an A1c.  We can do this with a fingerstick test at the office visit.  You do not need a lab visit ahead of time for this.  It does not matter if you are fasting when the lab is done.   Update me sooner if needed.  I would get a flu shot each fall.   Take care.  Glad to see you.

## 2018-04-27 NOTE — Progress Notes (Signed)
Subjective:   Theodore Estes is a 72 y.o. male who presents for Medicare Annual/Subsequent preventive examination.  Review of Systems:  N/A Cardiac Risk Factors include: advanced age (>26mn, >>34women);diabetes mellitus;male gender;hypertension;dyslipidemia     Objective:    Vitals: BP 122/86 (BP Location: Right Arm, Patient Position: Sitting, Cuff Size: Normal)   Pulse 86   Temp 97.9 F (36.6 C) (Oral)   Ht _0  (1.676 m) Comment: no shoes  Wt 185 lb 8 oz (84.1 kg)   SpO2 97%   BMI 29.94 kg/m   Body mass index is 29.94 kg/m.  Advanced Directives 04/27/2018 04/04/2016  Does Patient Have a Medical Advance Directive? Yes Yes  Type of AParamedicof [place] will Living will  Does patient want to make changes to medical advance directive? - No - Patient declined  [place]? No - copy requested No - copy requested    Tobacco Social History   Tobacco Use  Smoking Status Never Smoker  Smokeless Tobacco Never Used     Counseling given: No   Clinical Intake:  Pre-visit preparation completed: Yes  Pain : No/denies pain Pain Score: 0-No pain     Nutritional Status: BMI 25 -29 Overweight Nutritional Risks: None Diabetes: Yes CBG done?: No Did pt. bring in CBG monitor from home?: No  How often do you need to have someone help you when you read instructions, pamphlets, or other written materials from your doctor or pharmacy?: 1 - Never  Interpreter Needed?: No  Comments: pt lives with spouse Information entered by :: LPinson, LPN  Past Medical History:  Diagnosis Date  . Diabetes mellitus without complication [place]   . Dyspnea   . Erectile dysfunction   . History of colonic polyps   . Hyperlipidemia   . Hypertension   . Hypotestosteronism    Past Surgical History:  Procedure Laterality Date  . COLONOSCOPY  08/18/2003  . cyst excised from right wrist  2000  . VASECTOMY  1977   Family  History  Problem Relation Age of Onset  . Cancer Mother        colon  . Colon cancer Mother 857 . Heart disease Father   . Diabetes Brother   . Hypertension Brother   . Hyperlipidemia Sister   . Prostate cancer Neg Hx    Social History   Socioeconomic History  . Marital status: Married    Spouse name: Not on file  . Number of children: Not on file  . Years of education: Not on file  . Highest education level: Not on file  Occupational History  . Not on file  Social Needs  . Financial resource strain: Not on file  . Food insecurity:    Worry: Not on file    Inability: Not on file  . Transportation needs:    Medical: Not on file    Non-medical: Not on file  Tobacco Use  . Smoking status: Never Smoker  . Smokeless tobacco: Never Used  Substance and Sexual Activity  . Alcohol use: No    Alcohol/week: 0.0 oz  . Drug use: No  . Sexual activity: Yes    Partners: Female  Lifestyle  . Physical activity:    Days per week: Not on file    Minutes per session: Not on file  . Stress: Not on file  Relationships  . Social connections:    Talks on phone: Not on file  Gets together: Not on file    Attends religious service: Not on file    Active member of club or organization: Not on file    Attends meetings of clubs or organizations: Not on file    Relationship status: Not on file  Other Topics Concern  . Not on file  Social History Narrative   HSG, [employer] jeweler   Married '69   2 daughters '76 '77' 5 grandchildren   Marriage in good health    Outpatient Encounter Medications as of 04/27/2018  Medication Sig  . aspirin 81 MG tablet Take 81 mg by mouth daily.  . Blood Glucose Monitoring Suppl (ONE TOUCH ULTRA 2) w/Device KIT Check blood sugar twice a day and as directed. Dx. E11.65  . enalapril (VASOTEC) 10 MG tablet TAKE 1 TABLET BY MOUTH ONCE DAILY  . lovastatin (MEVACOR) 20 MG tablet TAKE 1 TABLET BY MOUTH ONCE DAILY  . metFORMIN (GLUCOPHAGE)  1000 MG tablet Take 1 tablet (1,000 mg total) by mouth daily with breakfast.  . NONFORMULARY OR COMPOUNDED ITEM Testosterone 5% cream.  Apply 12m per day. 3170m . NONFORMULARY OR COMPOUNDED ITEM Testosterone 5% cream.  Apply 70m67mer day. 52m770m ONE TOUCH ULTRA TEST test strip USE TO CHECK BLOOD SUGAR TWICE DAILY AND AS DIRECTED  . ONETOUCH DELICA LANCETS 33G 01UC Check blood sugar twice a day and as directed. Dx. E11.65  . pioglitazone (ACTOS) 15 MG tablet TAKE 1 TABLET BY MOUTH ONCE DAILY   No facility-administered encounter medications on file as of 04/27/2018.     Activities of Daily Living In your present state of health, do you have any difficulty performing the following activities: 04/27/2018  Hearing? Y  Vision? N  Difficulty concentrating or making decisions? N  Walking or climbing stairs? Y  Dressing or bathing? N  Doing errands, shopping? N  Preparing Food and eating ? N  Using the Toilet? N  In the past six months, have you accidently leaked urine? N  Do you have problems with loss of bowel control? N  Managing your Medications? N  Managing your Finances? N  Housekeeping or managing your Housekeeping? N  Some recent data might be hidden    Patient Care Team: DuncTonia Ghent as PCP - General (Family Medicine) GollMinna Merritts as Consulting Physician (Cardiology)   Assessment:   This is a routine wellness examination for [name] Screening   _0  _1  _2  _3  _4  _5  _6  _7  _8   Right ear:   40 40 40  0    Left ear:   40 40 40  0    Vision Screening Comments: Vision exam in August 2018 with Dr. 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 Patient Stated     Starting 04/27/2018, I will continue to take  medications as prescribed.         Fall Risk Fall Risk  04/27/2018 04/20/2017 04/04/2016 02/11/2015 01/02/2014  Falls in the past year? No No Yes Yes No  Comment - - fell off ladder a few times while working - -  Number falls in past yr: - - 2 or more 1 -  Injury with Fall? - - No Yes -  Risk for fall due  to : - - - Other (Comment) -  Follow up - - Falls evaluation completed - -   Depression Screen PHQ 2/9 Scores 04/27/2018 04/04/2016 02/11/2015 01/02/2014  PHQ - 2 Score 0 0 0 0  PHQ- 9 Score 0 - - -    Cognitive Function MMSE - Mini Mental State Exam 04/27/2018 04/04/2016  Orientation to time 5 5  Orientation to Place 5 5  Registration 3 3  Attention/ Calculation 0 0  Recall 2 3  Recall-comments unable to recall 1 of 3 words -  Language- name 2 objects 0 0  Language- repeat 1 1  Language- follow 3 step command 3 3  Language- read & follow direction 0 0  Write a sentence 0 0  Copy design 0 0  Total score 19 20     PLEASE NOTE: A Mini-Cog screen was completed. Maximum score is 20. A value of 0 denotes this part of Folstein MMSE was not completed or the patient failed this part of the Mini-Cog screening.   Mini-Cog Screening Orientation to Time - Max 5 pts Orientation to Place - Max 5 pts Registration - Max 3 pts Recall - Max 3 pts Language Repeat - Max 1 pts Language Follow 3 Step Command - Max 3 pts     Immunization History  Administered Date(s) Administered  . Influenza Whole 06/26/2008, 06/26/2009, 10/19/2011  . Influenza, Seasonal, Injecte, Preservative Fre 07/05/2016  . Influenza,inj,Quad PF,6+ Mos 07/04/2014, 08/19/2015  . Influenza-Unspecified 07/27/2013, 08/19/2017  . Pneumococcal Conjugate-13 02/11/2015  . Pneumococcal Polysaccharide-23 01/27/2012  . Tdap 01/27/2012  . Zoster 06/26/2008   Screening Tests Health Maintenance  Topic Date Due  . FOOT EXAM  04/27/2018 (Originally 04/20/2018)  . INFLUENZA VACCINE  12/25/2018 (Originally 04/26/2018)  . OPHTHALMOLOGY  EXAM  05/17/2018  . HEMOGLOBIN A1C  10/25/2018  . TETANUS/TDAP  01/26/2022  . COLONOSCOPY  01/10/2024  . Hepatitis C Screening  Completed  . PNA vac Low Risk Adult  Completed       Plan:     I have personally reviewed, addressed, and noted the following in the patient's chart:  A. Medical and social history B. Use of alcohol, tobacco or illicit drugs  C. Current medications and supplements D. Functional ability and status E.  Nutritional status F.  Physical activity G. Advance directives H. List of other physicians I.  Hospitalizations, surgeries, and ER visits in previous 12 months J.  [place] to include hearing, vision, cognitive, depression L. Referrals and appointments - none  In addition, I have reviewed and discussed with patient certain preventive protocols, quality metrics, and best practice recommendations. A written personalized care plan for preventive services as well as general preventive health recommendations were provided to patient.  See attached scanned questionnaire for additional information.   Signed,   Lindell Noe, MHA, BS, LPN Health Coach

## 2018-04-27 NOTE — Progress Notes (Signed)
Diabetes:  Using medications without difficulties: yes Hypoglycemic episodes:no Hyperglycemic episodes:no Feet problems:no Blood Sugars averaging: ~155 usually per patient report.   eye exam within last year: yes, f/u pending.   We talked about actos.  At this point, there is no clear evidence to direct a change in meds (re: actos).  Med is tolerated (no h/o bladder CA and no h/o blood in urine) and benefit from control of DM2 outweighs other considerations.  Pt agrees to continue actos.    Hypertension:    Using medication without problems or lightheadedness: yes Chest pain with exertion:no Edema:no Short of breath:no  Elevated Cholesterol: Using medications without problems: yes Muscle aches: no Diet compliance: encouraged. Exercise: encouraged.   Testosterone replacement.  PSA wnl.   CBC okay.   Labs d/w pt.  Cr at baseline of 1.3-1.5. D/w pt about risk of use vs benefit.  His energy level was better on med.  It prev helped with ED but not recently.  viagra didn't help.  D/w pt.    Declined hearing aids.   Recall d/w pt.  He is still working and doing well with that.  3/3 recall on recheck.  Unlikely that he has sig memory changes.   Living will d/w pt. Wife designated if patient were incapacitated.   Colonoscopy 2015  PMH and SH reviewed.   Vital signs, Meds and allergies reviewed.  ROS: Per HPI unless specifically indicated in ROS section   GEN: nad, alert and oriented HEENT: mucous membranes moist NECK: supple w/o LA CV: rrr.  no murmur PULM: ctab, no inc wob ABD: soft, +bs EXT: no edema SKIN: no acute rash  Diabetic foot exam: Normal inspection No skin breakdown No calluses  Normal DP pulses Normal sensation to light tough and monofilament Nails normal

## 2018-04-27 NOTE — Progress Notes (Signed)
PCP notes:   Health maintenance:  Foot exam - PCP please address at next appt Flu vaccine - addressed  Abnormal screenings:   Hearing - failed  Hearing Screening   125Hz  250Hz  500Hz  1000Hz  2000Hz  3000Hz  4000Hz  6000Hz  8000Hz   Right ear:   40 40 40  0    Left ear:   40 40 40  0     Mini-Cog score: 19/20 MMSE - Mini Mental State Exam 04/27/2018 04/04/2016  Orientation to time 5 5  Orientation to Place 5 5  Registration 3 3  Attention/ Calculation 0 0  Recall 2 3  Recall-comments unable to recall 1 of 3 words -  Language- name 2 objects 0 0  Language- repeat 1 1  Language- follow 3 step command 3 3  Language- read & follow direction 0 0  Write a sentence 0 0  Copy design 0 0  Total score 19 20    Patient concerns:   None  Nurse concerns:  None  Next PCP appt:   04/27/18 @ 0945  I reviewed health advisor's note, was available for consultation on the day of service listed in this note, and agree with documentation and plan. Elsie Stain, MD.

## 2018-04-27 NOTE — Patient Instructions (Signed)
Theodore Estes , Thank you for taking time to come for your Medicare Wellness Visit. I appreciate your ongoing commitment to your health goals. Please review the following plan we discussed and let me know if I can assist you in the future.   These are the goals we discussed: Goals    . Patient Stated     Starting 04/27/2018, I will continue to take medications as prescribed.         This is a list of the screening recommended for you and due dates:  Health Maintenance  Topic Date Due  . Complete foot exam   04/27/2018*  . Flu Shot  12/25/2018*  . Eye exam for diabetics  05/17/2018  . Hemoglobin A1C  10/25/2018  . Tetanus Vaccine  01/26/2022  . Colon Cancer Screening  01/10/2024  .  Hepatitis C: One time screening is recommended by Center for Disease Control  (CDC) for  adults born from [birth year] through 1965.   Completed  . Pneumonia vaccines  Completed  *Topic was postponed. The date shown is not the original due date.   Preventive Care for Adults  A healthy lifestyle and preventive care can promote health and wellness. Preventive health guidelines for adults include the following key practices.  . A routine yearly physical is a good way to check with your health care provider about your health and preventive screening. It is a chance to share any concerns and updates on your health and to receive a thorough exam.  . Visit your dentist for a routine exam and preventive care every 6 months. Brush your teeth twice a day and floss once a day. Good oral hygiene prevents tooth decay and gum disease.  . The frequency of eye exams is based on your age, health, family medical history, use  of contact lenses, and other factors. Follow your health care provider's recommendations for frequency of eye exams.  . Eat a healthy diet. Foods like vegetables, fruits, whole grains, low-fat dairy products, and lean protein foods contain the nutrients you need without too many calories. Decrease your intake  of foods high in solid fats, added sugars, and salt. Eat the right amount of calories for you. Get information about a proper diet from your health care provider, if necessary.  . Regular physical exercise is one of the most important things you can do for your health. Most adults should get at least 150 minutes of moderate-intensity exercise (any activity that increases your heart rate and causes you to sweat) each week. In addition, most adults need muscle-strengthening exercises on 2 or more days a week.  Silver Sneakers may be a benefit available to you. To determine eligibility, you may visit the website: www.silversneakers.com or contact program at 727 534 1386 Mon-Fri between 8AM-8PM.   . Maintain a healthy weight. The body mass index (BMI) is a screening tool to identify possible weight problems. It provides an estimate of body fat based on height and weight. Your health care provider can find your BMI and can help you achieve or maintain a healthy weight.   For adults 20 years and older: ? A BMI below 18.5 is considered underweight. ? A BMI of 18.5 to 24.9 is normal. ? A BMI of 25 to 29.9 is considered overweight. ? A BMI of 30 and above is considered obese.   . Maintain normal blood lipids and cholesterol levels by exercising and minimizing your intake of saturated fat. Eat a balanced diet with plenty of fruit and vegetables.  Blood tests for lipids and cholesterol should begin at age 44 and be repeated every 5 years. If your lipid or cholesterol levels are high, you are over 50, or you are at high risk for heart disease, you may need your cholesterol levels checked more frequently. Ongoing high lipid and cholesterol levels should be treated with medicines if diet and exercise are not working.  . If you smoke, find out from your health care provider how to quit. If you do not use tobacco, please do not start.  . If you choose to drink alcohol, please do not consume more than 2 drinks  per day. One drink is considered to be 12 ounces (355 mL) of beer, 5 ounces (148 mL) of wine, or 1.5 ounces (44 mL) of liquor.  . If you are 39-39 years old, ask your health care provider if you should take aspirin to prevent strokes.  . Use sunscreen. Apply sunscreen liberally and repeatedly throughout the day. You should seek shade when your shadow is shorter than you. Protect yourself by wearing long sleeves, pants, a wide-brimmed hat, and sunglasses year round, whenever you are outdoors.  . Once a month, do a whole body skin exam, using a mirror to look at the skin on your back. Tell your health care provider of new moles, moles that have irregular borders, moles that are larger than a pencil eraser, or moles that have changed in shape or color.

## 2018-04-28 NOTE — Assessment & Plan Note (Signed)
Reasonable control.  No change in meds.  Continue work on diet and exercise.  He agrees. 

## 2018-04-28 NOTE — Assessment & Plan Note (Signed)
Declined hearing aids.   Recall d/w pt.  He is still working and doing well with that.  3/3 recall on recheck.  Unlikely that he has sig memory changes.   Living will d/w pt. Wife designated if patient were incapacitated.   Colonoscopy 2015

## 2018-04-28 NOTE — Assessment & Plan Note (Signed)
Living will d/w pt.  Wife designated if patient were incapacitated.   ?

## 2018-04-28 NOTE — Assessment & Plan Note (Addendum)
Reasonable control for now.  No change in meds.  See above.  Continue work on diet and exercise.  Recheck periodically.  He agrees.  See after visit summary.

## 2018-04-28 NOTE — Assessment & Plan Note (Signed)
Reasonable control.  No change in meds.  Continue work on diet and exercise.  He agrees. Labs d/w pt.

## 2018-04-28 NOTE — Assessment & Plan Note (Signed)
On Testosterone replacement.  PSA wnl.   CBC okay.   Labs d/w pt.  Cr at baseline of 1.3-1.5. D/w pt about risk of use vs benefit.  His energy level was better on med.  It prev helped with ED but not recently.  viagra didn't help.  D/w pt.  Continue as is for now.  He agrees.

## 2018-05-07 ENCOUNTER — Telehealth: Payer: Self-pay | Admitting: Family Medicine

## 2018-05-07 NOTE — Telephone Encounter (Signed)
Notify pt.   The question was about the timeline for colonoscopy f/u.  Mother was diagnosed at 3.  Newest guidelines recommend 5 year screening if 1st degree relative was diagnosed with cancer at or before age 72.  In his case, 10 year f/u seems reasonable.  Thanks.

## 2018-05-07 NOTE — Telephone Encounter (Signed)
Patient advised.

## 2018-05-18 DIAGNOSIS — H35371 Puckering of macula, right eye: Secondary | ICD-10-CM | POA: Diagnosis not present

## 2018-05-18 DIAGNOSIS — H43811 Vitreous degeneration, right eye: Secondary | ICD-10-CM | POA: Diagnosis not present

## 2018-05-18 DIAGNOSIS — E119 Type 2 diabetes mellitus without complications: Secondary | ICD-10-CM | POA: Diagnosis not present

## 2018-05-31 ENCOUNTER — Telehealth: Payer: Self-pay | Admitting: Family Medicine

## 2018-05-31 MED ORDER — GLUCOSE BLOOD VI STRP: ORAL_STRIP | 11 refills | Status: DC

## 2018-05-31 MED ORDER — ONETOUCH ULTRA 2 W/DEVICE KIT: PACK | 0 refills | Status: AC

## 2018-05-31 NOTE — Telephone Encounter (Signed)
Patient requesting new monitor for glucose testing: One Touch Ultra 2 Patient will need testing supplies : strips , diagnosis code, and sig with Rx  LOV: 04/27/18  PCP: [place]: verified  Rx filled - device on medication list

## 2018-05-31 NOTE — Telephone Encounter (Signed)
Copied from [place] [id number]. Topic: Quick Communication - Rx Refill/Question >> May 31, 2018  9:59 AM Yvette Rack wrote: Theodore Estes states pt needs a new monitor because the one he has is broken and the insurance stated they would cover a new one.   Medication: Blood Glucose Monitoring Suppl (ONE TOUCH ULTRA 2) w/Device KIT  Preferred Pharmacy (with phone number or street name): [facility], [place] [phone] (Phone) [phone] (Fax)   Agent: Please be advised that RX refills may take up to 3 business days. We ask that you follow-up with your pharmacy.

## 2018-06-07 DIAGNOSIS — L02821 Furuncle of head [any part, except face]: Secondary | ICD-10-CM | POA: Diagnosis not present

## 2018-06-07 DIAGNOSIS — B9689 Other specified bacterial agents as the cause of diseases classified elsewhere: Secondary | ICD-10-CM | POA: Diagnosis not present

## 2018-09-14 ENCOUNTER — Other Ambulatory Visit: Payer: Self-pay | Admitting: *Deleted

## 2018-09-14 NOTE — Telephone Encounter (Signed)
Electronic refill request. Testosterone Lipoderm Cream Last office visit:   04/27/18 Last Filled:    30 each 5 04/27/2018  Please advise.

## 2018-09-16 MED ORDER — NONFORMULARY OR COMPOUNDED ITEM: 5 refills | Status: DC

## 2018-09-16 NOTE — Telephone Encounter (Signed)
Printed.  Please fax over.  I cannot ERx the prescription this way.  Thanks.

## 2018-09-17 NOTE — Telephone Encounter (Signed)
Faxed RX to pharmacy.  

## 2019-02-06 ENCOUNTER — Telehealth: Payer: Self-pay | Admitting: Family Medicine

## 2019-02-06 DIAGNOSIS — E119 Type 2 diabetes mellitus without complications: Secondary | ICD-10-CM

## 2019-02-06 MED ORDER — NONFORMULARY OR COMPOUNDED ITEM: 5 refills | Status: DC

## 2019-02-06 NOTE — Telephone Encounter (Signed)
Last refill 09/16/18 #30/5 Last office visit 04/27/18

## 2019-02-06 NOTE — Telephone Encounter (Signed)
I don't see where the request ever came through or that we denied it.  rx printed, please fax over after I sign it.   And he is overdue for A1c prior to web visit re: DM2.  I put in the order for that. Please schedule lab/visit.  Thanks.

## 2019-02-06 NOTE — Telephone Encounter (Signed)
[facility] [place] told him, his rx for Ruben Im was denied and he is checking on this   Best number [phone]

## 2019-02-08 MED ORDER — NONFORMULARY OR COMPOUNDED ITEM: 1 refills | Status: DC

## 2019-02-08 NOTE — Telephone Encounter (Signed)
Spoke with patient and Dr. Damita Dunnings.   Rx did not print but was able to call in to [facility] for 53ml, with 1 refill.  Patient is aware.  Also, was able to set him up for lab and DM2 virtual visit follow up next week.   Thanks.

## 2019-02-13 ENCOUNTER — Other Ambulatory Visit: Payer: Self-pay

## 2019-02-13 ENCOUNTER — Other Ambulatory Visit (INDEPENDENT_AMBULATORY_CARE_PROVIDER_SITE_OTHER): Payer: PPO

## 2019-02-13 DIAGNOSIS — E119 Type 2 diabetes mellitus without complications: Secondary | ICD-10-CM

## 2019-02-13 LAB — POCT GLYCOSYLATED HEMOGLOBIN (HGB A1C): Hemoglobin A1C: 8.2 % — AB (ref 4.0–5.6)

## 2019-02-15 ENCOUNTER — Encounter: Payer: Self-pay | Admitting: Family Medicine

## 2019-02-15 ENCOUNTER — Ambulatory Visit (INDEPENDENT_AMBULATORY_CARE_PROVIDER_SITE_OTHER): Payer: PPO | Admitting: Family Medicine

## 2019-02-15 DIAGNOSIS — E119 Type 2 diabetes mellitus without complications: Secondary | ICD-10-CM

## 2019-02-15 MED ORDER — PIOGLITAZONE HCL 15 MG PO TABS: 15.0000 mg | ORAL_TABLET | Freq: Every day | ORAL | 3 refills | Status: DC

## 2019-02-15 MED ORDER — ENALAPRIL MALEATE 10 MG PO TABS: 10.0000 mg | ORAL_TABLET | Freq: Every day | ORAL | 3 refills | Status: DC

## 2019-02-15 MED ORDER — METFORMIN HCL 1000 MG PO TABS: 1000.0000 mg | ORAL_TABLET | Freq: Every day | ORAL | 3 refills | Status: DC

## 2019-02-15 MED ORDER — LOVASTATIN 20 MG PO TABS: 20.0000 mg | ORAL_TABLET | Freq: Every day | ORAL | 3 refills | Status: DC

## 2019-02-15 NOTE — Progress Notes (Signed)
Virtual visit completed through WebEx or similar program Patient location: home  Provider location: [place] at [facility], office   Limitations and rationale for visit method d/w patient.  Patient agreed to proceed.   CC:DM  HPI.  Diabetes:  Using medications without difficulties: yes Hypoglycemic episodes:no Hyperglycemic episodes:no Feet problems:no Blood Sugars averaging: usually ~160s eye exam within last year: due, d/w pt.   Labs d/w pt.  A1c up from prior.  His schedule/diet/exercise were all affected by covid shutdown.   He wanted to work on diet and exercise and not change meds at th this point.   We talked about actos.  At this point, there is no clear evidence to direct a change in meds (re: actos).  Med is tolerated (no h/o bladder CA/CHF and no h/o blood in urine) and benefit from control of DM2 outweighs other considerations.  Pt agrees to continue actos.  He has no h/o blood in urine or fluid retention.    Meds and allergies reviewed.   ROS: Per HPI unless specifically indicated in ROS section   NAD Speech wnl  A/P:  DM2.   No change in meds.  He'll work more on diet and exercise and recheck in about 3 months at scheduled f/u.

## 2019-02-18 NOTE — Assessment & Plan Note (Signed)
No change in meds.  He'll work more on diet and exercise and recheck in about 3 months at scheduled f/u.

## 2019-05-13 ENCOUNTER — Other Ambulatory Visit (INDEPENDENT_AMBULATORY_CARE_PROVIDER_SITE_OTHER): Payer: PPO

## 2019-05-13 ENCOUNTER — Other Ambulatory Visit: Payer: Self-pay | Admitting: Family Medicine

## 2019-05-13 ENCOUNTER — Other Ambulatory Visit: Payer: Self-pay

## 2019-05-13 DIAGNOSIS — E291 Testicular hypofunction: Secondary | ICD-10-CM

## 2019-05-13 DIAGNOSIS — E119 Type 2 diabetes mellitus without complications: Secondary | ICD-10-CM

## 2019-05-13 LAB — TESTOSTERONE: Testosterone: 873.16 ng/dL (ref 300.00–890.00)

## 2019-05-13 LAB — CBC WITH DIFFERENTIAL/PLATELET
Basophils Absolute: 0 10*3/uL (ref 0.0–0.1)
Basophils Relative: 0.8 % (ref 0.0–3.0)
Eosinophils Absolute: 0.1 10*3/uL (ref 0.0–0.7)
Eosinophils Relative: 1.4 % (ref 0.0–5.0)
HCT: 43.2 % (ref 39.0–52.0)
Hemoglobin: 14.4 g/dL (ref 13.0–17.0)
Lymphocytes Relative: 22.6 % (ref 12.0–46.0)
Lymphs Abs: 1.3 10*3/uL (ref 0.7–4.0)
MCHC: 33.3 g/dL (ref 30.0–36.0)
MCV: 90.7 fl (ref 78.0–100.0)
Monocytes Absolute: 0.6 10*3/uL (ref 0.1–1.0)
Monocytes Relative: 9.7 % (ref 3.0–12.0)
Neutro Abs: 3.9 10*3/uL (ref 1.4–7.7)
Neutrophils Relative %: 65.5 % (ref 43.0–77.0)
Platelets: 219 10*3/uL (ref 150.0–400.0)
RBC: 4.76 Mil/uL (ref 4.22–5.81)
RDW: 14.1 % (ref 11.5–15.5)
WBC: 5.9 10*3/uL (ref 4.0–10.5)

## 2019-05-13 LAB — LIPID PANEL
Cholesterol: 140 mg/dL (ref 0–200)
HDL: 50.4 mg/dL (ref >39.00)
LDL Cholesterol: 67 mg/dL (ref 0–99)
NonHDL: 89.58
Total CHOL/HDL Ratio: 3
Triglycerides: 115 mg/dL (ref 0.0–149.0)
VLDL: 23 mg/dL (ref 0.0–40.0)

## 2019-05-13 LAB — COMPREHENSIVE METABOLIC PANEL
ALT: 22 U/L (ref 0–53)
AST: 19 U/L (ref 0–37)
Albumin: 4.1 g/dL (ref 3.5–5.2)
Alkaline Phosphatase: 67 U/L (ref 39–117)
BUN: 28 mg/dL — ABNORMAL HIGH (ref 6–23)
CO2: 30 mEq/L (ref 19–32)
Calcium: 9.2 mg/dL (ref 8.4–10.5)
Chloride: 102 mEq/L (ref 96–112)
Creatinine, Ser: 1.36 mg/dL (ref 0.40–1.50)
GFR: 51.41 mL/min — ABNORMAL LOW (ref >60.00)
Glucose, Bld: 251 mg/dL — ABNORMAL HIGH (ref 70–99)
Potassium: 4.6 mEq/L (ref 3.5–5.1)
Sodium: 139 mEq/L (ref 135–145)
Total Bilirubin: 0.6 mg/dL (ref 0.2–1.2)
Total Protein: 6.4 g/dL (ref 6.0–8.3)

## 2019-05-13 LAB — HEMOGLOBIN A1C: Hgb A1c MFr Bld: 8.2 % — ABNORMAL HIGH (ref 4.6–6.5)

## 2019-05-13 LAB — PSA, MEDICARE: PSA: 3.54 ng/ml (ref 0.10–4.00)

## 2019-05-14 DIAGNOSIS — H35371 Puckering of macula, right eye: Secondary | ICD-10-CM | POA: Diagnosis not present

## 2019-05-14 DIAGNOSIS — H43811 Vitreous degeneration, right eye: Secondary | ICD-10-CM | POA: Diagnosis not present

## 2019-05-14 DIAGNOSIS — E119 Type 2 diabetes mellitus without complications: Secondary | ICD-10-CM | POA: Diagnosis not present

## 2019-05-14 LAB — HM DIABETES EYE EXAM

## 2019-05-20 ENCOUNTER — Other Ambulatory Visit: Payer: Self-pay

## 2019-05-20 ENCOUNTER — Ambulatory Visit (INDEPENDENT_AMBULATORY_CARE_PROVIDER_SITE_OTHER): Payer: PPO | Admitting: Family Medicine

## 2019-05-20 ENCOUNTER — Encounter: Payer: Self-pay | Admitting: Family Medicine

## 2019-05-20 ENCOUNTER — Ambulatory Visit: Payer: PPO

## 2019-05-20 VITALS — BP 130/70 | HR 94 | Temp 98.0°F | Ht 66.0 in | Wt 189.1 lb

## 2019-05-20 DIAGNOSIS — E119 Type 2 diabetes mellitus without complications: Secondary | ICD-10-CM

## 2019-05-20 DIAGNOSIS — I10 Essential (primary) hypertension: Secondary | ICD-10-CM

## 2019-05-20 DIAGNOSIS — M25519 Pain in unspecified shoulder: Secondary | ICD-10-CM

## 2019-05-20 DIAGNOSIS — Z7189 Other specified counseling: Secondary | ICD-10-CM

## 2019-05-20 DIAGNOSIS — Z0000 Encounter for general adult medical examination without abnormal findings: Secondary | ICD-10-CM

## 2019-05-20 DIAGNOSIS — E291 Testicular hypofunction: Secondary | ICD-10-CM

## 2019-05-20 DIAGNOSIS — E7800 Pure hypercholesterolemia, unspecified: Secondary | ICD-10-CM

## 2019-05-20 DIAGNOSIS — M7710 Lateral epicondylitis, unspecified elbow: Secondary | ICD-10-CM

## 2019-05-20 MED ORDER — ENALAPRIL MALEATE 10 MG PO TABS: 5.0000 mg | ORAL_TABLET | Freq: Every day | ORAL | Status: DC

## 2019-05-20 NOTE — Progress Notes (Signed)
I have personally reviewed the Medicare Annual Wellness questionnaire and have noted 1. The patient's medical and social history 2. Their use of alcohol, tobacco or illicit drugs 3. Their current medications and supplements 4. The patient's functional ability including ADL's, fall risks, home safety risks and hearing or visual             impairment. 5. Diet and physical activities 6. Evidence for depression or mood disorders  The patients weight, height, BMI have been recorded in the chart and visual acuity is per eye clinic.  I have made referrals, counseling and provided education to the patient based review of the above and I have provided the pt with a written personalized care plan for preventive services.  Provider list updated- see scanned forms.  Routine anticipatory guidance given to patient.  See health maintenance. The possibility exists that previously documented standard health maintenance information may have been brought forward from a previous encounter into this note.  If needed, that same information has been updated to reflect the current situation based on today's encounter.    Flu to be done at pharmacy. Shingles done at pharmacy PNA up-to-date Tetanus 2013 Colonoscopy 2015 Prostate cancer screening 2020 Advance directive wife designated if patient were incapacitated. Cognitive function addressed- see scanned forms- and if abnormal then additional documentation follows.   L elbow pain.  Sore when he bumps it, more than he expectED it to be.  No specific trauma.  Some pain with resisted range of motion.  R upper arm pain, near the lateral midshaft humerus.  Pain with overhead movement.  Pain sleeping on R side at night.  Pain reaching for objects.    T replacement.  PSA wnl.   CBC okay.   Labs d/w pt.  Cr at baseline of 1.3-1.5. D/w pt about risk of use vs benefit.  His energy level was better on med but less than prev.  Could continue as is for now vs taper down.  He  agrees.    Diabetes:  Using medications without difficulties: yes Hypoglycemic episodes:no Hyperglycemic episodes:no Feet problems: no Blood Sugars averaging: usually ~160s eye exam within last year: yes, 04/2019.   A1c still up at 8.2, d/w pt.   Hypertension:    Using medication without problems or lightheadedness: occ lightheaded with position change.  Noted in the lats ~6 months.   Chest pain with exertion:no Edema:no Short of breath: no changes, other than as expected given his age.    Elevated Cholesterol: Using medications without problems: yes Muscle aches: no Diet compliance:  Encouraged.  Exercise: Encouraged.   Labs d/w pt.    PMH and SH reviewed  Meds, vitals, and allergies reviewed.   ROS: Per HPI.  Unless specifically indicated otherwise in HPI, the patient denies:  General: fever. Eyes: acute vision changes ENT: sore throat Cardiovascular: chest pain Respiratory: SOB GI: vomiting GU: dysuria Musculoskeletal: acute back pain Derm: acute rash Neuro: acute motor dysfunction Psych: worsening mood Endocrine: polydipsia Heme: bleeding Allergy: hayfever  GEN: nad, alert and oriented HEENT: mucous membranes moist NECK: supple w/o LA CV: rrr. PULM: ctab, no inc wob ABD: soft, +bs EXT: no edema SKIN: no acute rash Left elbow with tenderness near the lateral epicondyles but not at the olecranon or medial epicondyle.  Testing is typical for tennis elbow with some improvement with compression of the forearm during resisted pronation and supination. Right shoulder with pain on internal rotation.  No arm drop.  Less pain with external rotation.  Distally neurovascular intact.  Diabetic foot exam: Normal inspection No skin breakdown No calluses  Normal DP pulses Normal sensation to light touch and monofilament Nails normal  Health Maintenance  Topic Date Due  . OPHTHALMOLOGY EXAM  05/17/2018  . INFLUENZA VACCINE  04/27/2019  . FOOT EXAM  04/28/2019  .  HEMOGLOBIN A1C  11/13/2019  . TETANUS/TDAP  01/26/2022  . COLONOSCOPY  01/10/2024  . Hepatitis C Screening  Completed  . PNA vac Low Risk Adult  Completed

## 2019-05-20 NOTE — Patient Instructions (Addendum)
You can try changing the testosterone to every other day for 2 weeks.   Then twice a week for 2 weeks, then stop.  See how you feel and let me know.   Start using the shoulder exercises.  Ice your elbow and get a tennis elbow strap.   Keep working on diet and exercise and recheck A1c at a visit in about 3-4 months.  We can do labs at the visit. You don't have to fast.   Cut the enalapril in half and see if the lightheaded sensation is better.   Take care.  Glad to see you.

## 2019-05-23 DIAGNOSIS — M25519 Pain in unspecified shoulder: Secondary | ICD-10-CM | POA: Insufficient documentation

## 2019-05-23 DIAGNOSIS — M7710 Lateral epicondylitis, unspecified elbow: Secondary | ICD-10-CM | POA: Insufficient documentation

## 2019-05-23 NOTE — Assessment & Plan Note (Signed)
Likely rotator cuff irritation and he can use home exercise program to see if that helps.  Handout given to patient.  Exercises explained.  Rationale discussed.  He will update me as needed.

## 2019-05-23 NOTE — Assessment & Plan Note (Signed)
T replacement.  PSA wnl.   CBC okay.   Labs d/w pt.  Cr at baseline of 1.3-1.5. D/w pt about risk of use vs benefit.  His energy level was better on med but less than prev.  Could continue as is for now vs taper down.  He agrees.    He will try to taper down slowly.  See after visit summary.

## 2019-05-23 NOTE — Assessment & Plan Note (Signed)
A1c still up at 8.2, d/w pt. recheck in a few months.  See after visit summary.  Keep working on diet and exercise in the meantime.

## 2019-05-23 NOTE — Assessment & Plan Note (Signed)
Advance directive- wife designated if patient were incapacitated.  

## 2019-05-23 NOTE — Assessment & Plan Note (Signed)
Continue work on diet and exercise.  No change in statin.  He agrees.

## 2019-05-23 NOTE — Assessment & Plan Note (Signed)
Flu to be done at pharmacy. Shingles done at pharmacy PNA up-to-date Tetanus 2013 Colonoscopy 2015 Prostate cancer screening 2020 Advance directive wife designated if patient were incapacitated. Cognitive function addressed- see scanned forms- and if abnormal then additional documentation follows.

## 2019-05-23 NOTE — Assessment & Plan Note (Signed)
Anatomy discussed with patient.  He can use ice and a tennis elbow strap as needed.  Update me as needed.

## 2019-05-23 NOTE — Assessment & Plan Note (Signed)
Decrease enalapril in the meantime and see if lightheadedness gets better.  He agrees.

## 2019-06-28 ENCOUNTER — Encounter: Payer: Self-pay | Admitting: Family Medicine

## 2019-07-18 ENCOUNTER — Other Ambulatory Visit: Payer: Self-pay | Admitting: *Deleted

## 2019-07-19 NOTE — Telephone Encounter (Signed)
Patient was going to try tapering down on this medication.  Did he do that?  How does he feel?  Please get update on patient.  Thanks.

## 2019-07-19 NOTE — Telephone Encounter (Signed)
Patient states he has tapered down a bit but hasn't felt very well but he is going to continue to taper.  However, he does need the refill at this time.

## 2019-07-21 MED ORDER — NONFORMULARY OR COMPOUNDED ITEM: 1 refills | Status: DC

## 2019-07-21 NOTE — Telephone Encounter (Signed)
Noted. Thanks.  rx faxed via EMR.  I am not currently at clinic- if this printed I can sign and then please fax.  Thanks.

## 2019-08-13 ENCOUNTER — Other Ambulatory Visit: Payer: Self-pay | Admitting: Family Medicine

## 2019-08-26 ENCOUNTER — Other Ambulatory Visit: Payer: Self-pay

## 2019-08-26 ENCOUNTER — Ambulatory Visit (INDEPENDENT_AMBULATORY_CARE_PROVIDER_SITE_OTHER): Payer: PPO | Admitting: Family Medicine

## 2019-08-26 ENCOUNTER — Encounter: Payer: Self-pay | Admitting: Family Medicine

## 2019-08-26 VITALS — BP 128/70 | HR 88 | Temp 97.2°F | Ht 66.0 in | Wt 191.4 lb

## 2019-08-26 DIAGNOSIS — E119 Type 2 diabetes mellitus without complications: Secondary | ICD-10-CM

## 2019-08-26 DIAGNOSIS — G4700 Insomnia, unspecified: Secondary | ICD-10-CM | POA: Diagnosis not present

## 2019-08-26 LAB — POCT GLYCOSYLATED HEMOGLOBIN (HGB A1C): Hemoglobin A1C: 8.6 % — AB (ref 4.0–5.6)

## 2019-08-26 MED ORDER — METFORMIN HCL 1000 MG PO TABS: 1000.0000 mg | ORAL_TABLET | Freq: Two times a day (BID) | ORAL | 3 refills | Status: DC

## 2019-08-26 MED ORDER — METFORMIN HCL 1000 MG PO TABS: 1000.0000 mg | ORAL_TABLET | Freq: Two times a day (BID) | ORAL | Status: DC

## 2019-08-26 NOTE — Assessment & Plan Note (Signed)
A1c up, d/w pt about diet and exercise.  Inc metformin.  Take 1 metformin in the AM and 1/2 in the PM. If needed, go up to 1 pill twice a day.   If not tolerated, then cut back.  Cautions d/w pt.   Recheck in 3-4 months.  He agrees .

## 2019-08-26 NOTE — Progress Notes (Signed)
This visit occurred during the SARS-CoV-2 public health emergency.  Safety protocols were in place, including screening questions prior to the visit, additional usage of staff PPE, and extensive cleaning of exam room while observing appropriate contact time as indicated for disinfecting solutions.   Diabetes:  Using medications without difficulties: yes Hypoglycemic episodes:no Hyperglycemic episodes:no Feet problems: no Blood Sugars averaging: usually ~180 or higher in the AM, he was expecting his A1c to be up eye exam within last year: yes A1c done at OV.  A1c still>8, d/w pt.  Diet and exercise d/w pt.    Insomnia.  Goes to sleep well but waking at 2 AM consistently.  Sometimes from nocturia but not always. 0-3 nocturia.  Benadryl didn't help.    Meds, vitals, and allergies reviewed.  ROS: Per HPI unless specifically indicated in ROS section   GEN: nad, alert and oriented HEENT: ncat NECK: supple w/o LA CV: rrr. PULM: ctab, no inc wob ABD: soft, +bs EXT: no edema SKIN: well perfused .

## 2019-08-26 NOTE — Assessment & Plan Note (Signed)
Can try 3-5mg  OTC melatonin and update me as needed.

## 2019-08-26 NOTE — Patient Instructions (Addendum)
Thanks for getting a flu shot.  Take 1 metformin in the AM and 1/2 in the PM.  See if you tolerate that.   If needed, go up to 1 pill twice a day.   If not tolerated, then cut back.  Max 2 pills in a day.    Try taking 3-5mg  melatonin at night and see if that helps.  Update me as needed.  Recheck in about 3-4 months with A1c at the visit.  You don't have to fast.

## 2019-11-08 ENCOUNTER — Ambulatory Visit: Payer: PPO

## 2020-01-21 ENCOUNTER — Other Ambulatory Visit: Payer: Self-pay | Admitting: *Deleted

## 2020-01-21 MED ORDER — NONFORMULARY OR COMPOUNDED ITEM: 1 refills | Status: DC

## 2020-01-21 NOTE — Telephone Encounter (Signed)
Electronic refill request. Testosterone 5% Cream Last office visit:   08/26/2019 Last Filled:   07/21/2019 This mistakenly printed out so it looks like it was refilled today but it was not.  Please advise.

## 2020-01-23 MED ORDER — NONFORMULARY OR COMPOUNDED ITEM: 1 refills | Status: DC

## 2020-01-23 NOTE — Telephone Encounter (Signed)
Patient advised. Appointment scheduled.  

## 2020-01-23 NOTE — Telephone Encounter (Signed)
I faxed the rx.  That should cover it.  Have patient check with pharmacy and update [place] if the fax will not suffice.  And he is overdue for DM2 f/u.  Please schedule. Thanks.

## 2020-01-30 ENCOUNTER — Ambulatory Visit: Payer: PPO | Admitting: Family Medicine

## 2020-02-06 ENCOUNTER — Encounter: Payer: Self-pay | Admitting: Family Medicine

## 2020-02-06 ENCOUNTER — Other Ambulatory Visit: Payer: Self-pay

## 2020-02-06 ENCOUNTER — Ambulatory Visit (INDEPENDENT_AMBULATORY_CARE_PROVIDER_SITE_OTHER): Payer: PPO | Admitting: Family Medicine

## 2020-02-06 VITALS — BP 136/74 | HR 93 | Temp 97.2°F | Ht 66.0 in | Wt 196.4 lb

## 2020-02-06 DIAGNOSIS — M5430 Sciatica, unspecified side: Secondary | ICD-10-CM

## 2020-02-06 DIAGNOSIS — E119 Type 2 diabetes mellitus without complications: Secondary | ICD-10-CM | POA: Diagnosis not present

## 2020-02-06 LAB — POCT GLYCOSYLATED HEMOGLOBIN (HGB A1C): Hemoglobin A1C: 7.8 % — AB (ref 4.0–5.6)

## 2020-02-06 NOTE — Progress Notes (Signed)
This visit occurred during the SARS-CoV-2 public health emergency.  Safety protocols were in place, including screening questions prior to the visit, additional usage of staff PPE, and extensive cleaning of exam room while observing appropriate contact time as indicated for disinfecting solutions.  Diabetes:  Using medications without difficulties: on metformin and actos.   Hypoglycemic episodes: no Hyperglycemic episodes: no Feet problems: no Blood Sugars averaging: 142 this AM, that close to his typical AM level.   eye exam within last year: yes A1c 7.8.  Discussed with patient at office visit.  He had his covid vaccine.   He has some episodic L sided sciatica that is worse with prolonged sitting, better when up and working, better with advil.    He tried to cut back on testosterone but his fatigue got a lot worse and "my attitude was worse."    Meds, vitals, and allergies reviewed.   ROS: Per HPI unless specifically indicated in ROS section   GEN: nad, alert and oriented HEENT: ncat NECK: supple w/o LA CV: rrr. PULM: ctab, no inc wob ABD: soft, +bs EXT: no edema SKIN: no acute rash Bilateral straight leg raise negative.  Able to bear weight.

## 2020-02-06 NOTE — Patient Instructions (Addendum)
Don't change your meds.  Keep working on diet and exercise.  Recheck in about 6 months at a physical.  Labs ahead of time.  Use the back exercises.  Take care.  Glad to see you.

## 2020-02-11 DIAGNOSIS — M5430 Sciatica, unspecified side: Secondary | ICD-10-CM | POA: Insufficient documentation

## 2020-02-11 NOTE — Assessment & Plan Note (Signed)
Normal hip range of motion and strength and sensation intact now.  No symptoms currently.  Back exercise handout given to patient, demonstrated and explained.  He can work on home exercise program stretching and update me as needed.  He agrees.

## 2020-02-11 NOTE — Assessment & Plan Note (Signed)
No change in meds.  Keep working on diet and exercise.  Recheck in about 6 months at a physical.  Labs ahead of time.  He agrees.

## 2020-02-25 ENCOUNTER — Other Ambulatory Visit: Payer: Self-pay | Admitting: Family Medicine

## 2020-03-09 ENCOUNTER — Other Ambulatory Visit: Payer: Self-pay | Admitting: Family Medicine

## 2020-03-25 DIAGNOSIS — E119 Type 2 diabetes mellitus without complications: Secondary | ICD-10-CM | POA: Diagnosis not present

## 2020-03-25 DIAGNOSIS — H43811 Vitreous degeneration, right eye: Secondary | ICD-10-CM | POA: Diagnosis not present

## 2020-03-25 DIAGNOSIS — H35371 Puckering of macula, right eye: Secondary | ICD-10-CM | POA: Diagnosis not present

## 2020-03-25 DIAGNOSIS — H2513 Age-related nuclear cataract, bilateral: Secondary | ICD-10-CM | POA: Diagnosis not present

## 2020-03-25 LAB — HM DIABETES EYE EXAM

## 2020-04-09 ENCOUNTER — Encounter: Payer: Self-pay | Admitting: Family Medicine

## 2020-05-27 ENCOUNTER — Other Ambulatory Visit: Payer: Self-pay | Admitting: Family Medicine

## 2020-05-27 NOTE — Telephone Encounter (Signed)
See last office visit note in May. . Please call patient and schedule him for a physical in November.

## 2020-07-16 DIAGNOSIS — B9689 Other specified bacterial agents as the cause of diseases classified elsewhere: Secondary | ICD-10-CM | POA: Diagnosis not present

## 2020-07-16 DIAGNOSIS — L0202 Furuncle of face: Secondary | ICD-10-CM | POA: Diagnosis not present

## 2020-07-16 DIAGNOSIS — L821 Other seborrheic keratosis: Secondary | ICD-10-CM | POA: Diagnosis not present

## 2020-07-16 DIAGNOSIS — D225 Melanocytic nevi of trunk: Secondary | ICD-10-CM | POA: Diagnosis not present

## 2020-07-27 ENCOUNTER — Telehealth: Payer: Self-pay | Admitting: Family Medicine

## 2020-07-28 ENCOUNTER — Other Ambulatory Visit: Payer: Self-pay | Admitting: Family Medicine

## 2020-07-28 NOTE — Telephone Encounter (Signed)
Last prescribed on 01/23/2020. Last OV on 02/06/2020. Next appointment on 08/24/2020

## 2020-07-28 NOTE — Telephone Encounter (Signed)
Pt called stating pharmacy has been trying to get  Testerone refilled  [facility]

## 2020-07-29 MED ORDER — NONFORMULARY OR COMPOUNDED ITEM: 1 refills | Status: DC

## 2020-07-29 NOTE — Telephone Encounter (Signed)
Please make sure this prescription printed.  I can sign it when I get back to clinic.  Please fax in.  Thanks.

## 2020-07-29 NOTE — Telephone Encounter (Signed)
On your desk for signature

## 2020-08-04 ENCOUNTER — Telehealth: Payer: Self-pay

## 2020-08-04 NOTE — Telephone Encounter (Signed)
RX Testosterone 5% cream mailed to patient's home address.  Dm/cma

## 2020-08-06 ENCOUNTER — Telehealth: Payer: Self-pay | Admitting: Family Medicine

## 2020-08-06 NOTE — Progress Notes (Signed)
  Chronic Care Management   Outreach Note  08/06/2020 Name: Theodore Estes MRN: [id number] DOB: 06/21/1946  Referred by: Tonia Ghent, MD Reason for referral : Chronic Care Management   An unsuccessful telephone outreach was attempted today. The patient was referred to the pharmacist for assistance with care management and care coordination.   Follow Up Plan:   Hilario Quarry  Upstream Scheduler

## 2020-08-07 ENCOUNTER — Other Ambulatory Visit: Payer: Self-pay | Admitting: Family Medicine

## 2020-08-07 NOTE — Telephone Encounter (Signed)
Pharmacy requests refill on: Lovastatin 20 mg  LAST REFILL: 06/06/2020 LAST OV: 02/06/2020 NEXT OV: 08/24/2020 PHARMACY: [facility]

## 2020-08-09 ENCOUNTER — Other Ambulatory Visit: Payer: Self-pay | Admitting: Family Medicine

## 2020-08-09 DIAGNOSIS — Z125 Encounter for screening for malignant neoplasm of prostate: Secondary | ICD-10-CM

## 2020-08-09 DIAGNOSIS — E291 Testicular hypofunction: Secondary | ICD-10-CM

## 2020-08-09 DIAGNOSIS — E119 Type 2 diabetes mellitus without complications: Secondary | ICD-10-CM

## 2020-08-17 ENCOUNTER — Other Ambulatory Visit (INDEPENDENT_AMBULATORY_CARE_PROVIDER_SITE_OTHER): Payer: PPO

## 2020-08-17 ENCOUNTER — Other Ambulatory Visit: Payer: Self-pay

## 2020-08-17 DIAGNOSIS — Z125 Encounter for screening for malignant neoplasm of prostate: Secondary | ICD-10-CM | POA: Diagnosis not present

## 2020-08-17 DIAGNOSIS — E291 Testicular hypofunction: Secondary | ICD-10-CM | POA: Diagnosis not present

## 2020-08-17 DIAGNOSIS — E119 Type 2 diabetes mellitus without complications: Secondary | ICD-10-CM

## 2020-08-17 LAB — CBC WITH DIFFERENTIAL/PLATELET
Basophils Absolute: 0.1 10*3/uL (ref 0.0–0.1)
Basophils Relative: 0.9 % (ref 0.0–3.0)
Eosinophils Absolute: 0.2 10*3/uL (ref 0.0–0.7)
Eosinophils Relative: 2.9 % (ref 0.0–5.0)
HCT: 45.4 % (ref 39.0–52.0)
Hemoglobin: 15.2 g/dL (ref 13.0–17.0)
Lymphocytes Relative: 27.4 % (ref 12.0–46.0)
Lymphs Abs: 1.8 10*3/uL (ref 0.7–4.0)
MCHC: 33.4 g/dL (ref 30.0–36.0)
MCV: 87.2 fl (ref 78.0–100.0)
Monocytes Absolute: 0.6 10*3/uL (ref 0.1–1.0)
Monocytes Relative: 9.6 % (ref 3.0–12.0)
Neutro Abs: 3.9 10*3/uL (ref 1.4–7.7)
Neutrophils Relative %: 59.2 % (ref 43.0–77.0)
Platelets: 253 10*3/uL (ref 150.0–400.0)
RBC: 5.2 Mil/uL (ref 4.22–5.81)
RDW: 13.4 % (ref 11.5–15.5)
WBC: 6.6 10*3/uL (ref 4.0–10.5)

## 2020-08-17 LAB — COMPREHENSIVE METABOLIC PANEL
ALT: 22 U/L (ref 0–53)
AST: 21 U/L (ref 0–37)
Albumin: 4.4 g/dL (ref 3.5–5.2)
Alkaline Phosphatase: 78 U/L (ref 39–117)
BUN: 31 mg/dL — ABNORMAL HIGH (ref 6–23)
CO2: 29 mEq/L (ref 19–32)
Calcium: 9.8 mg/dL (ref 8.4–10.5)
Chloride: 103 mEq/L (ref 96–112)
Creatinine, Ser: 1.61 mg/dL — ABNORMAL HIGH (ref 0.40–1.50)
GFR: 42.03 mL/min — ABNORMAL LOW (ref >60.00)
Glucose, Bld: 142 mg/dL — ABNORMAL HIGH (ref 70–99)
Potassium: 5.1 mEq/L (ref 3.5–5.1)
Sodium: 141 mEq/L (ref 135–145)
Total Bilirubin: 0.6 mg/dL (ref 0.2–1.2)
Total Protein: 6.9 g/dL (ref 6.0–8.3)

## 2020-08-17 LAB — PSA, MEDICARE: PSA: 4.06 ng/ml — ABNORMAL HIGH (ref 0.10–4.00)

## 2020-08-17 LAB — LIPID PANEL
Cholesterol: 142 mg/dL (ref 0–200)
HDL: 54.2 mg/dL (ref >39.00)
LDL Cholesterol: 68 mg/dL (ref 0–99)
NonHDL: 88.06
Total CHOL/HDL Ratio: 3
Triglycerides: 98 mg/dL (ref 0.0–149.0)
VLDL: 19.6 mg/dL (ref 0.0–40.0)

## 2020-08-17 LAB — HEMOGLOBIN A1C: Hgb A1c MFr Bld: 8.1 % — ABNORMAL HIGH (ref 4.6–6.5)

## 2020-08-17 LAB — TESTOSTERONE: Testosterone: 510.01 ng/dL (ref 300.00–890.00)

## 2020-08-17 NOTE — Telephone Encounter (Signed)
Fax came back as failed. Contacted pharmacy and spoke with Ander Purpura, pharmacist. She accepted a verbal order. She read back order. Advised pt still has the hardcopy. She said she would advise pt to bring hardcopy when he picks up script. Advised if anything is needed to contact office. Lauren verbalized understanding.

## 2020-08-17 NOTE — Telephone Encounter (Signed)
Pt in office today for lab apt and brought testosterone script with him. Pt reports he lives [place] miles from [place] and wants the script electronically sent to pharmacy so he does not have to drive there twice. . Talked to PCP and he said it will not go electronically because it is compounded. He said it could be faxed. Faxed script to [phone]. Gave script back to pt incase any problem with pharmacy. Pt appreciative but would like script faxed from now on.

## 2020-08-21 ENCOUNTER — Other Ambulatory Visit: Payer: Self-pay | Admitting: Family Medicine

## 2020-08-24 ENCOUNTER — Encounter: Payer: Self-pay | Admitting: Family Medicine

## 2020-08-24 ENCOUNTER — Ambulatory Visit (INDEPENDENT_AMBULATORY_CARE_PROVIDER_SITE_OTHER): Payer: PPO | Admitting: Family Medicine

## 2020-08-24 ENCOUNTER — Other Ambulatory Visit: Payer: Self-pay

## 2020-08-24 VITALS — BP 134/70 | HR 99 | Temp 98.5°F | Ht 66.0 in | Wt 192.2 lb

## 2020-08-24 DIAGNOSIS — Z7189 Other specified counseling: Secondary | ICD-10-CM

## 2020-08-24 DIAGNOSIS — I10 Essential (primary) hypertension: Secondary | ICD-10-CM

## 2020-08-24 DIAGNOSIS — E291 Testicular hypofunction: Secondary | ICD-10-CM

## 2020-08-24 DIAGNOSIS — R7989 Other specified abnormal findings of blood chemistry: Secondary | ICD-10-CM

## 2020-08-24 DIAGNOSIS — E7800 Pure hypercholesterolemia, unspecified: Secondary | ICD-10-CM | POA: Diagnosis not present

## 2020-08-24 DIAGNOSIS — E119 Type 2 diabetes mellitus without complications: Secondary | ICD-10-CM

## 2020-08-24 DIAGNOSIS — Z125 Encounter for screening for malignant neoplasm of prostate: Secondary | ICD-10-CM

## 2020-08-24 DIAGNOSIS — Z0000 Encounter for general adult medical examination without abnormal findings: Secondary | ICD-10-CM

## 2020-08-24 LAB — BASIC METABOLIC PANEL
BUN: 33 mg/dL — ABNORMAL HIGH (ref 6–23)
CO2: 29 mEq/L (ref 19–32)
Calcium: 9.2 mg/dL (ref 8.4–10.5)
Chloride: 105 mEq/L (ref 96–112)
Creatinine, Ser: 1.57 mg/dL — ABNORMAL HIGH (ref 0.40–1.50)
GFR: 43.31 mL/min — ABNORMAL LOW (ref >60.00)
Glucose, Bld: 126 mg/dL — ABNORMAL HIGH (ref 70–99)
Potassium: 4.5 mEq/L (ref 3.5–5.1)
Sodium: 142 mEq/L (ref 135–145)

## 2020-08-24 MED ORDER — ENALAPRIL MALEATE 10 MG PO TABS
10.0000 mg | ORAL_TABLET | Freq: Every day | ORAL | 3 refills | Status: DC
Start: 2020-08-24 — End: 2020-11-19

## 2020-08-24 MED ORDER — PIOGLITAZONE HCL 15 MG PO TABS
15.0000 mg | ORAL_TABLET | Freq: Every day | ORAL | 3 refills | Status: DC
Start: 2020-08-24 — End: 2021-08-31

## 2020-08-24 MED ORDER — LOVASTATIN 20 MG PO TABS
20.0000 mg | ORAL_TABLET | Freq: Every day | ORAL | 3 refills | Status: DC
Start: 2020-08-24 — End: 2021-08-31

## 2020-08-24 MED ORDER — METFORMIN HCL 1000 MG PO TABS
1000.0000 mg | ORAL_TABLET | Freq: Two times a day (BID) | ORAL | 3 refills | Status: DC
Start: 2020-08-24 — End: 2021-10-29

## 2020-08-24 NOTE — Patient Instructions (Signed)
Stop testosterone use.  Go to the lab on the way out.   If you have mychart we'll likely use that to update you.     Plan on recheck labs in about 3 months.  Take care.  Glad to see you.

## 2020-08-24 NOTE — Progress Notes (Signed)
This visit occurred during the SARS-CoV-2 public health emergency.  Safety protocols were in place, including screening questions prior to the visit, additional usage of staff PPE, and extensive cleaning of exam room while observing appropriate contact time as indicated for disinfecting solutions.  Diabetes:  Using medications without difficulties: yes Hypoglycemic episodes:no Hyperglycemic episodes:no Feet problems:no Blood Sugars averaging:~150 when on diet, worse off diet.   eye exam within last year: yes He has been off his diet, A1c up, d/w pt.    Hypertension:    Using medication without problems or lightheadedness: yes Chest pain with exertion:no Edema:no Short of breath:no  Cr elevated compared to previous.  No new meds.  Still on ACE inhibitor at baseline.  Follow-up labs pending.  Elevated Cholesterol: Using medications without problems: yes Muscle aches: likely not from statin.  He has some lower back pain in the AMs, d/w pt about restarting HEP.   Diet compliance: encouraged.   Exercise: encouraged  Flu prev done at pharmacy Shingles 2020 PNA up-to-date covid 2021 Tetanus 2013 Colonoscopy 2015 Prostate cancer screening 2021 Advance directive wife designated if patient were incapacitated.  Currently on testosterone replacement.  PSA elevated compared to previous.  Discussed with patient.  See plan below.  He does note that his stream is slower and he has some occasional urinary urgency.  PMH and SH reviewed.   Vital signs, Meds and allergies reviewed.  ROS: Per HPI unless specifically indicated in ROS section   GEN: nad, alert and oriented HEENT: NCAT NECK: supple w/o LA CV: rrr.  PULM: ctab, no inc wob ABD: soft, +bs EXT: no edema SKIN: no acute rash Prostate gland firm and smooth, no enlargement, nodularity, tenderness, mass, asymmetry or induration.  Diabetic foot exam: Normal inspection No skin breakdown No calluses  Normal DP pulses Normal  sensation to light tough and monofilament Nails normal

## 2020-08-26 ENCOUNTER — Other Ambulatory Visit: Payer: Self-pay | Admitting: Family Medicine

## 2020-08-26 DIAGNOSIS — I10 Essential (primary) hypertension: Secondary | ICD-10-CM

## 2020-08-26 DIAGNOSIS — IMO0002 Reserved for concepts with insufficient information to code with codable children: Secondary | ICD-10-CM | POA: Insufficient documentation

## 2020-08-26 DIAGNOSIS — Z0000 Encounter for general adult medical examination without abnormal findings: Secondary | ICD-10-CM | POA: Insufficient documentation

## 2020-08-26 DIAGNOSIS — R7989 Other specified abnormal findings of blood chemistry: Secondary | ICD-10-CM | POA: Insufficient documentation

## 2020-08-26 NOTE — Assessment & Plan Note (Signed)
Continue lovastatin.  Discussed with patient about restarting home exercise program and working on diet.

## 2020-08-26 NOTE — Assessment & Plan Note (Signed)
Previous baseline around 1.3, now up to 1.5.  Recheck pending for today.  Still on ACE inhibitor.  See notes on labs.

## 2020-08-26 NOTE — Assessment & Plan Note (Addendum)
Continue Metformin.  Discussed with patient about diet and exercise.  His blood sugar had clearly been better when he is on his diet.  Continue Metformin and pioglitazone for now.  See after visit summary.

## 2020-08-26 NOTE — Assessment & Plan Note (Signed)
Flu prev done at pharmacy Shingles 2020 PNA up-to-date covid 2021 Tetanus 2013 Colonoscopy 2015 Prostate cancer screening 2021 Advance directive wife designated if patient were incapacitated.

## 2020-08-26 NOTE — Assessment & Plan Note (Signed)
Up until this point had been on testosterone replacement.  We talked about routine cautions from before.  His PSA usually range in the threes.  Is now slightly above 4.  I think it makes sense to stop testosterone replacement right now, at least temporarily.  We can recheck his PSA in a few months.  Discussed with patient.  He agrees.  Rectal exam is unremarkable.  He does have some lower urinary tract symptoms that could be attributable to BPH but I do not feel obvious enlargement.  We can see if the symptoms change in the meantime when he is not on testosterone replacement.

## 2020-08-26 NOTE — Assessment & Plan Note (Signed)
No change in medications at this point.  Would still continue enalapril.  See notes on follow-up labs.

## 2020-08-26 NOTE — Assessment & Plan Note (Signed)
Advance directive- wife designated if patient were incapacitated.  

## 2020-10-21 ENCOUNTER — Telehealth: Payer: Self-pay

## 2020-10-21 NOTE — Telephone Encounter (Signed)
Patient has 3 months follow up scheduled for 11/19/20 with Dr Damita Dunnings. Does patient need to come in ahead of time to get labs done?  Please call Mrs Yo at [phone]

## 2020-10-21 NOTE — Telephone Encounter (Signed)
Labs ahead of time would be really useful.  The orders are in the EMR.  Thanks.

## 2020-10-26 NOTE — Telephone Encounter (Signed)
Called and scheduled patient lab appt for 11/16/20 at 8:20am

## 2020-11-16 ENCOUNTER — Other Ambulatory Visit (INDEPENDENT_AMBULATORY_CARE_PROVIDER_SITE_OTHER): Payer: PPO

## 2020-11-16 ENCOUNTER — Other Ambulatory Visit: Payer: Self-pay

## 2020-11-16 DIAGNOSIS — I10 Essential (primary) hypertension: Secondary | ICD-10-CM

## 2020-11-16 DIAGNOSIS — Z125 Encounter for screening for malignant neoplasm of prostate: Secondary | ICD-10-CM

## 2020-11-16 DIAGNOSIS — E119 Type 2 diabetes mellitus without complications: Secondary | ICD-10-CM

## 2020-11-16 LAB — BASIC METABOLIC PANEL
BUN: 25 mg/dL — ABNORMAL HIGH (ref 6–23)
CO2: 29 mEq/L (ref 19–32)
Calcium: 9.5 mg/dL (ref 8.4–10.5)
Chloride: 104 mEq/L (ref 96–112)
Creatinine, Ser: 1.23 mg/dL (ref 0.40–1.50)
GFR: 57.96 mL/min — ABNORMAL LOW (ref >60.00)
Glucose, Bld: 141 mg/dL — ABNORMAL HIGH (ref 70–99)
Potassium: 4.8 mEq/L (ref 3.5–5.1)
Sodium: 142 mEq/L (ref 135–145)

## 2020-11-16 LAB — HEMOGLOBIN A1C: Hgb A1c MFr Bld: 8.2 % — ABNORMAL HIGH (ref 4.6–6.5)

## 2020-11-16 LAB — PSA, MEDICARE: PSA: 2.76 ng/ml (ref 0.10–4.00)

## 2020-11-19 ENCOUNTER — Encounter: Payer: Self-pay | Admitting: Family Medicine

## 2020-11-19 ENCOUNTER — Ambulatory Visit (INDEPENDENT_AMBULATORY_CARE_PROVIDER_SITE_OTHER): Payer: PPO | Admitting: Family Medicine

## 2020-11-19 ENCOUNTER — Other Ambulatory Visit: Payer: Self-pay

## 2020-11-19 DIAGNOSIS — R7989 Other specified abnormal findings of blood chemistry: Secondary | ICD-10-CM

## 2020-11-19 DIAGNOSIS — I10 Essential (primary) hypertension: Secondary | ICD-10-CM | POA: Diagnosis not present

## 2020-11-19 DIAGNOSIS — E119 Type 2 diabetes mellitus without complications: Secondary | ICD-10-CM

## 2020-11-19 NOTE — Progress Notes (Signed)
This visit occurred during the SARS-CoV-2 public health emergency.  Safety protocols were in place, including screening questions prior to the visit, additional usage of staff PPE, and extensive cleaning of exam room while observing appropriate contact time as indicated for disinfecting solutions.  He is off testosterone.  Lower libido but his PSA and Cr are better.  D/w pt.    Diabetes:  Using medications without difficulties: yes Hypoglycemic episodes: no Hyperglycemic episodes: no Feet problems: no Blood Sugars averaging: 185 this AM, higher off testosterone, dw pt.   eye exam within last year: yes "I can do better on the sugar" with diet and exercise changes.   A1c similar to prev ~8.    Lower BP noted, on 5mg  enalapril.  I asked him to stop enalapril and check BP at home.    Meds, vitals, and allergies reviewed.   ROS: Per HPI unless specifically indicated in ROS section   GEN: nad, alert and oriented HEENT: ncat NECK: supple w/o LA CV: rrr. PULM: ctab, no inc wob ABD: soft, +bs EXT: no edema SKIN: well perfused.

## 2020-11-19 NOTE — Patient Instructions (Addendum)
Stop enalapril and check BP at home.   Take care.  Glad to see you.  Keep working on diet and exercise.  Recheck at office visit in about 4 months with A1c at the visit.  You don't have to fast.

## 2020-11-22 NOTE — Assessment & Plan Note (Signed)
He is off testosterone.  Lower libido but his PSA and Cr are better.  D/w pt.   I advised him to stay off testosterone for now.

## 2020-11-22 NOTE — Assessment & Plan Note (Signed)
  Lower BP noted, on 5mg  enalapril.  I asked him to stop enalapril and check BP at home.

## 2020-11-22 NOTE — Assessment & Plan Note (Signed)
"  I can do better on the sugar" with diet and exercise changes.   A1c similar to prev ~8.  Continue Metformin and Actos as is for now.  Continue work on diet and exercise.  Recheck in a few months.  See after visit summary.  He agrees.

## 2020-12-01 NOTE — Telephone Encounter (Signed)
Open in error

## 2020-12-07 DIAGNOSIS — H35371 Puckering of macula, right eye: Secondary | ICD-10-CM | POA: Diagnosis not present

## 2020-12-07 DIAGNOSIS — H2513 Age-related nuclear cataract, bilateral: Secondary | ICD-10-CM | POA: Diagnosis not present

## 2020-12-07 DIAGNOSIS — E119 Type 2 diabetes mellitus without complications: Secondary | ICD-10-CM | POA: Diagnosis not present

## 2020-12-07 DIAGNOSIS — H43811 Vitreous degeneration, right eye: Secondary | ICD-10-CM | POA: Diagnosis not present

## 2021-04-26 ENCOUNTER — Other Ambulatory Visit: Payer: Self-pay

## 2021-04-26 ENCOUNTER — Encounter: Payer: Self-pay | Admitting: Family Medicine

## 2021-04-26 ENCOUNTER — Ambulatory Visit (INDEPENDENT_AMBULATORY_CARE_PROVIDER_SITE_OTHER): Payer: PPO | Admitting: Family Medicine

## 2021-04-26 VITALS — BP 120/78 | HR 93 | Temp 97.8°F | Ht 66.0 in | Wt 194.0 lb

## 2021-04-26 DIAGNOSIS — R0602 Shortness of breath: Secondary | ICD-10-CM | POA: Diagnosis not present

## 2021-04-26 DIAGNOSIS — E119 Type 2 diabetes mellitus without complications: Secondary | ICD-10-CM

## 2021-04-26 LAB — CBC WITH DIFFERENTIAL/PLATELET
Basophils Absolute: 0.1 10*3/uL (ref 0.0–0.1)
Basophils Relative: 0.8 % (ref 0.0–3.0)
Eosinophils Absolute: 0.2 10*3/uL (ref 0.0–0.7)
Eosinophils Relative: 2.3 % (ref 0.0–5.0)
HCT: 37.1 % — ABNORMAL LOW (ref 39.0–52.0)
Hemoglobin: 12.2 g/dL — ABNORMAL LOW (ref 13.0–17.0)
Lymphocytes Relative: 25 % (ref 12.0–46.0)
Lymphs Abs: 1.8 10*3/uL (ref 0.7–4.0)
MCHC: 32.9 g/dL (ref 30.0–36.0)
MCV: 88.4 fl (ref 78.0–100.0)
Monocytes Absolute: 0.7 10*3/uL (ref 0.1–1.0)
Monocytes Relative: 9.8 % (ref 3.0–12.0)
Neutro Abs: 4.5 10*3/uL (ref 1.4–7.7)
Neutrophils Relative %: 62.1 % (ref 43.0–77.0)
Platelets: 303 10*3/uL (ref 150.0–400.0)
RBC: 4.2 Mil/uL — ABNORMAL LOW (ref 4.22–5.81)
RDW: 13.8 % (ref 11.5–15.5)
WBC: 7.3 10*3/uL (ref 4.0–10.5)

## 2021-04-26 LAB — HEMOGLOBIN A1C: Hgb A1c MFr Bld: 8.4 % — ABNORMAL HIGH (ref 4.6–6.5)

## 2021-04-26 LAB — COMPREHENSIVE METABOLIC PANEL
ALT: 22 U/L (ref 0–53)
AST: 20 U/L (ref 0–37)
Albumin: 4.2 g/dL (ref 3.5–5.2)
Alkaline Phosphatase: 116 U/L (ref 39–117)
BUN: 25 mg/dL — ABNORMAL HIGH (ref 6–23)
CO2: 28 mEq/L (ref 19–32)
Calcium: 9.8 mg/dL (ref 8.4–10.5)
Chloride: 104 mEq/L (ref 96–112)
Creatinine, Ser: 1.26 mg/dL (ref 0.40–1.50)
GFR: 56.13 mL/min — ABNORMAL LOW (ref >60.00)
Glucose, Bld: 139 mg/dL — ABNORMAL HIGH (ref 70–99)
Potassium: 5.2 mEq/L — ABNORMAL HIGH (ref 3.5–5.1)
Sodium: 141 mEq/L (ref 135–145)
Total Bilirubin: 0.4 mg/dL (ref 0.2–1.2)
Total Protein: 7.1 g/dL (ref 6.0–8.3)

## 2021-04-26 LAB — BRAIN NATRIURETIC PEPTIDE: Pro B Natriuretic peptide (BNP): 31 pg/mL (ref 0.0–100.0)

## 2021-04-26 LAB — TSH: TSH: 2.44 u[IU]/mL (ref 0.35–5.50)

## 2021-04-26 NOTE — Patient Instructions (Signed)
Go to the lab on the way out.   If you have mychart we'll likely use that to update you.    We'll call about seeing cardiology.  Limit exertion.  If you have chest pain, then to go the ER.  Take care.  Glad to see you. Plan on recheck in about 4 months.

## 2021-04-26 NOTE — Progress Notes (Signed)
This visit occurred during the SARS-CoV-2 public health emergency.  Safety protocols were in place, including screening questions prior to the visit, additional usage of staff PPE, and extensive cleaning of exam room while observing appropriate contact time as indicated for disinfecting solutions.  Diabetes:  Using medications without difficulties: metformin and actos, yes.  Hypoglycemic episodes:no Hyperglycemic episodes:no Feet problems: no Blood Sugars averaging: usually ~160, 140s this AM eye exam within last year: fu pending.    Fatigue, "I don't have my energy."  Noted in the last few months.  No CP.  Getting SOB with walking.  Can get SOB with going up stairs.  No BLE edema.  Not SOB supine.  This wasn't going on in 10/2020.    Had been taking prn advil for joint pain, with relief.  Cautions d/w pt.  Had some lower back discomfort, some nonexertonal left shoulder pain- this is positional, when putting on seat belt.    Meds, vitals, and allergies reviewed.   ROS: Per HPI unless specifically indicated in ROS section   GEN: nad, alert and oriented HEENT: ncat NECK: supple w/o LA CV: rrr. PULM: ctab, no inc wob ABD: soft, +bs EXT: no edema SKIN: well perfused  EKG reviewed with patient at office visit.  Diabetic foot exam: Normal inspection No skin breakdown No calluses  Normal DP pulses Normal sensation to light touch and monofilament Nails normal

## 2021-04-28 ENCOUNTER — Other Ambulatory Visit: Payer: Self-pay | Admitting: Family Medicine

## 2021-04-28 DIAGNOSIS — R0602 Shortness of breath: Secondary | ICD-10-CM | POA: Insufficient documentation

## 2021-04-28 DIAGNOSIS — D649 Anemia, unspecified: Secondary | ICD-10-CM

## 2021-04-28 NOTE — Assessment & Plan Note (Signed)
See notes on A1c.  Continue metformin and Actos.

## 2021-04-28 NOTE — Assessment & Plan Note (Signed)
See notes on labs.  Cautions given to patient.  Refer to cardiology.  If any chest pain then to ER.  He understands the plan.  Okay for outpatient follow-up.

## 2021-04-29 NOTE — Progress Notes (Signed)
Cardiology Office Note  Date:  04/30/2021   ID:  Theodore Estes, Theodore Estes [birth year], MRN [id number]  PCP:  Tonia Ghent, MD   Chief Complaint  Patient presents with   New Patient (Initial Visit)    Ref by Dr. Damita Dunnings for shortness of breath on exertion & left shoulder pain. Medications reviewed by the patient verbally.      HPI:  [name] is a pleasant 75 year old gentleman with history of hyperlipidemia, diabetes, hypertension who presents for routine followup of his hyperlipidemia and cardiac risk factors  Last seen by myself in clinic August 2018 Recently seen by primary care August 2022, reported fatigue Some shortness of breath with walking Shortness of breath with going up stairs Feels this is new compared to February 2022  SOB for 2 months, gone downhill Works at [employer] 60 hours  Up 11 pounds from 2018  Lab work reviewed BNP 31 A1c 8.4 trending higher hemoglobin 12 down from his normal 14/15 Total cholesterol 142 LDL 68  Bowel movements not normal past few months, "not much of it"    EKG personally reviewed by myself on todays visit Normal sinus rhythm rate 90 bpm   Other past medical history  stress test x2 in the past, one within the past several years. He is relatively active, is able to walk fast.  Trying to lose weight to help his diabetes.   Hemoglobin A1c is down from 7.8 to 7.2 Prior lab work Total cholesterol 135, LDL 79  PMH:   has a past medical history of Diabetes mellitus without complication ([place]), Dyspnea, Erectile dysfunction, History of colonic polyps, Hyperlipidemia, Hypertension, and Hypotestosteronism.  PSH:    Past Surgical History:  Procedure Laterality Date   COLONOSCOPY  08/18/2003   cyst excised from right wrist  2000   VASECTOMY  1977    Current Outpatient Medications  Medication Sig Dispense Refill   aspirin 81 MG tablet Take 81 mg by mouth daily.     Blood Glucose Monitoring Suppl (ONE TOUCH ULTRA 2) w/Device KIT  Check blood sugar twice a day and as directed. Dx. E11.65 1 each 0   lovastatin (MEVACOR) 20 MG tablet Take 1 tablet (20 mg total) by mouth daily. 90 tablet 3   metFORMIN (GLUCOPHAGE) 1000 MG tablet Take 1 tablet (1,000 mg total) by mouth 2 (two) times daily with a meal. 180 tablet 3   minocycline (DYNACIN) 100 MG tablet Take 100 mg by mouth 2 (two) times daily.     Multiple Vitamins-Minerals (CENTRUM SILVER PO) Take by mouth.     ONETOUCH DELICA LANCETS 11M MISC Check blood sugar twice a day and as directed. Dx. E11.65 100 each 5   ONETOUCH ULTRA test strip USE TO CHECK BLOOD SUGARS TWICE DAILY ASDIRECTED. 100 each 11   pioglitazone (ACTOS) 15 MG tablet Take 1 tablet (15 mg total) by mouth daily. 90 tablet 3   No current facility-administered medications for this visit.     Allergies:   Patient has no known allergies.   Social History:  The patient  reports that he has never smoked. He has never used smokeless tobacco. He reports that he does not drink alcohol and does not use drugs.   Family History:   family history includes Cancer in his mother; Colon cancer (age of onset: 56) in his mother; Diabetes in his brother; Heart disease in his father; Hyperlipidemia in his sister; Hypertension in his brother.    Review of Systems: Review of Systems  Constitutional:  Positive for malaise/fatigue.  HENT: Negative.    Respiratory:  Positive for shortness of breath.   Cardiovascular: Negative.   Gastrointestinal: Negative.   Musculoskeletal: Negative.   Neurological: Negative.   Psychiatric/Behavioral: Negative.    All other systems reviewed and are negative.   PHYSICAL EXAM: VS:  BP 130/70 (BP Location: Right Arm, Patient Position: Sitting, Cuff Size: Normal)   Pulse 90   Ht 5' 6"  (1.676 m)   Wt 195 lb 2 oz (88.5 kg)   SpO2 97%   BMI 31.49 kg/m  , BMI Body mass index is 31.49 kg/m. GEN: Well nourished, well developed, in no acute distress HEENT: normal Neck: no JVD, carotid  bruits, or masses Cardiac: RRR; no murmurs, rubs, or gallops,no edema  Respiratory:  clear to auscultation bilaterally, normal work of breathing GI: soft, nontender, nondistended, + BS MS: no deformity or atrophy Skin: warm and dry, no rash Neuro:  Strength and sensation are intact Psych: euthymic mood, full affect    Recent Labs: 04/26/2021: ALT 22; BUN 25; Creatinine, Ser 1.26; Hemoglobin 12.2; Platelets 303.0; Potassium 5.2 No hemolysis seen; Pro B Natriuretic peptide (BNP) 31.0; Sodium 141; TSH 2.44    Lipid Panel Lab Results  Component Value Date   CHOL 142 08/17/2020   HDL 54.20 08/17/2020   LDLCALC 68 08/17/2020   TRIG 98.0 08/17/2020      Wt Readings from Last 3 Encounters:  04/30/21 195 lb 2 oz (88.5 kg)  04/26/21 194 lb (88 kg)  11/19/20 199 lb (90.3 kg)       ASSESSMENT AND PLAN:  Problem List Items Addressed This Visit       Cardiology Problems   HLD (hyperlipidemia)     Other   Diabetes mellitus without complication (HCC) - Primary   SOBOE (shortness of breath on exertion)   Shortness of breath/fatigue Etiology unclear, could be anemia, could deconditioning, could be cardiac issue BNP low, appears euvolemic Family history of coronary disease Echocardiogram ordered for shortness of breath CT coronary calcium score ordered for risk stratification  Diabetes type 2 poorly controlled We have encouraged continued exercise, careful diet management in an effort to lose weight.  Hyperlipidemia Cholesterol is at goal on the current lipid regimen. No changes to the medications were made.  Anemia Hemoglobin typically 14-15 now running 12 He denies dark stools    Total encounter time more than 45 minutes  Greater than 50% was spent in counseling and coordination of care with the patient    Signed, Esmond Plants, M.D., Ph.D. [place], [place]

## 2021-04-30 ENCOUNTER — Encounter: Payer: Self-pay | Admitting: Cardiovascular Disease

## 2021-04-30 ENCOUNTER — Other Ambulatory Visit: Payer: Self-pay

## 2021-04-30 ENCOUNTER — Ambulatory Visit: Payer: PPO | Admitting: Cardiovascular Disease

## 2021-04-30 VITALS — BP 130/70 | HR 90 | Ht 66.0 in | Wt 195.1 lb

## 2021-04-30 DIAGNOSIS — R0602 Shortness of breath: Secondary | ICD-10-CM

## 2021-04-30 DIAGNOSIS — Z8342 Family history of familial hypercholesterolemia: Secondary | ICD-10-CM | POA: Diagnosis not present

## 2021-04-30 DIAGNOSIS — E782 Mixed hyperlipidemia: Secondary | ICD-10-CM | POA: Diagnosis not present

## 2021-04-30 DIAGNOSIS — E119 Type 2 diabetes mellitus without complications: Secondary | ICD-10-CM

## 2021-04-30 NOTE — Patient Instructions (Addendum)
Medication Instructions:  No changes  If you need a refill on your cardiac medications before your next appointment, please call your pharmacy.    Lab work: No new labs needed  Testing/Procedures: Echo  CT coronary calcium score  Follow-Up: At [facility], you and your health needs are our priority.  As part of our continuing mission to provide you with exceptional heart care, we have created designated Provider Care Teams.  These Care Teams include your primary Cardiologist (physician) and Advanced Practice Providers (APPs -  Physician Assistants and Nurse Practitioners) who all work together to provide you with the care you need, when you need it.  You will need a follow up appointment: we will call with results  Providers on your designated Care Team:   Murray Hodgkins, NP Christell Faith, PA-C Marrianne Mood, PA-C Cadence Vonore, [place]  COVID-19 Vaccine Information can be found at: [email or web address].co.uk For questions related to vaccine distribution or appointments, please email [email or web address] or call [phone].   We have order a CT coronary calcium score (you may see results on your MyChart account, but we will call via phone with the results) This is a $99 out of pocket expense   This procedure uses special x-ray equipment to produce pictures of the coronary arteries to determine if they are blocked or narrowed by the buildup of plaque - an indicator for atherosclerosis or coronary artery disease (CAD).  Please call [phone] to schedule at your earliest convince   This is done at our [facility] in [facility] [place], [place] [address]  Your physician has requested that you have an Echocardiogram An echocardiogram is a test that uses sound waves (ultrasound) to produce images of the heart. Images from an echocardiogram can provide  important information about: Heart size and shape. The size and thickness and movement of your heart's walls. Heart muscle function and strength. Heart valve function or if you have stenosis. Stenosis is when the heart valves are too narrow. If blood is flowing backward through the heart valves (regurgitation). A tumor or infectious growth around the heart valves. Areas of heart muscle that are not working well because of poor blood flow or injury from a heart attack. Aneurysm detection. An aneurysm is a weak or damaged part of an artery wall. The wall bulges out from the normal force of blood pumping through the body. Tell a health care provider about: Any allergies you have. All medicines you are taking, including vitamins, herbs, eye drops, creams, and over-the-counter medicines. Any blood disorders you have. Any surgeries you have had. Any medical conditions you have. Whether you are pregnant or may be pregnant. What are the risks? Generally, this is a safe test. However, problems may occur, including an allergic reaction to dye (contrast) that may be used during the test. What happens before the test? No specific preparation is needed. You may eat and drink normally. What happens during the test?  You will take off your clothes from the waist up and put on a hospital gown. Electrodes or electrocardiogram (ECG)patches may be placed on your chest. The electrodes or patches are then connected to a device that monitors your heart rate and rhythm. You will lie down on a table for an ultrasound exam. A gel will be applied to your chest to help sound waves pass through your skin. A handheld device, called a transducer, will be pressed against your chest and moved over your heart. The transducer  produces sound waves that travel to your heart and bounce back (or "echo" back) to the transducer. These sound waves will be captured in real-time and changed into images of your heart that can be  viewed on a video monitor. The images will be recorded on a computer and reviewed by your health care provider. You may be asked to change positions or hold your breath for a short time. This makes it easier to get different views or better views of your heart. In some cases, you may receive contrast through an IV in one of your veins. This can improve the quality of the pictures from your heart. The procedure may vary among health care providers and hospitals. What can I expect after the test? You may return to your normal, everyday life, including diet, activities, andmedicines, unless your health care provider tells you not to do that. Follow these instructions at home: It is up to you to get the results of your test. Ask your health care provider, or the department that is doing the test, when your results will be ready. Keep all follow-up visits. This is important. Summary An echocardiogram is a test that uses sound waves (ultrasound) to produce images of the heart. Images from an echocardiogram can provide important information about the size and shape of your heart, heart muscle function, heart valve function, and other possible heart problems. You do not need to do anything to prepare before this test. You may eat and drink normally. After the echocardiogram is completed, you may return to your normal, everyday life, unless your health care provider tells you not to do that. This information is not intended to replace advice given to you by your health care provider. Make sure you discuss any questions you have with your healthcare provider. Document Revised: 05/05/2020 Document Reviewed: 05/05/2020 Elsevier Patient Education  2022 [place]. . Echocardiography is a painless test that uses sound waves to create images of your heart. It provides your doctor with information about the size and shape of your heart and how well your heart's chambers and valves are working. This procedure  takes approximately one hour. There are no restrictions for this procedure.  There is a possibility that an IV may need to be started during your test to inject an image enhancing agent. This is done to obtain more optimal pictures of your heart. Therefore we ask that you do at least drink some water prior to coming in to hydrate your veins.

## 2021-05-04 ENCOUNTER — Ambulatory Visit (INDEPENDENT_AMBULATORY_CARE_PROVIDER_SITE_OTHER): Payer: PPO

## 2021-05-04 ENCOUNTER — Other Ambulatory Visit: Payer: Self-pay | Admitting: Cardiovascular Disease

## 2021-05-04 ENCOUNTER — Other Ambulatory Visit: Payer: Self-pay

## 2021-05-04 DIAGNOSIS — R0602 Shortness of breath: Secondary | ICD-10-CM | POA: Diagnosis not present

## 2021-05-04 DIAGNOSIS — E119 Type 2 diabetes mellitus without complications: Secondary | ICD-10-CM

## 2021-05-05 LAB — ECHOCARDIOGRAM COMPLETE
AR max vel: 2.33 cm2
AV Area VTI: 2.48 cm2
AV Area mean vel: 2.55 cm2
AV Mean grad: 4 mmHg
AV Peak grad: 7 mmHg
Ao pk vel: 1.32 m/s
Area-P 1/2: 3.85 cm2
Calc EF: 54.5 %
S' Lateral: 2.8 cm
Single Plane A2C EF: 54.2 %
Single Plane A4C EF: 52.7 %

## 2021-05-06 ENCOUNTER — Other Ambulatory Visit: Payer: Self-pay

## 2021-05-06 ENCOUNTER — Ambulatory Visit
Admission: RE | Admit: 2021-05-06 | Discharge: 2021-05-06 | Disposition: A | Discharge status: Home / Self Care | Payer: PPO | Source: Ambulatory Visit | Attending: Cardiovascular Disease | Admitting: Cardiovascular Disease

## 2021-05-06 ENCOUNTER — Telehealth: Payer: Self-pay

## 2021-05-06 DIAGNOSIS — Z8342 Family history of familial hypercholesterolemia: Secondary | ICD-10-CM | POA: Insufficient documentation

## 2021-05-06 DIAGNOSIS — E119 Type 2 diabetes mellitus without complications: Secondary | ICD-10-CM | POA: Insufficient documentation

## 2021-05-06 DIAGNOSIS — E782 Mixed hyperlipidemia: Secondary | ICD-10-CM | POA: Insufficient documentation

## 2021-05-06 DIAGNOSIS — R0602 Shortness of breath: Secondary | ICD-10-CM | POA: Insufficient documentation

## 2021-05-06 NOTE — Telephone Encounter (Signed)
Able to reach pt regarding his recent ECHO Dr. Rockey Situ had a chance to review his results and advised   "Echo  Normal cardiac function,  No significant valve disorder with would explain new shortness of breath symptoms  We are waiting on calcium scoring "  Theodore Estes thankful for the results call with good results, having his CT cardiac scoring test this evening at 2 pm. Otherwise all questions or concerns were address and no additional concerns at this time.

## 2021-05-10 ENCOUNTER — Telehealth: Payer: Self-pay

## 2021-05-10 NOTE — Telephone Encounter (Signed)
Able to reach pt's wife regarding Theodore Estes recent CT Calcium Scoring. Dr. Rockey Situ had a chance to review his results and advised   "CT coronary calcium score   Score of 0  Excellent finding, indicating no calcified coronary atherosclerotic plaque noted  No other significant findings on the scan were picked up "  Theodore Estes very pleased with results, will tell her husband Theodore Estes the results form Dr. Rockey Situ, both has also seen results on MyChart, question about pt's liver, advised to reach out to PCP regarding "Hepatic steatosis" noted on results. Theodore Estes verbalized understanding, otherwise all questions or concerns were address and no additional concerns at this time, will call back for anything further.

## 2021-05-20 ENCOUNTER — Telehealth: Payer: Self-pay

## 2021-05-20 NOTE — Telephone Encounter (Addendum)
If SOB/lightheaded, then I would advice him to get to ER.

## 2021-05-20 NOTE — Telephone Encounter (Signed)
Lauren on phones has already sent pt to access nurse for triage since I was with a pt.

## 2021-05-20 NOTE — Telephone Encounter (Signed)
Nurse Assessment Nurse: Altamease Oiler, RN, Adriana Date/Time (Eastern Time): 05/20/2021 8:52:46 AM Confirm and document reason for call. If symptomatic, describe symptoms. ---caller states pt was seen about 4 weeks ago. pt was sent to cardiologist. pt has been very tired and fatigued. pt reported that he felt like he may pass out last night. reports dizziness and shortness of breath when climbing stairs. pt has already been evaluated for same. bgl 171 Does the patient have any new or worsening symptoms? ---Yes Will a triage be completed? ---Yes Related visit to physician within the last 2 weeks? ---Yes Does the PT have any chronic conditions? (i.e. diabetes, asthma, this includes High risk factors for pregnancy, etc.) ---Yes List chronic conditions. ---diabetes Is this a behavioral health or substance abuse call? ---No Guidelines Guideline Title Affirmed Question Affirmed Notes Nurse Date/Time (Eastern Time) Weakness (Generalized) and Fatigue [1] MODERATE weakness (i.e., interferes with work, school, normal activities) AND [2] cause unknown (Exceptions: Altamease Oiler, Therapist, sports, Adriana 05/20/2021 9:00:03 AM PLEASE NOTE: All timestamps contained within this report are represented as [place] Standard Time. CONFIDENTIALTY NOTICE: This fax transmission is intended only for the addressee. It contains information that is legally privileged, confidential or otherwise protected from use or disclosure. If you are not the intended recipient, you are strictly prohibited from reviewing, disclosing, copying using or disseminating any of this information or taking any action in reliance on or regarding this information. If you have received this fax in error, please notify us immediately by telephone so that we can arrange for its return to [place]. Phone: [phone], Toll-Free: [phone], Fax: [phone] Page: 2 of 2 Call Id: [id number] Guidelines Guideline Title Affirmed Question Affirmed Notes Nurse  Date/Time Eilene Ghazi Time) weakness with acute minor illness, or weakness from poor fluid intake) Disp. Time Eilene Ghazi Time) Disposition Final User 05/20/2021 9:14:04 AM See HCP within 4 Hours (or PCP triage) Yes Altamease Oiler, RN, Fabio Bering Caller Disagree/Comply Comply Caller Understands Yes PreDisposition Call Doctor Care Advice Given Per Guideline SEE HCP (OR PCP TRIAGE) WITHIN 4 HOURS: CALL BACK IF: * You become worse CARE ADVICE given per Weakness and Fatigue (Adult) guideline. * IF OFFICE WILL BE OPEN: You need to be seen within the next 3 or 4 hours. Call your doctor (or NP/PA) now or as soon as the office opens. Comments User: Kizzie Fantasia, RN Date/Time Eilene Ghazi Time): 05/20/2021 9:11:51 AM Denisha in the office states they have no appts available for today. Referrals GO TO FACILITY OTHER - SPECIFY

## 2021-05-20 NOTE — Telephone Encounter (Signed)
Patient's wife called in about the issues below, states that she has noticed that he has been getting up in the middle of the night with frequent urination, has been complaining of back pain as well. Wife is very concerned and would like if Dr.Duncan to advise of what they should do.

## 2021-05-20 NOTE — Telephone Encounter (Signed)
Patient refused ER recommendations and stated he just wanted labs done. Patient okay with coming in to be seen here to see Romilda Garret, NP tomorrow at 10 am. Also discussed this with Romilda Garret, NP.

## 2021-05-20 NOTE — Telephone Encounter (Signed)
Patient's assistant is calling in stating that Theodore Estes contacted her last night and was telling her that he is feeling very well and just having really bad fatigue, feeling the need to passout, absolutely no energy, and when taking blood sugar this morning noticed his blood was very thin. Wanting to know if they can be worked in with [place], advised that it is best we have him triaged first.

## 2021-05-21 ENCOUNTER — Ambulatory Visit (INDEPENDENT_AMBULATORY_CARE_PROVIDER_SITE_OTHER): Payer: PPO | Admitting: Nurse Practitioner

## 2021-05-21 ENCOUNTER — Other Ambulatory Visit: Payer: Self-pay

## 2021-05-21 VITALS — BP 136/82 | HR 81 | Temp 97.8°F | Ht 66.0 in | Wt 197.2 lb

## 2021-05-21 DIAGNOSIS — R5383 Other fatigue: Secondary | ICD-10-CM | POA: Insufficient documentation

## 2021-05-21 DIAGNOSIS — R42 Dizziness and giddiness: Secondary | ICD-10-CM | POA: Insufficient documentation

## 2021-05-21 DIAGNOSIS — R0602 Shortness of breath: Secondary | ICD-10-CM | POA: Diagnosis not present

## 2021-05-21 DIAGNOSIS — R7989 Other specified abnormal findings of blood chemistry: Secondary | ICD-10-CM | POA: Insufficient documentation

## 2021-05-21 DIAGNOSIS — R351 Nocturia: Secondary | ICD-10-CM | POA: Diagnosis not present

## 2021-05-21 LAB — POC URINALSYSI DIPSTICK (AUTOMATED)
Bilirubin, UA: NEGATIVE
Blood, UA: NEGATIVE
Glucose, UA: POSITIVE — AB
Ketones, UA: NEGATIVE
Leukocytes, UA: NEGATIVE
Nitrite, UA: NEGATIVE
Protein, UA: POSITIVE — AB
Spec Grav, UA: >=1.03 — AB (ref 1.010–1.025)
Urobilinogen, UA: 0.2 E.U./dL
pH, UA: 5.5 (ref 5.0–8.0)

## 2021-05-21 LAB — CBC WITH DIFFERENTIAL/PLATELET
Basophils Absolute: 0 10*3/uL (ref 0.0–0.1)
Basophils Relative: 0.8 % (ref 0.0–3.0)
Eosinophils Absolute: 0.1 10*3/uL (ref 0.0–0.7)
Eosinophils Relative: 2.3 % (ref 0.0–5.0)
HCT: 35.3 % — ABNORMAL LOW (ref 39.0–52.0)
Hemoglobin: 11.7 g/dL — ABNORMAL LOW (ref 13.0–17.0)
Lymphocytes Relative: 25.7 % (ref 12.0–46.0)
Lymphs Abs: 1.6 10*3/uL (ref 0.7–4.0)
MCHC: 33.3 g/dL (ref 30.0–36.0)
MCV: 87.7 fl (ref 78.0–100.0)
Monocytes Absolute: 0.7 10*3/uL (ref 0.1–1.0)
Monocytes Relative: 10.4 % (ref 3.0–12.0)
Neutro Abs: 3.8 10*3/uL (ref 1.4–7.7)
Neutrophils Relative %: 60.8 % (ref 43.0–77.0)
Platelets: 216 10*3/uL (ref 150.0–400.0)
RBC: 4.02 Mil/uL — ABNORMAL LOW (ref 4.22–5.81)
RDW: 14.1 % (ref 11.5–15.5)
WBC: 6.3 10*3/uL (ref 4.0–10.5)

## 2021-05-21 LAB — COMPREHENSIVE METABOLIC PANEL
ALT: 20 U/L (ref 0–53)
AST: 18 U/L (ref 0–37)
Albumin: 4.1 g/dL (ref 3.5–5.2)
Alkaline Phosphatase: 118 U/L — ABNORMAL HIGH (ref 39–117)
BUN: 25 mg/dL — ABNORMAL HIGH (ref 6–23)
CO2: 27 mEq/L (ref 19–32)
Calcium: 9.5 mg/dL (ref 8.4–10.5)
Chloride: 105 mEq/L (ref 96–112)
Creatinine, Ser: 1.23 mg/dL (ref 0.40–1.50)
GFR: 57.75 mL/min — ABNORMAL LOW (ref >60.00)
Glucose, Bld: 112 mg/dL — ABNORMAL HIGH (ref 70–99)
Potassium: 5 mEq/L (ref 3.5–5.1)
Sodium: 140 mEq/L (ref 135–145)
Total Bilirubin: 0.4 mg/dL (ref 0.2–1.2)
Total Protein: 6.8 g/dL (ref 6.0–8.3)

## 2021-05-21 LAB — IBC + FERRITIN
Ferritin: 181.7 ng/mL (ref 22.0–322.0)
Iron: 78 ug/dL (ref 42–165)
Saturation Ratios: 19.8 % — ABNORMAL LOW (ref 20.0–50.0)
TIBC: 393.4 ug/dL (ref 250.0–450.0)
Transferrin: 281 mg/dL (ref 212.0–360.0)

## 2021-05-21 LAB — PSA: PSA: 2.73 ng/mL (ref 0.10–4.00)

## 2021-05-21 LAB — TSH: TSH: 3.39 u[IU]/mL (ref 0.35–5.50)

## 2021-05-21 NOTE — Assessment & Plan Note (Addendum)
This is a ongoing issue. Has not changed since he saw Dr. Damita Dunnings or since he has seen the cardiologist. EKG normal in office.

## 2021-05-21 NOTE — Assessment & Plan Note (Signed)
With certain activities. He states that he noticed it the other day after work when he was lifting things. States it is not heavy objects. Neuro exam intact.

## 2021-05-21 NOTE — Assessment & Plan Note (Signed)
Patient states as of late he is experiencing more nocturia. He correlates it to his fluid intake. No history of prostate cancer. No history of prostate problems. UA looks ok. Will send off PSA to make sure no prostatitis

## 2021-05-21 NOTE — Patient Instructions (Addendum)
Will be in touch with labs Be sure you are drinking plenty of fluids especially with hot weather. Your urine showed you maybe a little dehydrated Will refer you to sleep lab to make sure you do not have sleep apnea. Follow up if symptoms worsen or fail to improve.

## 2021-05-21 NOTE — Assessment & Plan Note (Signed)
Maybe related to poor sleep. He can go to sleep but wakes up some. Does snore. Has never been ruled out for sleep apnea.

## 2021-05-21 NOTE — Assessment & Plan Note (Signed)
Lower hemoglobin than normal for him. Denies and blood in stool or urine. States mother died of colon cancer in her 54s. Repeat and look for other causes if not eithre colonoscopy of FOB in office

## 2021-05-21 NOTE — Progress Notes (Signed)
Acute Office Visit  Subjective:    Patient ID: Theodore Estes, male    DOB: 08/01/1946, 75 y.o.   MRN: [id number]  Chief Complaint  Patient presents with   Fatigue   Dizziness    States this happens when he tries to lift something.   balance issues    On uneven ground    Fall    1 month ago and hit R flank area    Shortness of Breath    X 1 month when doing ADL's (ie. Dressing, showering)     Patient is in today for   Fatigue: States he noticed it approx 2 months worth and increasing. States that he goes to sleep but wakes frequently. Has been having increased nocturia for the approx last week. Feels like he cannot empty all the way. States that his wife says that he snores.   Dizziness: Has been increasing over the last week. More of light headed feeling worse with exertion.  Balance: flat ground. States that going up steps he feels more off balance. No hx of stroke or blood clot  Fall: fell approx 1.5 month. Striking his right side. Patient states he has no complaints or concerns in regards to that now.  SHOB: DOE Stable no progression. Sees cardiology   Check sugars every morning Was 179 this morning. Average about 160.  Past Medical History:  Diagnosis Date   Diabetes mellitus without complication (HCC)    Dyspnea    Erectile dysfunction    History of colonic polyps    Hyperlipidemia    Hypertension    Hypotestosteronism     Past Surgical History:  Procedure Laterality Date   COLONOSCOPY  08/18/2003   cyst excised from right wrist  2000   VASECTOMY  1977    Family History  Problem Relation Age of Onset   Cancer Mother        colon   Colon cancer Mother 17   Heart disease Father    Diabetes Brother    Hypertension Brother    Hyperlipidemia Sister    Prostate cancer Neg Hx     Social History   Socioeconomic History   Marital status: Married    Spouse name: Not on file   Number of children: Not on file   Years of education: Not on file    Highest education level: Not on file  Occupational History   Not on file  Tobacco Use   Smoking status: Never   Smokeless tobacco: Never  Vaping Use   Vaping Use: Never used  Substance and Sexual Activity   Alcohol use: No    Alcohol/week: 0.0 standard drinks   Drug use: No   Sexual activity: Yes    Partners: Female  Other Topics Concern   Not on file  Social History Narrative   HSG, [employer]   Work: Sports coach   Married '69   2 daughters '76 '77'    5 grandchildren   Marriage in good health   Social Determinants of Health   Financial Resource Strain: Not on file  Food Insecurity: Not on file  Transportation Needs: Not on file  Physical Activity: Not on file  Stress: Not on file  Social Connections: Not on file  Intimate Partner Violence: Not on file    Outpatient Medications Prior to Visit  Medication Sig Dispense Refill   aspirin 81 MG tablet Take 81 mg by mouth daily.     Blood Glucose Monitoring Suppl (ONE  TOUCH ULTRA 2) w/Device KIT Check blood sugar twice a day and as directed. Dx. E11.65 1 each 0   lovastatin (MEVACOR) 20 MG tablet Take 1 tablet (20 mg total) by mouth daily. 90 tablet 3   metFORMIN (GLUCOPHAGE) 1000 MG tablet Take 1 tablet (1,000 mg total) by mouth 2 (two) times daily with a meal. 180 tablet 3   minocycline (DYNACIN) 100 MG tablet Take 100 mg by mouth 2 (two) times daily.     Multiple Vitamins-Minerals (CENTRUM SILVER PO) Take by mouth.     ONETOUCH DELICA LANCETS 67E MISC Check blood sugar twice a day and as directed. Dx. E11.65 100 each 5   ONETOUCH ULTRA test strip USE TO CHECK BLOOD SUGARS TWICE DAILY ASDIRECTED. 100 each 11   pioglitazone (ACTOS) 15 MG tablet Take 1 tablet (15 mg total) by mouth daily. 90 tablet 3   No facility-administered medications prior to visit.    No Known Allergies  Review of Systems  Constitutional:  Negative for chills and fever.  Respiratory:  Positive for shortness of breath.   Cardiovascular:   Negative for chest pain.  Gastrointestinal:  Negative for abdominal pain, blood in stool, diarrhea, nausea and vomiting.  Genitourinary:  Negative for penile discharge, penile pain, penile swelling, scrotal swelling and testicular pain.  Neurological:  Positive for dizziness and light-headedness.      Objective:    Physical Exam Vitals and nursing note reviewed.  Constitutional:      Appearance: He is well-developed.  HENT:     Right Ear: Tympanic membrane, ear canal and external ear normal. There is no impacted cerumen.     Left Ear: Tympanic membrane, ear canal and external ear normal. There is no impacted cerumen.     Mouth/Throat:     Mouth: Mucous membranes are moist.     Pharynx: Oropharynx is clear.  Eyes:     Extraocular Movements: Extraocular movements intact.     Pupils: Pupils are equal, round, and reactive to light.  Neck:     Thyroid: No thyromegaly.  Cardiovascular:     Rate and Rhythm: Normal rate and regular rhythm.     Heart sounds: Normal heart sounds.  Pulmonary:     Effort: Pulmonary effort is normal.     Breath sounds: Normal breath sounds.  Abdominal:     General: Bowel sounds are normal.  Musculoskeletal:     Right lower leg: No edema.     Left lower leg: No edema.  Lymphadenopathy:     Cervical: No cervical adenopathy.  Neurological:     General: No focal deficit present.     Mental Status: He is alert.     Motor: No weakness.     Coordination: Coordination normal.     Gait: Gait normal.     Deep Tendon Reflexes: Reflexes normal.  Psychiatric:        Mood and Affect: Mood normal.        Behavior: Behavior normal.        Thought Content: Thought content normal.        Judgment: Judgment normal.    BP 136/82   Pulse 81   Temp 97.8 F (36.6 C) (Temporal)   Ht _0  (1.676 m)   Wt 197 lb 4 oz (89.5 kg)   SpO2 99%   BMI 31.84 kg/m  Wt Readings from Last 3 Encounters:  05/21/21 197 lb 4 oz (89.5 kg)  04/30/21 195 lb 2 oz (88.5 kg)  04/26/21 194 lb (88 kg)    Health Maintenance Due  Topic Date Due   COVID-19 Vaccine (4 - Booster for Moderna series) 11/24/2020   OPHTHALMOLOGY EXAM  03/25/2021   INFLUENZA VACCINE  04/26/2021    There are no preventive care reminders to display for this patient.   Lab Results  Component Value Date   TSH 2.44 04/26/2021   Lab Results  Component Value Date   WBC 7.3 04/26/2021   HGB 12.2 (L) 04/26/2021   HCT 37.1 (L) 04/26/2021   MCV 88.4 04/26/2021   PLT 303.0 04/26/2021   Lab Results  Component Value Date   NA 141 04/26/2021   K 5.2 No hemolysis seen (H) 04/26/2021   CO2 28 04/26/2021   GLUCOSE 139 (H) 04/26/2021   BUN 25 (H) 04/26/2021   CREATININE 1.26 04/26/2021   BILITOT 0.4 04/26/2021   ALKPHOS 116 04/26/2021   AST 20 04/26/2021   ALT 22 04/26/2021   PROT 7.1 04/26/2021   ALBUMIN 4.2 04/26/2021   CALCIUM 9.8 04/26/2021   GFR 56.13 (L) 04/26/2021   Lab Results  Component Value Date   CHOL 142 08/17/2020   Lab Results  Component Value Date   HDL 54.20 08/17/2020   Lab Results  Component Value Date   LDLCALC 68 08/17/2020   Lab Results  Component Value Date   TRIG 98.0 08/17/2020   Lab Results  Component Value Date   CHOLHDL 3 08/17/2020   Lab Results  Component Value Date   HGBA1C 8.4 (H) 04/26/2021       Assessment & Plan:   Problem List Items Addressed This Visit       Other   SOBOE (shortness of breath on exertion)    This is a ongoing issue. Has not changed since he saw Dr. Damita Dunnings or since he has seen the cardiologist. EKG normal in office.      Relevant Orders   EKG 12-Lead (Completed)   TSH   CBC with Differential/Platelet   Lightheadedness    With certain activities. He states that he noticed it the other day after work when he was lifting things. States it is not heavy objects. Neuro exam intact.      Relevant Orders   EKG 12-Lead (Completed)   Nocturia    Patient states as of late he is experiencing more  nocturia. He correlates it to his fluid intake. No history of prostate cancer. No history of prostate problems. UA looks ok. Will send off PSA to make sure no prostatitis       Relevant Orders   POCT Urinalysis Dipstick (Automated) (Completed)   PSA   Abnormal CBC    Lower hemoglobin than normal for him. Denies and blood in stool or urine. States mother died of colon cancer in her 10s. Repeat and look for other causes if not eithre colonoscopy of FOB in office      Relevant Orders   CBC with Differential/Platelet   IBC + Ferritin   Other fatigue - Primary    Maybe related to poor sleep. He can go to sleep but wakes up some. Does snore. Has never been ruled out for sleep apnea.      Relevant Orders   Comprehensive metabolic panel   TSH   Ambulatory referral to Pulmonology     No orders of the defined types were placed in this encounter.  This visit occurred during the SARS-CoV-2 public health emergency.  Safety protocols were in place, including screening  questions prior to the visit, additional usage of staff PPE, and extensive cleaning of exam room while observing appropriate contact time as indicated for disinfecting solutions.   Romilda Garret, NP

## 2021-05-25 ENCOUNTER — Other Ambulatory Visit: Payer: Self-pay | Admitting: Family Medicine

## 2021-05-25 DIAGNOSIS — Z1211 Encounter for screening for malignant neoplasm of colon: Secondary | ICD-10-CM

## 2021-06-01 ENCOUNTER — Encounter: Payer: Self-pay | Admitting: Nurse Practitioner

## 2021-06-02 ENCOUNTER — Other Ambulatory Visit: Payer: Self-pay | Admitting: Family Medicine

## 2021-06-02 DIAGNOSIS — D649 Anemia, unspecified: Secondary | ICD-10-CM

## 2021-06-02 DIAGNOSIS — R42 Dizziness and giddiness: Secondary | ICD-10-CM

## 2021-06-02 NOTE — Telephone Encounter (Signed)
Patient advised and he will let us know in regards to the b/p reading and sugar reading. He will come for labs tomorrow at 2 pm

## 2021-06-02 NOTE — Telephone Encounter (Signed)
Please call patient.  See if he can come in for a lab appointment tomorrow.  We need to recheck his labs.  I put in the follow-up orders.  See if he can check his blood pressure and blood sugar at home and let us know about that.  Make sure he is drinking plenty of fluid, enough to keep his urine clear.  Thanks.

## 2021-06-03 ENCOUNTER — Other Ambulatory Visit (INDEPENDENT_AMBULATORY_CARE_PROVIDER_SITE_OTHER): Payer: PPO

## 2021-06-03 ENCOUNTER — Other Ambulatory Visit: Payer: Self-pay

## 2021-06-03 DIAGNOSIS — R42 Dizziness and giddiness: Secondary | ICD-10-CM | POA: Diagnosis not present

## 2021-06-03 DIAGNOSIS — D649 Anemia, unspecified: Secondary | ICD-10-CM | POA: Diagnosis not present

## 2021-06-03 LAB — CBC WITH DIFFERENTIAL/PLATELET
Basophils Absolute: 0.1 10*3/uL (ref 0.0–0.1)
Basophils Relative: 0.8 % (ref 0.0–3.0)
Eosinophils Absolute: 0.2 10*3/uL (ref 0.0–0.7)
Eosinophils Relative: 2.2 % (ref 0.0–5.0)
HCT: 34.6 % — ABNORMAL LOW (ref 39.0–52.0)
Hemoglobin: 11.4 g/dL — ABNORMAL LOW (ref 13.0–17.0)
Lymphocytes Relative: 23.4 % (ref 12.0–46.0)
Lymphs Abs: 1.6 10*3/uL (ref 0.7–4.0)
MCHC: 32.9 g/dL (ref 30.0–36.0)
MCV: 88.1 fl (ref 78.0–100.0)
Monocytes Absolute: 0.6 10*3/uL (ref 0.1–1.0)
Monocytes Relative: 8.8 % (ref 3.0–12.0)
Neutro Abs: 4.5 10*3/uL (ref 1.4–7.7)
Neutrophils Relative %: 64.8 % (ref 43.0–77.0)
Platelets: 249 10*3/uL (ref 150.0–400.0)
RBC: 3.93 Mil/uL — ABNORMAL LOW (ref 4.22–5.81)
RDW: 14 % (ref 11.5–15.5)
WBC: 6.9 10*3/uL (ref 4.0–10.5)

## 2021-06-03 LAB — BASIC METABOLIC PANEL
BUN: 25 mg/dL — ABNORMAL HIGH (ref 6–23)
CO2: 28 mEq/L (ref 19–32)
Calcium: 9.2 mg/dL (ref 8.4–10.5)
Chloride: 103 mEq/L (ref 96–112)
Creatinine, Ser: 1.51 mg/dL — ABNORMAL HIGH (ref 0.40–1.50)
GFR: 45.14 mL/min — ABNORMAL LOW (ref >60.00)
Glucose, Bld: 177 mg/dL — ABNORMAL HIGH (ref 70–99)
Potassium: 4.4 mEq/L (ref 3.5–5.1)
Sodium: 140 mEq/L (ref 135–145)

## 2021-06-03 LAB — VITAMIN B12: Vitamin B-12: 309 pg/mL (ref 211–911)

## 2021-06-03 LAB — IRON: Iron: 69 ug/dL (ref 42–165)

## 2021-06-07 ENCOUNTER — Encounter: Payer: Self-pay | Admitting: Family Medicine

## 2021-06-08 NOTE — Telephone Encounter (Signed)
I spoke with pt; pt said intermittent SOB and dizziness started about 2 months ago. Pt said he saw a cardiologist and was advised everything looked great and pt did not have a FU appt with card. No H/A or CP. Pt said he is most concerned about his hgb dropping. Pt has not seen any bleeding and no black stools and no abd pain. Pt said he gets tired easily with example being after pt gets dressed in the morning he is really tired. Pt said he just does not have energy. Pt also continues with back pain that runs down lt leg frequently. Pt said the leg pain is an achy dull pain that is there about 75% of the time. I offered pt an appt with Dr Damita Dunnings and pt said he does not want to schedule appt with Dr Damita Dunnings at this time and prefers to wait on GI referral appt due to pt being most concerned about hgb going down. UC & ED precautions given and pt voiced understanding. Pt will cb if decides wants appt with Dr Damita Dunnings about back and leg pain. Sending note to Dr Damita Dunnings and Janett Billow CMA.

## 2021-06-08 NOTE — Telephone Encounter (Signed)
Theodore Billow- Please call patient/wife.  I put in referral to GI clinic regarding colon cancer screening/anemia evaluation on 05/25/2021.  Please triage patient in the meantime.  Also routed this note to [place]-please see when patient can see the GI clinic.  That needs to be scheduled in the near future.  Thanks.

## 2021-06-09 ENCOUNTER — Telehealth: Payer: Self-pay | Admitting: Family Medicine

## 2021-06-09 DIAGNOSIS — H43811 Vitreous degeneration, right eye: Secondary | ICD-10-CM | POA: Diagnosis not present

## 2021-06-09 DIAGNOSIS — E119 Type 2 diabetes mellitus without complications: Secondary | ICD-10-CM | POA: Diagnosis not present

## 2021-06-09 DIAGNOSIS — H2513 Age-related nuclear cataract, bilateral: Secondary | ICD-10-CM | POA: Diagnosis not present

## 2021-06-09 DIAGNOSIS — D649 Anemia, unspecified: Secondary | ICD-10-CM

## 2021-06-09 DIAGNOSIS — H35371 Puckering of macula, right eye: Secondary | ICD-10-CM | POA: Diagnosis not present

## 2021-06-09 NOTE — Telephone Encounter (Signed)
Please call pt.  I checked with Dr. Carlean Purl with the GI clinic.  He is working on getting the patient scheduled.  In the meantime, we talked about concurrent hematology evaluation and that is reasonable to do.  I put in a referral for the hematology clinic.  Please explain to him that the hematology clinic (the "blood" clinic) is also the cancer center.  There are patients who do not have cancer but have a blood disorder (such as anemia) who need to be seen.  Even though his appointment would be at the cancer center it is not necessarily because he has cancer.  This is a routine conversation I have with patients when they referred to hematology regarding anemia.  Thanks.

## 2021-06-11 NOTE — Telephone Encounter (Signed)
LMTCB and also sent message to patient via mychart. Patient is scheduled to see oncology 06/23/21 at 9:30 am.

## 2021-06-14 ENCOUNTER — Encounter: Payer: Self-pay | Admitting: Gastroenterology

## 2021-06-23 ENCOUNTER — Inpatient Hospital Stay: Payer: PPO | Attending: Oncology | Admitting: Oncology

## 2021-06-23 ENCOUNTER — Other Ambulatory Visit: Payer: Self-pay

## 2021-06-23 ENCOUNTER — Inpatient Hospital Stay: Payer: PPO

## 2021-06-23 ENCOUNTER — Encounter: Payer: Self-pay | Admitting: Oncology

## 2021-06-23 VITALS — BP 177/89 | HR 78 | Temp 96.4°F | Resp 16 | Wt 195.0 lb

## 2021-06-23 DIAGNOSIS — I10 Essential (primary) hypertension: Secondary | ICD-10-CM | POA: Diagnosis not present

## 2021-06-23 DIAGNOSIS — E119 Type 2 diabetes mellitus without complications: Secondary | ICD-10-CM | POA: Diagnosis not present

## 2021-06-23 DIAGNOSIS — Z808 Family history of malignant neoplasm of other organs or systems: Secondary | ICD-10-CM | POA: Insufficient documentation

## 2021-06-23 DIAGNOSIS — D509 Iron deficiency anemia, unspecified: Secondary | ICD-10-CM | POA: Diagnosis not present

## 2021-06-23 DIAGNOSIS — D649 Anemia, unspecified: Secondary | ICD-10-CM

## 2021-06-23 DIAGNOSIS — N1831 Chronic kidney disease, stage 3a: Secondary | ICD-10-CM

## 2021-06-23 LAB — RETIC PANEL
Immature Retic Fract: 15.4 % (ref 2.3–15.9)
RBC.: 4.09 MIL/uL — ABNORMAL LOW (ref 4.22–5.81)
Retic Count, Absolute: 73.2 10*3/uL (ref 19.0–186.0)
Retic Ct Pct: 1.8 % (ref 0.4–3.1)
Reticulocyte Hemoglobin: 33.5 pg (ref >27.9)

## 2021-06-23 LAB — CBC WITH DIFFERENTIAL/PLATELET
Abs Immature Granulocytes: 0.03 10*3/uL (ref 0.00–0.07)
Basophils Absolute: 0.1 10*3/uL (ref 0.0–0.1)
Basophils Relative: 1 %
Eosinophils Absolute: 0.2 10*3/uL (ref 0.0–0.5)
Eosinophils Relative: 3 %
HCT: 36.5 % — ABNORMAL LOW (ref 39.0–52.0)
Hemoglobin: 11.9 g/dL — ABNORMAL LOW (ref 13.0–17.0)
Immature Granulocytes: 1 %
Lymphocytes Relative: 27 %
Lymphs Abs: 1.7 10*3/uL (ref 0.7–4.0)
MCH: 29.2 pg (ref 26.0–34.0)
MCHC: 32.6 g/dL (ref 30.0–36.0)
MCV: 89.5 fL (ref 80.0–100.0)
Monocytes Absolute: 0.6 10*3/uL (ref 0.1–1.0)
Monocytes Relative: 10 %
Neutro Abs: 3.8 10*3/uL (ref 1.7–7.7)
Neutrophils Relative %: 58 %
Platelets: 237 10*3/uL (ref 150–400)
RBC: 4.08 MIL/uL — ABNORMAL LOW (ref 4.22–5.81)
RDW: 13.6 % (ref 11.5–15.5)
WBC: 6.3 10*3/uL (ref 4.0–10.5)
nRBC: 0 % (ref 0.0–0.2)

## 2021-06-23 LAB — FOLATE: Folate: 35 ng/mL (ref >5.9)

## 2021-06-23 LAB — IRON AND TIBC
Iron: 53 ug/dL (ref 45–182)
Saturation Ratios: 13 % — ABNORMAL LOW (ref 17.9–39.5)
TIBC: 395 ug/dL (ref 250–450)
UIBC: 342 ug/dL

## 2021-06-23 LAB — LACTATE DEHYDROGENASE: LDH: 161 U/L (ref 98–192)

## 2021-06-23 LAB — TECHNOLOGIST SMEAR REVIEW
Plt Morphology: NORMAL
RBC MORPHOLOGY: NORMAL
WBC MORPHOLOGY: NORMAL

## 2021-06-23 LAB — FERRITIN: Ferritin: 154 ng/mL (ref 24–336)

## 2021-06-23 NOTE — Progress Notes (Signed)
Hematology/Oncology Consult note [facility] Telephone:([phone] Fax:([phone]   Patient Care Team: Tonia Ghent, MD as PCP - General (Family Medicine) Minna Merritts, MD as Consulting Physician (Cardiology)  REFERRING PROVIDER: Tonia Ghent, MD  CHIEF COMPLAINTS/REASON FOR VISIT:  Evaluation of anemia  HISTORY OF PRESENTING ILLNESS:   Theodore Estes is a  75 y.o.  male with PMH listed below was seen in consultation at the request of  Tonia Ghent, MD  for evaluation of anemia  06/03/2021 CBC showed hemoglobin 11.4, mcv 88.1, normal wbc, platelt.  Reviewed previous labs.  Anemia onset was since August 2022.  He denies any black or bloody stool. Last colonoscopy was on 01/09/2014.  Sessile polyp measuring 3 mm in size was found in the sigmoid colon.  Polyp was resected. Pathology showed benign colon mucosa with lymphoid aggregates.  Today patient was accompanied by his sister-in-law.  Review of Systems  Constitutional:  Positive for fatigue. Negative for appetite change, chills, fever and unexpected weight change.  HENT:   Negative for hearing loss and voice change.   Eyes:  Negative for eye problems and icterus.  Respiratory:  Negative for chest tightness, cough and shortness of breath.   Cardiovascular:  Negative for chest pain and leg swelling.  Gastrointestinal:  Negative for abdominal distention and abdominal pain.  Endocrine: Negative for hot flashes.  Genitourinary:  Negative for difficulty urinating, dysuria and frequency.   Musculoskeletal:  Negative for arthralgias.  Skin:  Negative for itching and rash.  Neurological:  Negative for light-headedness and numbness.  Hematological:  Negative for adenopathy. Does not bruise/bleed easily.  Psychiatric/Behavioral:  Negative for confusion.    MEDICAL HISTORY:  Past Medical History:  Diagnosis Date   Diabetes mellitus without complication (HCC)    Dyspnea    Erectile  dysfunction    History of colonic polyps    Hyperlipidemia    Hypertension    Hypotestosteronism     SURGICAL HISTORY: Past Surgical History:  Procedure Laterality Date   COLONOSCOPY  08/18/2003   cyst excised from right wrist  2000   VASECTOMY  1977    SOCIAL HISTORY: Social History   Socioeconomic History   Marital status: Married    Spouse name: Not on file   Number of children: Not on file   Years of education: Not on file   Highest education level: Not on file  Occupational History   Not on file  Tobacco Use   Smoking status: Never   Smokeless tobacco: Never  Vaping Use   Vaping Use: Never used  Substance and Sexual Activity   Alcohol use: No    Alcohol/week: 0.0 standard drinks   Drug use: No   Sexual activity: Yes    Partners: Female  Other Topics Concern   Not on file  Social History Narrative   HSG, [employer]   Work: Sports coach   Married '69   2 daughters '76 '77'    5 grandchildren   Marriage in good health   Social Determinants of Health   Financial Resource Strain: Not on file  Food Insecurity: Not on file  Transportation Needs: Not on file  Physical Activity: Not on file  Stress: Not on file  Social Connections: Not on file  Intimate Partner Violence: Not on file    FAMILY HISTORY: Family History  Problem Relation Age of Onset   Cancer Mother        colon   Colon cancer  Mother 51   Heart disease Father    Diabetes Brother    Hypertension Brother    Hyperlipidemia Sister    Prostate cancer Neg Hx     ALLERGIES:  has No Known Allergies.  MEDICATIONS:  Current Outpatient Medications  Medication Sig Dispense Refill   aspirin 81 MG tablet Take 81 mg by mouth daily.     Blood Glucose Monitoring Suppl (ONE TOUCH ULTRA 2) w/Device KIT Check blood sugar twice a day and as directed. Dx. E11.65 1 each 0   lovastatin (MEVACOR) 20 MG tablet Take 1 tablet (20 mg total) by mouth daily. 90 tablet 3   metFORMIN (GLUCOPHAGE) 1000 MG  tablet Take 1 tablet (1,000 mg total) by mouth 2 (two) times daily with a meal. 180 tablet 3   ONETOUCH DELICA LANCETS 10O MISC Check blood sugar twice a day and as directed. Dx. E11.65 100 each 5   ONETOUCH ULTRA test strip USE TO CHECK BLOOD SUGARS TWICE DAILY ASDIRECTED. 100 each 11   minocycline (DYNACIN) 100 MG tablet Take 100 mg by mouth 2 (two) times daily.     pioglitazone (ACTOS) 15 MG tablet Take 1 tablet (15 mg total) by mouth daily. (Patient not taking: No sig reported) 90 tablet 3   No current facility-administered medications for this visit.     PHYSICAL EXAMINATION: ECOG PERFORMANCE STATUS: 0 - Asymptomatic Vitals:   06/23/21 0951  BP: (!) 177/89  Pulse: 78  Resp: 16  Temp: (!) 96.4 F (35.8 C)  SpO2: 100%   Filed Weights   06/23/21 0951  Weight: 195 lb (88.5 kg)    Physical Exam Constitutional:      General: He is not in acute distress.    Appearance: He is obese.  HENT:     Head: Normocephalic and atraumatic.  Eyes:     General: No scleral icterus. Cardiovascular:     Rate and Rhythm: Normal rate and regular rhythm.     Heart sounds: Normal heart sounds.  Pulmonary:     Effort: Pulmonary effort is normal. No respiratory distress.     Breath sounds: No wheezing.  Abdominal:     General: Bowel sounds are normal. There is no distension.     Palpations: Abdomen is soft.  Musculoskeletal:        General: No deformity. Normal range of motion.     Cervical back: Normal range of motion and neck supple.  Skin:    General: Skin is warm and dry.     Findings: No erythema or rash.  Neurological:     Mental Status: He is alert and oriented to person, place, and time. Mental status is at baseline.     Cranial Nerves: No cranial nerve deficit.     Coordination: Coordination normal.  Psychiatric:        Mood and Affect: Mood normal.    LABORATORY DATA:  I have reviewed the data as listed Lab Results  Component Value Date   WBC 6.3 06/23/2021   HGB 11.9  (L) 06/23/2021   HCT 36.5 (L) 06/23/2021   MCV 89.5 06/23/2021   PLT 237 06/23/2021   Recent Labs    08/17/20 0800 08/24/20 1156 04/26/21 0919 05/21/21 1056 06/03/21 1400  NA 141   < > 141 140 140  K 5.1   < > 5.2 No hemolysis seen* 5.0 4.4  CL 103   < > 104 105 103  CO2 29   < > _0 GLUCOSE  142*   < > 139* 112* 177*  BUN 31*   < > 25* 25* 25*  CREATININE 1.61*   < > 1.26 1.23 1.51*  CALCIUM 9.8   < > 9.8 9.5 9.2  PROT 6.9  --  7.1 6.8  --   ALBUMIN 4.4  --  4.2 4.1  --   AST 21  --  20 18  --   ALT 22  --  22 20  --   ALKPHOS 78  --  116 118*  --   BILITOT 0.6  --  0.4 0.4  --    < > = values in this interval not displayed.   Iron/TIBC/Ferritin/ %Sat    Component Value Date/Time   IRON 53 06/23/2021 1048   TIBC 395 06/23/2021 1048   FERRITIN 154 06/23/2021 1048   IRONPCTSAT 13 (L) 06/23/2021 1048      RADIOGRAPHIC STUDIES: I have personally reviewed the radiological images as listed and agreed with the findings in the report. No results found.    ASSESSMENT & PLAN:  1. Iron deficiency anemia, unspecified iron deficiency anemia type    #Previous labs reviewed reviewed and discussed with patient. Iron panel shows borderline iron saturation of 19.  will repeat iron panel today. Rule out other etiologies.  Check LDH, folate, multiple myeloma panel, light chain ratio.  Smear Patient has been referred to reestablish care with gastroenterology.  Possible chronic kidney disease, stage III.  Avoid nephrotoxins.  Encourage oral hydration. Orders Placed This Encounter  Procedures   CBC with Differential/Platelet    Standing Status:   Future    Number of Occurrences:   1    Standing Expiration Date:   06/23/2022   Technologist smear review    Standing Status:   Future    Number of Occurrences:   1    Standing Expiration Date:   06/23/2022   Ferritin    Standing Status:   Future    Number of Occurrences:   1    Standing Expiration Date:   12/21/2021   Iron  and TIBC    Standing Status:   Future    Number of Occurrences:   1    Standing Expiration Date:   06/23/2022   Multiple Myeloma Panel (SPEP&IFE w/QIG)    Standing Status:   Future    Number of Occurrences:   1    Standing Expiration Date:   06/23/2022   Kappa/lambda light chains    Standing Status:   Future    Number of Occurrences:   1    Standing Expiration Date:   06/23/2022   Retic Panel    Standing Status:   Future    Number of Occurrences:   1    Standing Expiration Date:   06/23/2022   Lactate dehydrogenase    Standing Status:   Future    Number of Occurrences:   1    Standing Expiration Date:   06/23/2022   Folate    Standing Status:   Future    Number of Occurrences:   1    Standing Expiration Date:   06/23/2022    All questions were answered. The patient knows to call the clinic with any problems questions or concerns.  cc Tonia Ghent, MD    Return of visit: 2 weeks to go over results. Thank you for this kind referral and the opportunity to participate in the care of this patient. A copy of today's note is  routed to referring provider    Earlie Server, MD, PhD  06/23/2021

## 2021-06-23 NOTE — Progress Notes (Signed)
Pt and sister in law in for visit.  Pt referred by Dr Damita Dunnings for anemia.

## 2021-06-24 LAB — KAPPA/LAMBDA LIGHT CHAINS
Kappa free light chain: 28.5 mg/L — ABNORMAL HIGH (ref 3.3–19.4)
Kappa, lambda light chain ratio: 2.48 — ABNORMAL HIGH (ref 0.26–1.65)
Lambda free light chains: 11.5 mg/L (ref 5.7–26.3)

## 2021-06-28 ENCOUNTER — Telehealth: Payer: Self-pay | Admitting: Internal Medicine

## 2021-06-28 LAB — MULTIPLE MYELOMA PANEL, SERUM
Albumin SerPl Elph-Mcnc: 3.7 g/dL (ref 2.9–4.4)
Albumin/Glob SerPl: 1.3 (ref 0.7–1.7)
Alpha 1: 0.2 g/dL (ref 0.0–0.4)
Alpha2 Glob SerPl Elph-Mcnc: 0.9 g/dL (ref 0.4–1.0)
B-Globulin SerPl Elph-Mcnc: 1.2 g/dL (ref 0.7–1.3)
Gamma Glob SerPl Elph-Mcnc: 0.6 g/dL (ref 0.4–1.8)
Globulin, Total: 2.9 g/dL (ref 2.2–3.9)
IgA: 345 mg/dL (ref 61–437)
IgG (Immunoglobin G), Serum: 724 mg/dL (ref 603–1613)
IgM (Immunoglobulin M), Srm: 77 mg/dL (ref 15–143)
Total Protein ELP: 6.6 g/dL (ref 6.0–8.5)

## 2021-06-28 NOTE — Telephone Encounter (Signed)
Good morning Dr. Carlean Purl,   This pt has a referral to been seen here at [place]. 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 Pt is scheduled to see Dr. Candis Schatz on 07/13/21 @ 9:50am however the Pt is requesting you to be his provider. Please advise on scheduling. Thank you.

## 2021-06-29 NOTE — Telephone Encounter (Signed)
I appreciate the request but ask that he keep appointment with Dr. Candis Schatz

## 2021-06-29 NOTE — Telephone Encounter (Signed)
Contacted pt and pt is aware and will keep the appt with Dr. Candis Schatz. Thank you.

## 2021-07-09 ENCOUNTER — Inpatient Hospital Stay: Payer: PPO | Attending: Oncology | Admitting: Oncology

## 2021-07-09 ENCOUNTER — Telehealth: Payer: Self-pay

## 2021-07-09 ENCOUNTER — Other Ambulatory Visit: Payer: Self-pay

## 2021-07-09 ENCOUNTER — Encounter: Payer: Self-pay | Admitting: Oncology

## 2021-07-09 ENCOUNTER — Inpatient Hospital Stay: Payer: PPO

## 2021-07-09 VITALS — BP 137/80 | HR 90 | Temp 97.2°F | Wt 196.0 lb

## 2021-07-09 DIAGNOSIS — N183 Chronic kidney disease, stage 3 unspecified: Secondary | ICD-10-CM | POA: Diagnosis not present

## 2021-07-09 DIAGNOSIS — Z79899 Other long term (current) drug therapy: Secondary | ICD-10-CM | POA: Diagnosis not present

## 2021-07-09 DIAGNOSIS — D509 Iron deficiency anemia, unspecified: Secondary | ICD-10-CM | POA: Insufficient documentation

## 2021-07-09 DIAGNOSIS — N1831 Chronic kidney disease, stage 3a: Secondary | ICD-10-CM | POA: Diagnosis not present

## 2021-07-09 DIAGNOSIS — Z7982 Long term (current) use of aspirin: Secondary | ICD-10-CM | POA: Insufficient documentation

## 2021-07-09 DIAGNOSIS — R779 Abnormality of plasma protein, unspecified: Secondary | ICD-10-CM | POA: Insufficient documentation

## 2021-07-09 DIAGNOSIS — R768 Other specified abnormal immunological findings in serum: Secondary | ICD-10-CM

## 2021-07-09 DIAGNOSIS — D631 Anemia in chronic kidney disease: Secondary | ICD-10-CM | POA: Insufficient documentation

## 2021-07-09 NOTE — Progress Notes (Signed)
Hematology/Oncology Progress Note [facility] [phone] [phone]   Patient Care Team: Tonia Ghent, MD as PCP - General (Family Medicine) Minna Merritts, MD as Consulting Physician (Cardiology)  REFERRING PROVIDER: Tonia Ghent, MD  CHIEF COMPLAINTS/REASON FOR VISIT:  Follow up with anemia  HISTORY OF PRESENTING ILLNESS:   Theodore Estes is a  75 y.o.  male with PMH listed below was seen in consultation at the request of  Tonia Ghent, MD  for evaluation of anemia  06/03/2021 CBC showed hemoglobin 11.4, mcv 88.1, normal wbc, platelt.  Reviewed previous labs.  Anemia onset was since August 2022.  He denies any black or bloody stool. Last colonoscopy was on 01/09/2014.  Sessile polyp measuring 3 mm in size was found in the sigmoid colon.  Polyp was resected. Pathology showed benign colon mucosa with lymphoid aggregates.  Today patient was accompanied by his sister-in-law.  INTERVAL HISTORY Theodore Estes is a 75 y.o. male who has above history reviewed by me today presents for follow up visit for management of anemia He presents to discuss lab results and management plan. Accompanied by wife. No new complaints.   Review of Systems  Constitutional:  Positive for fatigue. Negative for appetite change, chills, fever and unexpected weight change.  HENT:   Negative for hearing loss and voice change.   Eyes:  Negative for eye problems and icterus.  Respiratory:  Negative for chest tightness, cough and shortness of breath.   Cardiovascular:  Negative for chest pain and leg swelling.  Gastrointestinal:  Negative for abdominal distention and abdominal pain.  Endocrine: Negative for hot flashes.  Genitourinary:  Negative for difficulty urinating, dysuria and frequency.   Musculoskeletal:  Negative for arthralgias.  Skin:  Negative for itching and rash.  Neurological:  Negative for light-headedness and numbness.   Hematological:  Negative for adenopathy. Does not bruise/bleed easily.  Psychiatric/Behavioral:  Negative for confusion.    MEDICAL HISTORY:  Past Medical History:  Diagnosis Date   Diabetes mellitus without complication (HCC)    Dyspnea    Erectile dysfunction    History of colonic polyps    Hyperlipidemia    Hypertension    Hypotestosteronism     SURGICAL HISTORY: Past Surgical History:  Procedure Laterality Date   COLONOSCOPY  08/18/2003   cyst excised from right wrist  2000   VASECTOMY  1977    SOCIAL HISTORY: Social History   Socioeconomic History   Marital status: Married    Spouse name: Not on file   Number of children: Not on file   Years of education: Not on file   Highest education level: Not on file  Occupational History   Not on file  Tobacco Use   Smoking status: Never   Smokeless tobacco: Never  Vaping Use   Vaping Use: Never used  Substance and Sexual Activity   Alcohol use: No    Alcohol/week: 0.0 standard drinks   Drug use: No   Sexual activity: Yes    Partners: Female  Other Topics Concern   Not on file  Social History Narrative   HSG, [employer]   Work: Sports coach   Married '69   2 daughters '76 '77'    5 grandchildren   Marriage in good health   Social Determinants of Health   Financial Resource Strain: Not on file  Food Insecurity: Not on file  Transportation Needs: Not on file  Physical Activity: Not on file  Stress:  Not on file  Social Connections: Not on file  Intimate Partner Violence: Not on file    FAMILY HISTORY: Family History  Problem Relation Age of Onset   Cancer Mother        colon   Colon cancer Mother 29   Heart disease Father    Diabetes Brother    Hypertension Brother    Hyperlipidemia Sister    Prostate cancer Neg Hx     ALLERGIES:  has No Known Allergies.  MEDICATIONS:  Current Outpatient Medications  Medication Sig Dispense Refill   aspirin 81 MG tablet Take 81 mg by mouth daily.      Blood Glucose Monitoring Suppl (ONE TOUCH ULTRA 2) w/Device KIT Check blood sugar twice a day and as directed. Dx. E11.65 1 each 0   lovastatin (MEVACOR) 20 MG tablet Take 1 tablet (20 mg total) by mouth daily. 90 tablet 3   metFORMIN (GLUCOPHAGE) 1000 MG tablet Take 1 tablet (1,000 mg total) by mouth 2 (two) times daily with a meal. 180 tablet 3   minocycline (DYNACIN) 100 MG tablet Take 100 mg by mouth 2 (two) times daily.     ONETOUCH DELICA LANCETS 41O MISC Check blood sugar twice a day and as directed. Dx. E11.65 100 each 5   ONETOUCH ULTRA test strip USE TO CHECK BLOOD SUGARS TWICE DAILY ASDIRECTED. 100 each 11   pioglitazone (ACTOS) 15 MG tablet Take 1 tablet (15 mg total) by mouth daily. (Patient not taking: No sig reported) 90 tablet 3   No current facility-administered medications for this visit.     PHYSICAL EXAMINATION: ECOG PERFORMANCE STATUS: 0 - Asymptomatic Vitals:   07/09/21 1214  BP: 137/80  Pulse: 90  Temp: (!) 97.2 F (36.2 C)  SpO2: 100%   Filed Weights   07/09/21 1214  Weight: 196 lb (88.9 kg)    Physical Exam Constitutional:      General: He is not in acute distress.    Appearance: He is obese.  HENT:     Head: Normocephalic and atraumatic.  Eyes:     General: No scleral icterus. Cardiovascular:     Rate and Rhythm: Normal rate and regular rhythm.     Heart sounds: Normal heart sounds.  Pulmonary:     Effort: Pulmonary effort is normal. No respiratory distress.     Breath sounds: No wheezing.  Abdominal:     General: Bowel sounds are normal. There is no distension.     Palpations: Abdomen is soft.  Musculoskeletal:        General: No deformity. Normal range of motion.     Cervical back: Normal range of motion and neck supple.  Skin:    General: Skin is warm and dry.     Findings: No erythema or rash.  Neurological:     Mental Status: He is alert and oriented to person, place, and time. Mental status is at baseline.     Cranial Nerves: No  cranial nerve deficit.     Coordination: Coordination normal.  Psychiatric:        Mood and Affect: Mood normal.    LABORATORY DATA:  I have reviewed the data as listed Lab Results  Component Value Date   WBC 6.3 06/23/2021   HGB 11.9 (L) 06/23/2021   HCT 36.5 (L) 06/23/2021   MCV 89.5 06/23/2021   PLT 237 06/23/2021   Recent Labs    08/17/20 0800 08/24/20 1156 04/26/21 0919 05/21/21 1056 06/03/21 1400  NA 141   < >  141 140 140  K 5.1   < > 5.2 No hemolysis seen* 5.0 4.4  CL 103   < > 104 105 103  CO2 29   < > 28 27 28   GLUCOSE 142*   < > 139* 112* 177*  BUN 31*   < > 25* 25* 25*  CREATININE 1.61*   < > 1.26 1.23 1.51*  CALCIUM 9.8   < > 9.8 9.5 9.2  PROT 6.9  --  7.1 6.8  --   ALBUMIN 4.4  --  4.2 4.1  --   AST 21  --  20 18  --   ALT 22  --  22 20  --   ALKPHOS 78  --  116 118*  --   BILITOT 0.6  --  0.4 0.4  --    < > = values in this interval not displayed.    Iron/TIBC/Ferritin/ %Sat    Component Value Date/Time   IRON 53 06/23/2021 1048   TIBC 395 06/23/2021 1048   FERRITIN 154 06/23/2021 1048   IRONPCTSAT 13 (L) 06/23/2021 1048      RADIOGRAPHIC STUDIES: I have personally reviewed the radiological images as listed and agreed with the findings in the report. No results found.    ASSESSMENT & PLAN:  1. Iron deficiency anemia, unspecified iron deficiency anemia type   2. Stage 3a chronic kidney disease (HCC)   3. Elevated serum immunoglobulin free light chains    #Anemia, iron deficiency and may also have a component of anemia due to CKD.  Labs are reviewed and discussed with patient. Decreased iron saturation.  Discussed about option of oral iron supplementation vs IV venofer.  Patient agrees with IV venofer.  Plan IV iron with Venofer 237m weekly x 1 dose. Allergy reactions/infusion reaction including anaphylactic reaction discussed with patient. Other side effects include but not limited to high blood pressure, skin rash, weight gain, leg  swelling, etc. Patient voices understanding and willing to proceed. Etiology of IDA is unknown, recommend GI evaluation.   # Chronic kidney disease,stage 3 multiple myeloma panel showed negative M protein. Increased light chain ratio. Check 24 hours urine protein electrophoresis and immunofixation.   Follow up in 3 months.  Orders Placed This Encounter  Procedures   IFE+PROTEIN ELECTRO, 24-HR UR    Standing Status:   Future    Standing Expiration Date:   07/09/2022   Iron and TIBC    Standing Status:   Future    Standing Expiration Date:   07/09/2022   Ferritin    Standing Status:   Future    Standing Expiration Date:   01/07/2022   CBC with Differential/Platelet    Standing Status:   Future    Standing Expiration Date:   07/09/2022    All questions were answered. The patient knows to call the clinic with any problems questions or concerns.  cc DTonia Ghent MD   Thank you for this kind referral and the opportunity to participate in the care of this patient. A copy of today's note is routed to referring provider    ZEarlie Server MD, PhD  07/09/2021

## 2021-07-09 NOTE — Telephone Encounter (Signed)
Called and spoke to patient about upcoming appt, patient states he never had a sleep study before.

## 2021-07-11 DIAGNOSIS — D509 Iron deficiency anemia, unspecified: Secondary | ICD-10-CM | POA: Diagnosis not present

## 2021-07-12 ENCOUNTER — Encounter: Payer: Self-pay | Admitting: Primary Care

## 2021-07-12 ENCOUNTER — Other Ambulatory Visit: Payer: Self-pay

## 2021-07-12 ENCOUNTER — Ambulatory Visit: Payer: PPO | Admitting: Primary Care

## 2021-07-12 VITALS — BP 126/80 | HR 94 | Temp 97.7°F | Ht 66.0 in | Wt 199.8 lb

## 2021-07-12 DIAGNOSIS — R0683 Snoring: Secondary | ICD-10-CM

## 2021-07-12 DIAGNOSIS — D509 Iron deficiency anemia, unspecified: Secondary | ICD-10-CM

## 2021-07-12 DIAGNOSIS — R768 Other specified abnormal immunological findings in serum: Secondary | ICD-10-CM

## 2021-07-12 DIAGNOSIS — N1831 Chronic kidney disease, stage 3a: Secondary | ICD-10-CM

## 2021-07-12 NOTE — Progress Notes (Signed)
_0  ID: Theodore Estes, male    DOB: 1946-01-29, 75 y.o.   MRN: [id number]  No chief complaint on file.   Referring provider: Michela Pitcher, NP  HPI: 75 year old male, never smoked.  Past medical history significant for hypertension, diabetes, iron deficiency anemia.  07/12/2021- Interim hx  Patient presents today for sleep consult.  He was referred by family medicine due to symptoms of snoring and fatigue. He does carry diagnosis of anemia and following with heme/onc. He is scheduled for iron transfusion Wednesday 07/14/21. No previous sleep studies. Typical bedtime is 10-11pm. He gets 6-7 hours of sleep a night, he wakes up frequently throughout the night. On average, he gets out of bed 4 times to use the restroom. He will wake up at 5am eat breakfast and go back to bed for another 30-41mns. It's hard for him to stay awake while driving. Denies sleep walking, narcolepsy or cataplexy.   Sleep questionnaire  Symptoms- Snoring, restless sleep, daytime sleepiness Prior sleep study- None  Bedtime-10-11 Time to fall asleep- <10 mins  Nocturnal awakenings- >5-10 times Out of bed in morning- 5am and 6:30am  Weight changes- No Epworth- 17/24   No Known Allergies  Immunization History  Administered Date(s) Administered   Fluad Quad(high Dose 65+) 06/06/2019   Influenza Inj Mdck Quad Pf 08/19/2017   Influenza Whole 06/26/2008, 06/26/2009, 10/19/2011   Influenza, Seasonal, Injecte, Preservative Fre 07/05/2016   Influenza,inj,Quad PF,6+ Mos 07/04/2014, 08/19/2015   Influenza-Unspecified 07/27/2013, 09/03/2018, 06/26/2020   Moderna Sars-Covid-2 Vaccination 11/01/2019, 11/27/2019, 07/27/2020   Pneumococcal Conjugate-13 02/11/2015   Pneumococcal Polysaccharide-23 01/27/2012   Tdap 01/27/2012   Zoster Recombinat (Shingrix) 09/03/2018, 12/17/2018   Zoster, Live 06/26/2008    Past Medical History:  Diagnosis Date   Diabetes mellitus without complication ([place]    Dyspnea     Erectile dysfunction    History of colonic polyps    Hyperlipidemia    Hypertension    Hypotestosteronism     Tobacco History: Social History   Tobacco Use  Smoking Status Never  Smokeless Tobacco Never   Counseling given: Not Answered   Outpatient Medications Prior to Visit  Medication Sig Dispense Refill   aspirin 81 MG tablet Take 81 mg by mouth daily.     Blood Glucose Monitoring Suppl (ONE TOUCH ULTRA 2) w/Device KIT Check blood sugar twice a day and as directed. Dx. E11.65 1 each 0   lovastatin (MEVACOR) 20 MG tablet Take 1 tablet (20 mg total) by mouth daily. 90 tablet 3   metFORMIN (GLUCOPHAGE) 1000 MG tablet Take 1 tablet (1,000 mg total) by mouth 2 (two) times daily with a meal. 180 tablet 3   minocycline (DYNACIN) 100 MG tablet Take 100 mg by mouth 2 (two) times daily.     ONETOUCH DELICA LANCETS 387GMISC Check blood sugar twice a day and as directed. Dx. E11.65 100 each 5   ONETOUCH ULTRA test strip USE TO CHECK BLOOD SUGARS TWICE DAILY ASDIRECTED. 100 each 11   pioglitazone (ACTOS) 15 MG tablet Take 1 tablet (15 mg total) by mouth daily. 90 tablet 3   No facility-administered medications prior to visit.    Review of Systems  Review of Systems  Constitutional:  Positive for fatigue.  HENT: Negative.    Respiratory: Negative.    Psychiatric/Behavioral:  Positive for sleep disturbance.     Physical Exam  BP 126/80 (BP Location: Left Arm, Patient Position: Sitting, Cuff Size: Normal)   Pulse 94   Temp 97.7  F (36.5 C) (Oral)   Ht _0  (1.676 m)   Wt 199 lb 12.8 oz (90.6 kg)   SpO2 98%   BMI 32.25 kg/m  Physical Exam Constitutional:      Appearance: Normal appearance.  HENT:     Head: Normocephalic.  Cardiovascular:     Rate and Rhythm: Normal rate and regular rhythm.  Pulmonary:     Effort: Pulmonary effort is normal.     Breath sounds: Normal breath sounds.  Neurological:     General: No focal deficit present.     Mental Status: He is alert  and oriented to person, place, and time. Mental status is at baseline.  Psychiatric:        Mood and Affect: Mood normal.        Behavior: Behavior normal.        Thought Content: Thought content normal.        Judgment: Judgment normal.     Lab Results:  CBC    Component Value Date/Time   WBC 6.3 06/23/2021 1048   RBC 4.09 (L) 06/23/2021 1048   RBC 4.08 (L) 06/23/2021 1048   HGB 11.9 (L) 06/23/2021 1048   HCT 36.5 (L) 06/23/2021 1048   PLT 237 06/23/2021 1048   MCV 89.5 06/23/2021 1048   MCH 29.2 06/23/2021 1048   MCHC 32.6 06/23/2021 1048   RDW 13.6 06/23/2021 1048   LYMPHSABS 1.7 06/23/2021 1048   MONOABS 0.6 06/23/2021 1048   EOSABS 0.2 06/23/2021 1048   BASOSABS 0.1 06/23/2021 1048    BMET    Component Value Date/Time   NA 140 06/03/2021 1400   K 4.4 06/03/2021 1400   CL 103 06/03/2021 1400   CO2 28 06/03/2021 1400   GLUCOSE 177 (H) 06/03/2021 1400   BUN 25 (H) 06/03/2021 1400   CREATININE 1.51 (H) 06/03/2021 1400   CALCIUM 9.2 06/03/2021 1400   GFRNONAA 71.83 10/19/2009 0859   GFRAA 88 03/25/2008 0955    BNP No results found for: BNP  ProBNP    Component Value Date/Time   PROBNP 31.0 04/26/2021 0919    Imaging: No results found.   Assessment & Plan:   Snoring - Patient has symptoms of snoring, disruptive sleep and daytime fatigue. No previous sleep study. Epworth 17. Moderate suspicion patient may have underlying OSA, needs home sleep test to evaluate. We reviewed risk of untreated sleep apnea including cardiac arrhthymias, pulmonary HTN, stroke and diabetes. We also briefly discuss treatment options. Advised patient not to drive if experiencing excessive daytime sleepiness. Encourage weight loss efforts. FU in 4-6 week to review sleep study results and review treatment options further.    Martyn Ehrich, NP 07/12/2021

## 2021-07-12 NOTE — Patient Instructions (Signed)
Recommend checking sleep study to evaluate for obstructive sleep apnea d/t symptoms   Sleep apnea puts you at high risk for cardiac arrhythmias including A. fib, pulmonary hypertension, stroke and diabetes  Treatment options include weight loss, oral appliance, CPAP therapy or referral to ENT for possible surgical options  Follow-up 4 to 6 weeks virtual video visit or in office visit with [facility] NP to review sleep study results    Sleep Apnea Sleep apnea affects breathing during sleep. It causes breathing to stop for 10 seconds or more, or to become shallow. People with sleep apnea usually snore loudly. It can also increase the risk of: Heart attack. Stroke. Being very overweight (obese). Diabetes. Heart failure. Irregular heartbeat. High blood pressure. The goal of treatment is to help you breathe normally again. What are the causes? The most common cause of this condition is a collapsed or blocked airway. There are three kinds of sleep apnea: Obstructive sleep apnea. This is caused by a blocked or collapsed airway. Central sleep apnea. This happens when the brain does not send the right signals to the muscles that control breathing. Mixed sleep apnea. This is a combination of obstructive and central sleep apnea. What increases the risk? Being overweight. Smoking. Having a small airway. Being older. Being male. Drinking alcohol. Taking medicines to calm yourself (sedatives or tranquilizers). Having family members with the condition. Having a tongue or tonsils that are larger than normal. What are the signs or symptoms? Trouble staying asleep. Loud snoring. Headaches in the morning. Waking up gasping. Dry mouth or sore throat in the morning. Being sleepy or tired during the day. If you are sleepy or tired during the day, you may also: Not be able to focus your mind (concentrate). Forget things. Get angry a lot and have mood swings. Feel sad (depressed). Have  changes in your personality. Have less interest in sex, if you are male. Be unable to have an erection, if you are male. How is this treated?  Sleeping on your side. Using a medicine to get rid of mucus in your nose (decongestant). Avoiding the use of alcohol, medicines to help you relax, or certain pain medicines (narcotics). Losing weight, if needed. Changing your diet. Quitting smoking. Using a machine to open your airway while you sleep, such as: An oral appliance. This is a mouthpiece that shifts your lower jaw forward. A CPAP device. This device blows air through a mask when you breathe out (exhale). An EPAP device. This has valves that you put in each nostril. A BPAP device. This device blows air through a mask when you breathe in (inhale) and breathe out. Having surgery if other treatments do not work. Follow these instructions at home: Lifestyle Make changes that your doctor recommends. Eat a healthy diet. Lose weight if needed. Avoid alcohol, medicines to help you relax, and some pain medicines. Do not smoke or use any products that contain nicotine or tobacco. If you need help quitting, ask your doctor. General instructions Take over-the-counter and prescription medicines only as told by your doctor. If you were given a machine to use while you sleep, use it only as told by your doctor. If you are having surgery, make sure to tell your doctor you have sleep apnea. You may need to bring your device with you. Keep all follow-up visits. Contact a doctor if: The machine that you were given to use during sleep bothers you or does not seem to be working. You do not get better.  You get worse. Get help right away if: Your chest hurts. You have trouble breathing in enough air. You have an uncomfortable feeling in your back, arms, or stomach. You have trouble talking. One side of your body feels weak. A part of your face is hanging down. These symptoms may be an emergency.  Get help right away. Call your local emergency services (911 in the [place] Do not wait to see if the symptoms will go away. Do not drive yourself to the hospital. Summary This condition affects breathing during sleep. The most common cause is a collapsed or blocked airway. The goal of treatment is to help you breathe normally while you sleep. This information is not intended to replace advice given to you by your health care provider. Make sure you discuss any questions you have with your health care provider. Document Revised: 08/21/2020 Document Reviewed: 08/21/2020 Elsevier Patient Education  2022 [employer].

## 2021-07-12 NOTE — Assessment & Plan Note (Addendum)
-   Patient has symptoms of snoring, disruptive sleep and daytime fatigue. No previous sleep study. Epworth 17. Moderate suspicion patient may have underlying OSA, needs home sleep test to evaluate. We reviewed risk of untreated sleep apnea including cardiac arrhthymias, pulmonary HTN, stroke and diabetes. We also briefly discuss treatment options. Advised patient not to drive if experiencing excessive daytime sleepiness. Encourage weight loss efforts. FU in 4-6 week to review sleep study results and review treatment options further.

## 2021-07-13 ENCOUNTER — Encounter: Payer: Self-pay | Admitting: Gastroenterology

## 2021-07-13 ENCOUNTER — Ambulatory Visit (INDEPENDENT_AMBULATORY_CARE_PROVIDER_SITE_OTHER): Payer: PPO | Admitting: Gastroenterology

## 2021-07-13 VITALS — BP 142/78 | HR 88 | Ht 66.0 in | Wt 200.5 lb

## 2021-07-13 DIAGNOSIS — D508 Other iron deficiency anemias: Secondary | ICD-10-CM

## 2021-07-13 LAB — IFE+PROTEIN ELECTRO, 24-HR UR
% BETA, Urine: 18.6 %
ALPHA 1 URINE: 4 %
Albumin, U: 61.3 %
Alpha 2, Urine: 8 %
GAMMA GLOBULIN URINE: 8.1 %
Total Protein, Urine-Ur/day: 159 mg/24 hr — ABNORMAL HIGH (ref 30–150)
Total Protein, Urine: 17.7 mg/dL
Total Volume: 900

## 2021-07-13 MED ORDER — NA SULFATE-K SULFATE-MG SULF 17.5-3.13-1.6 GM/177ML PO SOLN: 1.0000 | Freq: Once | ORAL | 0 refills | Status: AC

## 2021-07-13 NOTE — Progress Notes (Signed)
HPI : Theodore Estes is a very pleasant 75 year old male with a history of well-controlled diabetes, hypertension and hyperlipidemia who was referred to [place] by Dr. Elsie Stain for further evaluation of iron deficiency anemia.  The patient's baseline hemoglobin had been 14-15 through 2021.  As of August of this year, his hemoglobin has been between 48 and 12.  His MCV has remained in the upper 80s.  His RDW has remained stable at around 13-14.  A recent iron panel was equivocal for iron deficiency, with a ferritin of 154, serum iron 53 but slightly low iron saturation at 13%.  His TIBC was not elevated.  The patient denies any GI symptoms.  Specifically, he denies problems with abdominal pain, constipation, diarrhea.  No melena or hematochezia.  He denies any upper GI symptoms such as acid regurgitation, heartburn, dysphagia or nausea/vomiting.  Not take any acid suppressive medications.  He reports a good appetite and stable weight. The patient's last colonoscopy was in 2015 and was negative for any precancerous polyps.  The patient does have mild chronic kidney disease which has been stable. He was recently seen by hematology and was scheduled for an IV iron infusion (to be completed tomorrow). His mother was diagnosed with colon cancer in her 62s.  Otherwise, no family history of colon cancer. Does report symptoms of anemia to include decreased exercise tolerance and increased fatigue.  He denies symptoms of chest pain/pressure, lightheadedness/dizziness, or dyspnea at rest.  He has no known cardiopulmonary comorbidities.  He is being evaluated for obstructive sleep apnea.   Past Medical History:  Diagnosis Date   Diabetes mellitus without complication (HCC)    Dyspnea    Erectile dysfunction    History of colonic polyps    Hyperlipidemia    Hypertension    Hypotestosteronism      Past Surgical History:  Procedure Laterality Date   COLONOSCOPY  08/18/2003   cyst excised from  right wrist  2000   VASECTOMY  1977   Family History  Problem Relation Age of Onset   Cancer Mother        colon   Colon cancer Mother 36   Heart disease Father    Diabetes Brother    Hypertension Brother    Hyperlipidemia Sister    Prostate cancer Neg Hx    Social History   Tobacco Use   Smoking status: Never   Smokeless tobacco: Never  Vaping Use   Vaping Use: Never used  Substance Use Topics   Alcohol use: No    Alcohol/week: 0.0 standard drinks   Drug use: No   Current Outpatient Medications  Medication Sig Dispense Refill   aspirin 81 MG tablet Take 81 mg by mouth daily.     Blood Glucose Monitoring Suppl (ONE TOUCH ULTRA 2) w/Device KIT Check blood sugar twice a day and as directed. Dx. E11.65 1 each 0   lovastatin (MEVACOR) 20 MG tablet Take 1 tablet (20 mg total) by mouth daily. 90 tablet 3   metFORMIN (GLUCOPHAGE) 1000 MG tablet Take 1 tablet (1,000 mg total) by mouth 2 (two) times daily with a meal. 180 tablet 3   minocycline (DYNACIN) 100 MG tablet Take 100 mg by mouth 2 (two) times daily.     ONETOUCH DELICA LANCETS 33A MISC Check blood sugar twice a day and as directed. Dx. E11.65 100 each 5   ONETOUCH ULTRA test strip USE TO CHECK BLOOD SUGARS TWICE DAILY ASDIRECTED. 100 each  11   pioglitazone (ACTOS) 15 MG tablet Take 1 tablet (15 mg total) by mouth daily. 90 tablet 3   No current facility-administered medications for this visit.   No Known Allergies   Review of Systems: All systems reviewed and negative except where noted in HPI.    No results found.  Physical Exam: BP (!) 142/78   Pulse 88   Ht _0  (1.676 m)   Wt 200 lb 8 oz (90.9 kg)   BMI 32.36 kg/m  Constitutional: Pleasant,well-developed, Caucasian male in no acute distress.  Accompanied by male partner HEENT: Normocephalic and atraumatic. Conjunctivae are normal. No scleral icterus. Cardiovascular: Normal rate, regular rhythm.  Pulmonary/chest: Effort normal and breath sounds normal.  No wheezing, rales or rhonchi. Abdominal: Soft, nondistended, nontender. Bowel sounds active throughout. There are no masses palpable. No hepatomegaly. Extremities: no edema Lymphadenopathy: No cervical adenopathy noted. Neurological: Alert and oriented to person place and time. Skin: Skin is warm and dry. No rashes noted. Psychiatric: Normal mood and affect. Behavior is normal.  CBC    Component Value Date/Time   WBC 6.3 06/23/2021 1048   RBC 4.09 (L) 06/23/2021 1048   RBC 4.08 (L) 06/23/2021 1048   HGB 11.9 (L) 06/23/2021 1048   HCT 36.5 (L) 06/23/2021 1048   PLT 237 06/23/2021 1048   MCV 89.5 06/23/2021 1048   MCH 29.2 06/23/2021 1048   MCHC 32.6 06/23/2021 1048   RDW 13.6 06/23/2021 1048   LYMPHSABS 1.7 06/23/2021 1048   MONOABS 0.6 06/23/2021 1048   EOSABS 0.2 06/23/2021 1048   BASOSABS 0.1 06/23/2021 1048    CMP     Component Value Date/Time   NA 140 06/03/2021 1400   K 4.4 06/03/2021 1400   CL 103 06/03/2021 1400   CO2 28 06/03/2021 1400   GLUCOSE 177 (H) 06/03/2021 1400   BUN 25 (H) 06/03/2021 1400   CREATININE 1.51 (H) 06/03/2021 1400   CALCIUM 9.2 06/03/2021 1400   PROT 6.8 05/21/2021 1056   ALBUMIN 4.1 05/21/2021 1056   AST 18 05/21/2021 1056   ALT 20 05/21/2021 1056   ALKPHOS 118 (H) 05/21/2021 1056   BILITOT 0.4 05/21/2021 1056   GFRNONAA 71.83 10/19/2009 0859   GFRAA 88 03/25/2008 0955   Results for LEONIDAS, BOATENG (MRN [id number]) as of 07/13/2021 09:33  Ref. Range 05/21/2021 10:56 06/03/2021 14:00 06/23/2021 10:48  Iron Latest Ref Range: 45 - 182 ug/dL 78 69 53  UIBC Latest Units: ug/dL   342  TIBC Latest Ref Range: 250 - 450 ug/dL 393.4  395  Saturation Ratios Latest Ref Range: 17.9 - 39.5 % 19.8 (L)  13 (L)  Ferritin Latest Ref Range: 24 - 336 ng/mL 181.7  154  Transferrin Latest Ref Range: 212.0 - 360.0 mg/dL 281.0    Folate Latest Ref Range: >5.9 ng/mL   35.0   Results for DEMONTRE, PADIN (MRN [id number]) as of 07/13/2021 13:39  Ref. Range  05/13/2019 08:00 08/17/2020 08:00 04/26/2021 09:19 05/21/2021 10:56 06/03/2021 14:00 06/23/2021 10:48  Hemoglobin Latest Ref Range: 13.0 - 17.0 g/dL 14.4 15.2 12.2 (L) 11.7 (L) 11.4 (L) 11.9 (L)     ASSESSMENT AND PLAN: 75 year old male with diabetes and chronic kidney disease, with 2-3 point drop in hemoglobin over the past year.  His MCV and RDW have been normal and not changed during this time.  His iron panel was not entirely consistent with iron deficiency (ferritin greater than 100, normal serum iron, normal TIBC).  Only a low iron  saturation was suggestive of iron deficiency.  I think it is unlikely that his drop in hemoglobin is completely due to iron deficiency.  I suspect his renal insufficiency may also be contributing.  However, given his equivocal iron panel and unexplained drop in hemoglobin, I think an upper and lower endoscopy is reasonable.  We will plan for gastric biopsies to evaluate for H. pylori at the time of his upper endoscopy.   Anemia, low iron - EGD/Colo -Follow-up with hematology for ongoing evaluation of anemia  The details, risks (including bleeding, perforation, infection, missed lesions, medication reactions and possible hospitalization or surgery if complications occur), benefits, and alternatives to EGD/colonoscopy with possible biopsy and possible polypectomy were discussed with the patient and he consents to proceed.   Kendrix Orman E. Candis Schatz, MD [facility]   CC:  Tonia Ghent, MD

## 2021-07-13 NOTE — Progress Notes (Signed)
Reviewed and agree with assessment/plan.   Chesley Mires, MD [facility] Pulmonary/Critical Care 07/13/2021, 8:48 AM Pager:  [phone]

## 2021-07-13 NOTE — Patient Instructions (Signed)
If you are age 75 or older, your body mass index should be between 23-30. Your Body mass index is 32.36 kg/m. If this is out of the aforementioned range listed, please consider follow up with your Primary Care Provider.  If you are age 37 or younger, your body mass index should be between 19-25. Your Body mass index is 32.36 kg/m. If this is out of the aformentioned range listed, please consider follow up with your Primary Care Provider.   You have been scheduled for an endoscopy and colonoscopy. Please follow the written instructions given to you at your visit today. Please pick up your prep supplies at the pharmacy within the next 1-3 days. If you use inhalers (even only as needed), please bring them with you on the day of your procedure.  The [facility] providers would like to encourage you to use [facility] to communicate with providers for non-urgent requests or questions.  Due to long hold times on the telephone, sending your provider a message by [facility] may be a faster and more efficient way to get a response.  Please allow 48 business hours for a response.  Please remember that this is for non-urgent requests.   Due to recent changes in healthcare laws, you may see the results of your imaging and laboratory studies on MyChart before your provider has had a chance to review them.  We understand that in some cases there may be results that are confusing or concerning to you. Not all laboratory results come back in the same time frame and the provider may be waiting for multiple results in order to interpret others.  Please give [place] 48 hours in order for your provider to thoroughly review all the results before contacting the office for clarification of your results.   It was a pleasure to see you today!  Thank you for trusting me with your gastrointestinal care!    Scott E. Candis Schatz, MD

## 2021-07-14 ENCOUNTER — Other Ambulatory Visit: Payer: Self-pay

## 2021-07-14 ENCOUNTER — Inpatient Hospital Stay: Payer: PPO

## 2021-07-14 VITALS — BP 148/86 | HR 81 | Temp 97.9°F | Resp 20

## 2021-07-14 DIAGNOSIS — D509 Iron deficiency anemia, unspecified: Secondary | ICD-10-CM | POA: Diagnosis not present

## 2021-07-14 DIAGNOSIS — D508 Other iron deficiency anemias: Secondary | ICD-10-CM

## 2021-07-14 MED ORDER — IRON SUCROSE 20 MG/ML IV SOLN
200.0000 mg | Freq: Once | INTRAVENOUS | Status: AC
  Administered 2021-07-14: 200 mg via INTRAVENOUS
  Filled 2021-07-14: qty 10

## 2021-07-14 MED ORDER — SODIUM CHLORIDE 0.9 % IV SOLN
Freq: Once | INTRAVENOUS | Status: AC
  Filled 2021-07-14: qty 250

## 2021-07-14 MED ORDER — SODIUM CHLORIDE 0.9 % IV SOLN: 200.0000 mg | Freq: Once | INTRAVENOUS | Status: DC

## 2021-07-14 NOTE — Patient Instructions (Signed)

## 2021-08-10 ENCOUNTER — Encounter: Payer: Self-pay | Admitting: Family Medicine

## 2021-08-10 ENCOUNTER — Telehealth: Payer: Self-pay | Admitting: Family Medicine

## 2021-08-10 NOTE — Telephone Encounter (Signed)
LVM for pt to rtn my call to schedule AWV with [facility]. Please schedule if pt call the office.

## 2021-08-10 NOTE — Telephone Encounter (Signed)
Spoke to patient by telephone and was advised that he started with symptoms Sunday. Patient stated that he is sure that he has the flu because one of his co-workers came down with it 2-3 days before he got sick. Patient stated that he has a deep cough in his chest. Patient stated that he thinks that he may be feeling a little better this morning. Patient denies wheezing, SOB or difficulty. Patient was offered a virtual visit which he declines and requested that this message go back to Dr. Damita Dunnings for him to review. Patient was given ER precautions and he verbalized understanding. Pharmacy [place]

## 2021-08-10 NOTE — Telephone Encounter (Signed)
Please offer VV

## 2021-08-24 ENCOUNTER — Encounter: Payer: Self-pay | Admitting: Oncology

## 2021-08-24 ENCOUNTER — Other Ambulatory Visit: Payer: Self-pay

## 2021-08-24 ENCOUNTER — Ambulatory Visit: Payer: PPO

## 2021-08-24 DIAGNOSIS — R0683 Snoring: Secondary | ICD-10-CM

## 2021-08-24 DIAGNOSIS — G4733 Obstructive sleep apnea (adult) (pediatric): Secondary | ICD-10-CM | POA: Diagnosis not present

## 2021-08-25 DIAGNOSIS — G4733 Obstructive sleep apnea (adult) (pediatric): Secondary | ICD-10-CM | POA: Diagnosis not present

## 2021-08-27 ENCOUNTER — Other Ambulatory Visit: Payer: Self-pay

## 2021-08-27 ENCOUNTER — Encounter: Payer: Self-pay | Admitting: Gastroenterology

## 2021-08-27 ENCOUNTER — Ambulatory Visit (AMBULATORY_SURGERY_CENTER): Payer: PPO | Admitting: Gastroenterology

## 2021-08-27 VITALS — BP 143/71 | HR 67 | Temp 98.0°F | Resp 12 | Ht 66.0 in | Wt 200.0 lb

## 2021-08-27 DIAGNOSIS — K5730 Diverticulosis of large intestine without perforation or abscess without bleeding: Secondary | ICD-10-CM

## 2021-08-27 DIAGNOSIS — D508 Other iron deficiency anemias: Secondary | ICD-10-CM | POA: Diagnosis not present

## 2021-08-27 DIAGNOSIS — D123 Benign neoplasm of transverse colon: Secondary | ICD-10-CM

## 2021-08-27 DIAGNOSIS — K319 Disease of stomach and duodenum, unspecified: Secondary | ICD-10-CM

## 2021-08-27 DIAGNOSIS — K2100 Gastro-esophageal reflux disease with esophagitis, without bleeding: Secondary | ICD-10-CM

## 2021-08-27 DIAGNOSIS — E119 Type 2 diabetes mellitus without complications: Secondary | ICD-10-CM | POA: Diagnosis not present

## 2021-08-27 DIAGNOSIS — D124 Benign neoplasm of descending colon: Secondary | ICD-10-CM

## 2021-08-27 DIAGNOSIS — K449 Diaphragmatic hernia without obstruction or gangrene: Secondary | ICD-10-CM | POA: Diagnosis not present

## 2021-08-27 DIAGNOSIS — K259 Gastric ulcer, unspecified as acute or chronic, without hemorrhage or perforation: Secondary | ICD-10-CM

## 2021-08-27 DIAGNOSIS — K648 Other hemorrhoids: Secondary | ICD-10-CM

## 2021-08-27 DIAGNOSIS — D509 Iron deficiency anemia, unspecified: Secondary | ICD-10-CM | POA: Diagnosis not present

## 2021-08-27 MED ORDER — OMEPRAZOLE 20 MG PO CPDR: 20.0000 mg | DELAYED_RELEASE_CAPSULE | Freq: Two times a day (BID) | ORAL | 1 refills | Status: DC

## 2021-08-27 MED ORDER — SODIUM CHLORIDE 0.9 % IV SOLN: 500.0000 mL | Freq: Once | INTRAVENOUS | Status: DC

## 2021-08-27 NOTE — Patient Instructions (Addendum)
YOU HAD AN ENDOSCOPIC PROCEDURE TODAY AT [facility]:   Refer to the procedure report that was given to you for any specific questions about what was found during the examination.  If the procedure report does not answer your questions, please call your gastroenterologist to clarify.  If you requested that your care partner not be given the details of your procedure findings, then the procedure report has been included in a sealed envelope for you to review at your convenience later.  YOU SHOULD EXPECT: Some feelings of bloating in the abdomen. Passage of more gas than usual.  Walking can help get rid of the air that was put into your GI tract during the procedure and reduce the bloating. If you had a lower endoscopy (such as a colonoscopy or flexible sigmoidoscopy) you may notice spotting of blood in your stool or on the toilet paper. If you underwent a bowel prep for your procedure, you may not have a normal bowel movement for a few days.  Please Note:  You might notice some irritation and congestion in your nose or some drainage.  This is from the oxygen used during your procedure.  There is no need for concern and it should clear up in a day or so.  SYMPTOMS TO REPORT IMMEDIATELY:  Following lower endoscopy (colonoscopy or flexible sigmoidoscopy):  Excessive amounts of blood in the stool  Significant tenderness or worsening of abdominal pains  Swelling of the abdomen that is new, acute  Fever of 100F or higher  Following upper endoscopy (EGD)  Vomiting of blood or coffee ground material  New chest pain or pain under the shoulder blades  Painful or persistently difficult swallowing  New shortness of breath  Fever of 100F or higher  Black, tarry-looking stools  For urgent or emergent issues, a gastroenterologist can be reached at any hour by calling [phone]. Do not use MyChart messaging for urgent concerns.    DIET:  We do recommend a small meal at first, but  then you may proceed to your regular diet.  Drink plenty of fluids but you should avoid alcoholic beverages for 24 hours.  MEDICATIONS: Continue present medications. Use Prilosec (omeprazole) 20 mg by mouth twice daily for 4 weeks to heal esophagitis and gastric erosions. Avoid non-steroidal anti-inflammatory drugs such as Motrin (ibuprofen), Alleve, Naproxen, Advil, etc. You may  continue your 81 mg Aspirin daily and Tylenol is OK.  FOLLOW UP: Repeat CBC (blood draw) in 3 months or as directed by hematologist. If your anemia is not improving with iron replacement and no other causes for your anemia are found, follow up with Dr. Candis Schatz in his office to discuss repeat endoscopy versus capsule endoscopy.  Please see handouts given to you by your recovery nurse.  Thank you for allowing [place] to provide for your healthcare needs today.  ACTIVITY:  You should plan to take it easy for the rest of today and you should NOT DRIVE or use heavy machinery until tomorrow (because of the sedation medicines used during the test).    FOLLOW UP: Our staff will call the number listed on your records 48-72 hours following your procedure to check on you and address any questions or concerns that you may have regarding the information given to you following your procedure. If we do not reach you, we will leave a message.  We will attempt to reach you two times.  During this call, we will ask if you have developed any symptoms  of COVID 19. If you develop any symptoms (ie: fever, flu-like symptoms, shortness of breath, cough etc.) before then, please call [phone].  If you test positive for Covid 19 in the 2 weeks post procedure, please call and report this information to [place].    If any biopsies were taken you will be contacted by phone or by letter within the next 1-3 weeks.  Please call us at [phone] if you have not heard about the biopsies in 3 weeks.    SIGNATURES/CONFIDENTIALITY: You and/or your care  partner have signed paperwork which will be entered into your electronic medical record.  These signatures attest to the fact that that the information above on your After Visit Summary has been reviewed and is understood.  Full responsibility of the confidentiality of this discharge information lies with you and/or your care-partner.

## 2021-08-27 NOTE — Progress Notes (Signed)
Called to room to assist during endoscopic procedure.  Patient ID and intended procedure confirmed with present staff. Received instructions for my participation in the procedure from the performing physician.  

## 2021-08-27 NOTE — Progress Notes (Signed)
[facility] History and Physical   Primary Care Physician:  Tonia Ghent, MD   Reason for Procedure:   Iron deficiency   Plan:    EGD/Colo     HPI: Theodore Estes is a 75 y.o. male undergoing EGD and colonoscopy to evaluate new anemia with questionable iron deficiency. He has no chronic GI symptoms and no overt GI bleeding.  He had a normal colonoscopy in 2015.  Past Medical History:  Diagnosis Date   Anemia    Diabetes mellitus without complication ([facility])    Dyspnea    Erectile dysfunction    History of colonic polyps    Hyperlipidemia    Hypertension    Hypotestosteronism     Past Surgical History:  Procedure Laterality Date   COLONOSCOPY  08/18/2003   cyst excised from right wrist  2000   VASECTOMY  1977    Prior to Admission medications   Medication Sig Start Date End Date Taking? Authorizing Provider  aspirin 81 MG tablet Take 81 mg by mouth daily.   Yes [provider]  Blood Glucose Monitoring Suppl (ONE TOUCH ULTRA 2) w/Device KIT Check blood sugar twice a day and as directed. Dx. E11.65 05/31/18  Yes Tonia Ghent, MD  lovastatin (MEVACOR) 20 MG tablet Take 1 tablet (20 mg total) by mouth daily. 08/24/20  Yes Tonia Ghent, MD  metFORMIN (GLUCOPHAGE) 1000 MG tablet Take 1 tablet (1,000 mg total) by mouth 2 (two) times daily with a meal. 08/24/20  Yes Tonia Ghent, MD  minocycline (DYNACIN) 100 MG tablet Take 100 mg by mouth 2 (two) times daily.   Yes [provider]  [facility] DELICA LANCETS 29H MISC Check blood sugar twice a day and as directed. Dx. E11.65 12/04/15  Yes Tonia Ghent, MD  [facility] ULTRA test strip USE TO CHECK BLOOD SUGARS TWICE DAILY ASDIRECTED. 08/24/20  Yes Tonia Ghent, MD  pioglitazone (ACTOS) 15 MG tablet Take 1 tablet (15 mg total) by mouth daily. 08/24/20  Yes Tonia Ghent, MD    Current Outpatient Medications  Medication Sig Dispense Refill   aspirin 81 MG tablet Take 81 mg by mouth  daily.     Blood Glucose Monitoring Suppl (ONE TOUCH ULTRA 2) w/Device KIT Check blood sugar twice a day and as directed. Dx. E11.65 1 each 0   lovastatin (MEVACOR) 20 MG tablet Take 1 tablet (20 mg total) by mouth daily. 90 tablet 3   metFORMIN (GLUCOPHAGE) 1000 MG tablet Take 1 tablet (1,000 mg total) by mouth 2 (two) times daily with a meal. 180 tablet 3   minocycline (DYNACIN) 100 MG tablet Take 100 mg by mouth 2 (two) times daily.     ONETOUCH DELICA LANCETS 37J MISC Check blood sugar twice a day and as directed. Dx. E11.65 100 each 5   ONETOUCH ULTRA test strip USE TO CHECK BLOOD SUGARS TWICE DAILY ASDIRECTED. 100 each 11   pioglitazone (ACTOS) 15 MG tablet Take 1 tablet (15 mg total) by mouth daily. 90 tablet 3   Current Facility-Administered Medications  Medication Dose Route Frequency Provider Last Rate Last Admin   0.9 %  sodium chloride infusion  500 mL Intravenous Once Daryel November, MD        Allergies as of 08/27/2021   (No Known Allergies)    Family History  Problem Relation Age of Onset   Cancer Mother        colon   Colon cancer Mother 91  Heart disease Father    Hyperlipidemia Sister    Diabetes Brother    Hypertension Brother    Prostate cancer Neg Hx    Stomach cancer Neg Hx    Esophageal cancer Neg Hx     Social History   Socioeconomic History   Marital status: Married    Spouse name: Not on file   Number of children: Not on file   Years of education: Not on file   Highest education level: Not on file  Occupational History   Not on file  Tobacco Use   Smoking status: Never   Smokeless tobacco: Never  Vaping Use   Vaping Use: Never used  Substance and Sexual Activity   Alcohol use: No    Alcohol/week: 0.0 standard drinks   Drug use: No   Sexual activity: Yes    Partners: Female  Other Topics Concern   Not on file  Social History Narrative   HS[employer], [employer]   Work: Sports coach   Married '69   2 daughters '76 '77'    5  grandchildren   Marriage in good health   Social Determinants of Health   Financial Resource Strain: Not on file  Food Insecurity: Not on file  Transportation Needs: Not on file  Physical Activity: Not on file  Stress: Not on file  Social Connections: Not on file  Intimate Partner Violence: Not on file    Review of Systems:  All other review of systems negative except as mentioned in the HPI.  Physical Exam: Vital signs BP (!) 146/71   Pulse 88   Temp 98 F (36.7 C)   Ht 5' 6"  (1.676 m)   Wt 200 lb (90.7 kg)   SpO2 98%   BMI 32.28 kg/m   General:   Alert,  Well-developed, well-nourished, pleasant and cooperative in NAD Airway:  Mallampati 3 Lungs:  Clear throughout to auscultation.   Heart:  Regular rate and rhythm; no murmurs, clicks, rubs,  or gallops. Abdomen:  Soft, nontender and nondistended. Normal bowel sounds.   Neuro/Psych:  Normal mood and affect. A and O x 3   Tabor Denham E. Candis Schatz, MD [facility]

## 2021-08-27 NOTE — Op Note (Signed)
[facility] Patient Name: Theodore Estes Procedure Date: 08/27/2021 1:22 PM MRN: [id number] Endoscopist: Nicki Reaper E. Candis Schatz , MD Age: 75 Referring MD:  Date of Birth: 1946-06-06 Gender: Male Account #: [id number] Procedure:                Upper GI endoscopy Indications:              Iron deficiency anemia Medicines:                Monitored Anesthesia Care Procedure:                Pre-Anesthesia Assessment:                           - Prior to the procedure, a History and Physical                            was performed, and patient medications and                            allergies were reviewed. The patient's tolerance of                            previous anesthesia was also reviewed. The risks                            and benefits of the procedure and the sedation                            options and risks were discussed with the patient.                            All questions were answered, and informed consent                            was obtained. Prior Anticoagulants: The patient has                            taken no previous anticoagulant or antiplatelet                            agents except for aspirin. ASA Grade Assessment: II                            - A patient with mild systemic disease. After                            reviewing the risks and benefits, the patient was                            deemed in satisfactory condition to undergo the                            procedure.  After obtaining informed consent, the endoscope was                            passed under direct vision. Throughout the                            procedure, the patient's blood pressure, pulse, and                            oxygen saturations were monitored continuously. The                            Endoscope was introduced through the mouth, and                            advanced to the third part of duodenum. The upper                             GI endoscopy was accomplished without difficulty.                            The patient tolerated the procedure well. Scope In: Scope Out: Findings:                 The examined portions of the nasopharynx,                            oropharynx and larynx were normal.                           LA Grade A (one or more mucosal breaks less than 5                            mm, not extending between tops of 2 mucosal folds)                            esophagitis with no bleeding was found at the                            gastroesophageal junction.                           The exam of the esophagus was otherwise normal.                           The gastroesophageal flap valve was visualized                            endoscopically and classified as Hill Grade IV (no                            fold, wide open lumen, hiatal hernia present).  A 3 cm hiatal hernia was present.                           Multiple dispersed diminutive erosions with                            stigmata of recent bleeding were found in the                            gastric antrum. Biopsies were taken with a cold                            forceps for Helicobacter pylori testing. Estimated                            blood loss was minimal.                           The exam of the stomach was otherwise normal.                           The examined duodenum was normal. Complications:            No immediate complications. Estimated Blood Loss:     Estimated blood loss was minimal. Impression:               - The examined portions of the nasopharynx,                            oropharynx and larynx were normal.                           - LA Grade A reflux esophagitis with no bleeding.                           - Gastroesophageal flap valve classified as Hill                            Grade IV (no fold, wide open lumen, hiatal hernia                            present).                            - 3 cm hiatal hernia.                           - Erosive gastropathy with scattered flecks of                            hematin. Biopsied. This could be a potential source                            of anemia                           -  Normal examined duodenum. Recommendation:           - Patient has a contact number available for                            emergencies. The signs and symptoms of potential                            delayed complications were discussed with the                            patient. Return to normal activities tomorrow.                            Written discharge instructions were provided to the                            patient.                           - Resume previous diet.                           - Continue present medications.                           - Await pathology results.                           - Use Prilosec (omeprazole) 20 mg PO BID for 4                            weeks to heal esophagitis and gastric erosions.                           - Avoid NSAIDs                           - Repeat CBC in 3 months or as directed by                            hematologist                           - If anemia not improving with iron replacement and                            no other etiologies for anemia found, follow up in                            GI clinic to discuss repeat endoscopy vs capsule                            endoscopy. Amour Trigg E. Candis Schatz, MD 08/27/2021 2:14:40 PM This report has been signed electronically.

## 2021-08-27 NOTE — Progress Notes (Signed)
Report to PACU, RN, vss, BBS= Clear.  

## 2021-08-27 NOTE — Op Note (Signed)
[facility] Patient Name: Theodore Estes Procedure Date: 08/27/2021 1:21 PM MRN: [id number] Endoscopist: Nicki Reaper E. Candis Schatz , MD Age: 75 Referring MD:  Date of Birth: 02-28-1946 Gender: Male Account #: [id number] Procedure:                Colonoscopy Indications:              Iron deficiency anemia Medicines:                Monitored Anesthesia Care Procedure:                Pre-Anesthesia Assessment:                           - Prior to the procedure, a History and Physical                            was performed, and patient medications and                            allergies were reviewed. The patient's tolerance of                            previous anesthesia was also reviewed. The risks                            and benefits of the procedure and the sedation                            options and risks were discussed with the patient.                            All questions were answered, and informed consent                            was obtained. Prior Anticoagulants: The patient has                            taken no previous anticoagulant or antiplatelet                            agents except for aspirin. ASA Grade Assessment: II                            - A patient with mild systemic disease. After                            reviewing the risks and benefits, the patient was                            deemed in satisfactory condition to undergo the                            procedure.  After obtaining informed consent, the colonoscope                            was passed under direct vision. Throughout the                            procedure, the patient's blood pressure, pulse, and                            oxygen saturations were monitored continuously. The                            CF [id number] #[id number] was introduced through the anus                            and advanced to the the terminal ileum, with                             identification of the appendiceal orifice and IC                            valve. The colonoscopy was performed without                            difficulty. The patient tolerated the procedure                            well. The quality of the bowel preparation was                            good. The terminal ileum, ileocecal valve,                            appendiceal orifice, and rectum were photographed. Scope In: 1:43:33 PM Scope Out: 1:59:58 PM Scope Withdrawal Time: 0 hours 12 minutes 6 seconds  Total Procedure Duration: 0 hours 16 minutes 25 seconds  Findings:                 The perianal and digital rectal examinations were                            normal. Pertinent negatives include normal                            sphincter tone and no palpable rectal lesions.                           A 5 mm polyp was found in the descending colon. The                            polyp was sessile. The polyp was removed with a                            cold snare. Resection  and retrieval were complete.                            Estimated blood loss was minimal.                           A 7 mm polyp was found in the splenic flexure. The                            polyp was sessile. Polypectomy was attempted,                            initially using a cold snare, but was somewhat                            difficult due to its position on a fold. Polyp                            resection was incomplete with this device with a                            small fragment of polypoid tissue remaining. The                            remainder of the polyp was removed with a cold                            biopsy forceps. Resection and retrieval were                            complete. Estimated blood loss was minimal.                           Multiple small-mouthed diverticula were found in                            the sigmoid colon and descending colon.                            The exam was otherwise normal throughout the                            examined colon.                           The terminal ileum appeared normal.                           Non-bleeding internal hemorrhoids were found during                            retroflexion. The hemorrhoids were Grade I                            (internal  hemorrhoids that do not prolapse).                           No additional abnormalities were found on                            retroflexion. Complications:            No immediate complications. Estimated Blood Loss:     Estimated blood loss was minimal. Impression:               - One 5 mm polyp in the descending colon, removed                            with a cold snare. Resected and retrieved.                           - One 7 mm polyp at the splenic flexure, removed                            with a cold snare and biopsy forceps. Resected and                            retrieved.                           - Diverticulosis in the sigmoid colon and in the                            descending colon.                           - The examined portion of the ileum was normal.                           - Non-bleeding internal hemorrhoids. Recommendation:           - Patient has a contact number available for                            emergencies. The signs and symptoms of potential                            delayed complications were discussed with the                            patient. Return to normal activities tomorrow.                            Written discharge instructions were provided to the                            patient.                           - Resume previous diet.                           -  Continue present medications.                           - Await pathology results.                           - Due to patient's age and lack of advanced polyps,                            I would recommend against any further screening                             colonopies/polyp surveillance. [name], MD 08/27/2021 2:21:30 PM This report has been signed electronically.

## 2021-08-31 ENCOUNTER — Telehealth: Payer: Self-pay | Admitting: *Deleted

## 2021-08-31 ENCOUNTER — Other Ambulatory Visit: Payer: Self-pay | Admitting: Family Medicine

## 2021-08-31 NOTE — Telephone Encounter (Signed)
  Follow up Call-  Call back number 08/27/2021  Post procedure Call Back phone  # [phone]  Permission to leave phone message Yes  Some recent data might be hidden     Patient questions:  Do you have a fever, pain , or abdominal swelling? No. Pain Score  0 *  Have you tolerated food without any problems? Yes.    Have you been able to return to your normal activities? Yes.    Do you have any questions about your discharge instructions: Diet   No. Medications  No. Follow up visit  No.  Do you have questions or concerns about your Care? No.  Actions: * If pain score is 4 or above: No action needed, pain <4.  Have you developed a fever since your procedure? no  2.   Have you had an respiratory symptoms (SOB or cough) since your procedure? no  3.   Have you tested positive for COVID 19 since your procedure no  4.   Have you had any family members/close contacts diagnosed with the COVID 19 since your procedure?  no   If yes to any of these questions please route to Joylene John, RN and Joella Prince, RN

## 2021-09-07 NOTE — Addendum Note (Signed)
Addended by: Dustin Flock E on: 09/07/2021 01:17 PM   Modules accepted: Orders

## 2021-09-07 NOTE — Progress Notes (Signed)
Theodore Estes,  The biopsies taken from your stomach were normal, with no evidence of H. Pylori infection.  I recommend you continue the Prilosec as discussed and that we repeat a CBC in 3 months.  Please come to our lab sometime in mid-late February to recheck your blood counts.  The polyps removed from your colon were considered 'pre-cancerous' but have been completely removed.  Current guidelines suggest a repeat colonoscopy in 7-10 years when 1-2 small polyps have been removed.  As you would be in your 80s at that point, I recommend against any further routine surveillance colonoscopies.

## 2021-09-22 ENCOUNTER — Telehealth: Payer: Self-pay | Admitting: Primary Care

## 2021-09-22 NOTE — Telephone Encounter (Signed)
Patient is aware of results and voiced his understanding.  Video visit scheduled for 09/29/2021 at 10:30. Nothing further needed at this time.

## 2021-09-22 NOTE — Telephone Encounter (Signed)
HST on 08/24/21 showed moderate OSA, AHI 22/7/hr with SpO2 low 83%. Needs visit to review results and treatment plan

## 2021-09-23 NOTE — Progress Notes (Signed)
Subjective:   Theodore Estes is a 75 y.o. male who presents for Medicare Annual/Subsequent preventive examination.  I connected with [name] today by telephone and verified that I am speaking with the correct person using two identifiers. Location patient: home Location provider: work Persons participating in the virtual visit: patient, Marine scientist.    I discussed the limitations, risks, security and privacy concerns of performing an evaluation and management service by telephone and the availability of in person appointments. I also discussed with the patient that there may be a patient responsible charge related to this service. The patient expressed understanding and verbally consented to this telephonic visit.    Interactive audio and video telecommunications were attempted between this provider and patient, however failed, due to patient having technical difficulties OR patient did not have access to video capability.  We continued and completed visit with audio only.  Some vital signs may be absent or patient reported.   Time Spent with patient on telephone encounter: 25 minutes  Review of Systems     Cardiac Risk Factors include: advanced age (>46mn, >>71women);diabetes mellitus;hypertension;dyslipidemia     Objective:    Today's Vitals   09/28/21 1316  Weight: 195 lb (88.5 kg)  Height: 5' 6"  (1.676 m)   Body mass index is 31.47 kg/m.  Advanced Directives 09/28/2021 06/23/2021 04/27/2018 04/04/2016  Does Patient Have a Medical Advance Directive? Yes Yes Yes Yes  Type of AParamedicof ADool[place]Living will Living will [place]Living will Living will  Does patient want to make changes to medical advance directive? Yes (MAU/Ambulatory/Procedural Areas - Information given) - - No - Patient declined  [place] of [place]? - - No - copy requested No - copy requested    Current Medications  (verified) Outpatient Encounter Medications as of 09/28/2021  Medication Sig   aspirin 81 MG tablet Take 81 mg by mouth daily.   Blood Glucose Monitoring Suppl (ONE TOUCH ULTRA 2) w/Device KIT Check blood sugar twice a day and as directed. Dx. E11.65   lovastatin (MEVACOR) 20 MG tablet TAKE 1 TABLET BY MOUTH ONCE A DAY   metFORMIN (GLUCOPHAGE) 1000 MG tablet Take 1 tablet (1,000 mg total) by mouth 2 (two) times daily with a meal.   minocycline (DYNACIN) 100 MG tablet Take 100 mg by mouth 2 (two) times daily.   omeprazole (PRILOSEC) 20 MG capsule Take 1 capsule (20 mg total) by mouth 2 (two) times daily before a meal.   ONETOUCH DELICA LANCETS 393YMISC Check blood sugar twice a day and as directed. Dx. E11.65   ONETOUCH ULTRA test strip USE TO CHECK BLOOD SUGARS TWICE DAILY ASDIRECTED.   pioglitazone (ACTOS) 15 MG tablet TAKE 1 TABLET BY MOUTH ONCE A DAY   Facility-Administered Encounter Medications as of 09/28/2021  Medication   0.9 %  sodium chloride infusion    Allergies (verified) Patient has no known allergies.   History: Past Medical History:  Diagnosis Date   Anemia    Diabetes mellitus without complication ([place]    Dyspnea    Erectile dysfunction    History of colonic polyps    Hyperlipidemia    Hypertension    Hypotestosteronism    Past Surgical History:  Procedure Laterality Date   COLONOSCOPY  08/18/2003   cyst excised from right wrist  2000   VASECTOMY  1977   Family History  Problem Relation Age of Onset   Cancer Mother  colon   Colon cancer Mother 50   Heart disease Father    Hyperlipidemia Sister    Diabetes Brother    Hypertension Brother    Prostate cancer Neg Hx    Stomach cancer Neg Hx    Esophageal cancer Neg Hx    Social History   Socioeconomic History   Marital status: Married    Spouse name: Not on file   Number of children: Not on file   Years of education: Not on file   Highest education level: Not on file  Occupational History    Not on file  Tobacco Use   Smoking status: Never   Smokeless tobacco: Never  Vaping Use   Vaping Use: Never used  Substance and Sexual Activity   Alcohol use: No    Alcohol/week: 0.0 standard drinks   Drug use: No   Sexual activity: Yes    Partners: Female  Other Topics Concern   Not on file  Social History Narrative   HSG, [employer]   Work: Sports coach   Married '69   2 daughters '76 '77'    5 grandchildren   Marriage in good health   Social Determinants of Health   Financial Resource Strain: Low Risk    Difficulty of Paying Living Expenses: Not hard at all  Food Insecurity: No Food Insecurity   Worried About Charity fundraiser in the Last Year: Never true   [place] in the Last Year: Never true  Transportation Needs: No Transportation Needs   Lack of Transportation (Medical): No   Lack of Transportation (Non-Medical): No  Physical Activity: Inactive   Days of Exercise per Week: 0 days   Minutes of Exercise per Session: 0 min  Stress: No Stress Concern Present   Feeling of Stress : Only a little  Social Connections: Moderately Isolated   Frequency of Communication with Friends and Family: More than three times a week   Frequency of Social Gatherings with Friends and Family: More than three times a week   Attends Religious Services: Never   Marine scientist or Organizations: No   Attends Music therapist: Never   Marital Status: Married    Tobacco Counseling Counseling given: Not Answered   Clinical Intake:  Pre-visit preparation completed: Yes  Pain : No/denies pain     BMI - recorded: 31.47 Nutritional Status: BMI > 30  Obese Nutritional Risks: None Diabetes: Yes CBG done?: No Did pt. bring in CBG monitor from home?: No  How often do you need to have someone help you when you read instructions, pamphlets, or other written materials from your doctor or pharmacy?: 1 - Never  Diabetes:  Is the patient diabetic?  Yes   If diabetic, was a CBG obtained today?  No  Did the patient bring in their glucometer from home?  No  How often do you monitor your CBG's? daily.   Financial Strains and Diabetes Management:  Are you having any financial strains with the device, your supplies or your medication? No .  Does the patient want to be seen by Chronic Care Management for management of their diabetes?  No  Would the patient like to be referred to a Nutritionist or for Diabetic Management?  No   Diabetic Exams:  Diabetic Eye Exam: Completed 06/2021.  Diabetic Foot Exam: Completed 04/26/21.   Interpreter Needed?: No  Information entered by :: Orrin Brigham LPN   Activities of Daily Living In your  present state of health, do you have any difficulty performing the following activities: 09/28/2021  Hearing? Y  Vision? Y  Difficulty concentrating or making decisions? N  Walking or climbing stairs? N  Dressing or bathing? N  Doing errands, shopping? N  Preparing Food and eating ? N  Using the Toilet? N  In the past six months, have you accidently leaked urine? N  Do you have problems with loss of bowel control? N  Managing your Medications? N  Managing your Finances? N  Housekeeping or managing your Housekeeping? N  Some recent data might be hidden    Patient Care Team: Tonia Ghent, MD as PCP - General (Family Medicine) Minna Merritts, MD as Consulting Physician (Cardiology)  Indicate any recent Medical Services you may have received from other than [facility] providers in the past year (date may be approximate).     Assessment:   This is a routine wellness examination for Donat.  Hearing/Vision screen Hearing Screening - Comments:: Decrease in hearing  Vision Screening - Comments:: Last exam 06/2021, Dr. Leonides Schanz, wears glasses  Dietary issues and exercise activities discussed: Current Exercise Habits: The patient does not participate in regular exercise at present   Goals Addressed              This Visit's Progress    Patient Stated       Would like to lose weight. Patient would like to lose 25 pounds       Depression Screen PHQ 2/9 Scores 09/28/2021 08/24/2020 05/20/2019 04/27/2018 04/04/2016 02/11/2015 01/02/2014  PHQ - 2 Score 0 0 0 0 0 0 0  PHQ- 9 Score - - - 0 - - -    Fall Risk Fall Risk  09/28/2021 08/24/2020 05/20/2019 04/27/2018 04/20/2017  Falls in the past year? 0 0 0 No No  Comment - - - - -  Number falls in past yr: 0 0 - - -  Injury with Fall? 0 0 - - -  Risk for fall due to : No Fall Risks - - - -  Follow up Falls prevention discussed Falls evaluation completed - - -    FALL RISK PREVENTION PERTAINING TO THE HOME:  Any stairs in or around the home? Yes  If so, are there any without handrails? No  Home free of loose throw rugs in walkways, pet beds, electrical cords, etc? Yes  Adequate lighting in your home to reduce risk of falls? Yes   ASSISTIVE DEVICES UTILIZED TO PREVENT FALLS:  Life alert? No  Use of a cane, walker or w/c? No  Grab bars in the bathroom? Yes  Shower chair or bench in shower? Yes  Elevated toilet seat or a handicapped toilet? No   TIMED UP AND GO:  Was the test performed? No .    Cognitive Function: Normal cognitive status assessed by  this Nurse Health Advisor. No abnormalities found.   MMSE - Mini Mental State Exam 04/27/2018 04/04/2016  Orientation to time 5 5  Orientation to Place 5 5  Registration 3 3  Attention/ Calculation 0 0  Recall 2 3  Recall-comments unable to recall 1 of 3 words -  Language- name 2 objects 0 0  Language- repeat 1 1  Language- follow 3 step command 3 3  Language- read & follow direction 0 0  Write a sentence 0 0  Copy design 0 0  Total score 19 20        Immunizations Immunization History  Administered Date(s)  Administered   Fluad Quad(high Dose 65+) 06/06/2019   Influenza Inj Mdck Quad Pf 08/19/2017   Influenza Whole 06/26/2008, 06/26/2009, 10/19/2011   Influenza, Seasonal, Injecte,  Preservative Fre 07/05/2016   Influenza,inj,Quad PF,6+ Mos 07/04/2014, 08/19/2015   Influenza-Unspecified 07/27/2013, 09/03/2018, 06/26/2020   Moderna Sars-Covid-2 Vaccination 11/01/2019, 11/27/2019, 07/27/2020   Pneumococcal Conjugate-13 02/11/2015   Pneumococcal Polysaccharide-23 01/27/2012   Tdap 01/27/2012   Zoster Recombinat (Shingrix) 09/03/2018, 12/17/2018   Zoster, Live 06/26/2008    TDAP status: Up to date  Flu Vaccine status: Due, Education has been provided regarding the importance of this vaccine. Advised may receive this vaccine at local pharmacy or Health Dept. Aware to provide a copy of the vaccination record if obtained from local pharmacy or Health Dept. Verbalized acceptance and understanding.  Pneumococcal vaccine status: Up to date  Covid-19 vaccine status: Information provided on how to obtain vaccines.   Qualifies for Shingles Vaccine? Yes   Zostavax completed Yes   Shingrix Completed?: Yes  Screening Tests Health Maintenance  Topic Date Due   COVID-19 Vaccine (4 - Booster for Moderna series) 09/21/2020   OPHTHALMOLOGY EXAM  03/25/2021   INFLUENZA VACCINE  12/24/2021 (Originally 04/26/2021)   HEMOGLOBIN A1C  10/27/2021   TETANUS/TDAP  01/26/2022   FOOT EXAM  04/26/2022   COLONOSCOPY (Pts 45-27yr Insurance coverage will need to be confirmed)  08/28/2031   Pneumonia Vaccine 75 Years old  Completed   Hepatitis C Screening  Completed   Zoster Vaccines- Shingrix  Completed   HPV VACCINES  Aged Out    Health Maintenance  Health Maintenance Due  Topic Date Due   COVID-19 Vaccine (4 - Booster for Moderna series) 09/21/2020   OPHTHALMOLOGY EXAM  03/25/2021    Colorectal cancer screening: Type of screening: Colonoscopy. Completed 08/27/21. Repeat every 10 years  Lung Cancer Screening: (Low Dose CT Chest recommended if Age 75-80years, 30 pack-year currently smoking OR have quit w/in 15years.) does not qualify.     Additional Screening:  Hepatitis C  Screening: does qualify; Completed 04/05/16  Vision Screening: Recommended annual ophthalmology exams for early detection of glaucoma and other disorders of the eye. Is the patient up to date with their annual eye exam?  Yes  Who is the provider or what is the name of the office in which the patient attends annual eye exams? Dr. WLeonides Schanz Dental Screening: Recommended annual dental exams for proper oral hygiene  Community Resource Referral / Chronic Care Management: CRR required this visit?  No   CCM required this visit?  No      Plan:     I have personally reviewed and noted the following in the patients chart:   Medical and social history Use of alcohol, tobacco or illicit drugs  Current medications and supplements including opioid prescriptions. Patient is not currently taking opioid prescriptions. Functional ability and status Nutritional status Physical activity Advanced directives List of other physicians Hospitalizations, surgeries, and ER visits in previous 12 months Vitals Screenings to include cognitive, depression, and falls Referrals and appointments  In addition, I have reviewed and discussed with patient certain preventive protocols, quality metrics, and best practice recommendations. A written personalized care plan for preventive services as well as general preventive health recommendations were provided to patient.   Due to this being a telephonic visit, the after visit summary with patients personalized plan was offered to patient via mail or my-chart. Patient would like to access on my-chart.     TLoma Messing LPN  09/28/2021   Nurse Health Advisor  Nurse Notes: none

## 2021-09-28 ENCOUNTER — Ambulatory Visit (INDEPENDENT_AMBULATORY_CARE_PROVIDER_SITE_OTHER): Payer: PPO

## 2021-09-28 VITALS — Ht 66.0 in | Wt 195.0 lb

## 2021-09-28 DIAGNOSIS — Z0000 Encounter for general adult medical examination without abnormal findings: Secondary | ICD-10-CM | POA: Diagnosis not present

## 2021-09-28 NOTE — Patient Instructions (Signed)
Theodore Estes , Thank you for taking time to complete your Medicare Wellness Visit. I appreciate your ongoing commitment to your health goals. Please review the following plan we discussed and let me know if I can assist you in the future.   Screening recommendations/referrals: Colonoscopy: up to date, completed 08/27/21, due 08/28/31 Recommended yearly ophthalmology/optometry visit for glaucoma screening and checkup Recommended yearly dental visit for hygiene and checkup  Vaccinations: Influenza vaccine: Due-May obtain vaccine at our office or your local pharmacy. Pneumococcal vaccine: up to date Tdap vaccine: up to date, completed 01/27/12, due 01/26/22 Shingles vaccine: up to date   Covid-19: newest booster available at your local pharmacy  Advanced directives: Please bring a copy of Living Will and/or [place] for your chart.   Conditions/risks identified: see problem list   Next appointment: Follow up in one year for your annual wellness visit. 09/30/22 @ 9:45am, this will be a telephone visit  Preventive Care 49 Years and Older, Male Preventive care refers to lifestyle choices and visits with your health care provider that can promote health and wellness. What does preventive care include? A yearly physical exam. This is also called an annual well check. Dental exams once or twice a year. Routine eye exams. Ask your health care provider how often you should have your eyes checked. Personal lifestyle choices, including: Daily care of your teeth and gums. Regular physical activity. Eating a healthy diet. Avoiding tobacco and drug use. Limiting alcohol use. Practicing safe sex. Taking low doses of aspirin every day. Taking vitamin and mineral supplements as recommended by your health care provider. What happens during an annual well check? The services and screenings done by your health care provider during your annual well check will depend on your age, overall  health, lifestyle risk factors, and family history of disease. Counseling  Your health care provider may ask you questions about your: Alcohol use. Tobacco use. Drug use. Emotional well-being. Home and relationship well-being. Sexual activity. Eating habits. History of falls. Memory and ability to understand (cognition). Work and work Statistician. Screening  You may have the following tests or measurements: Height, weight, and BMI. Blood pressure. Lipid and cholesterol levels. These may be checked every 5 years, or more frequently if you are over 73 years old. Skin check. Lung cancer screening. You may have this screening every year starting at age 6 if you have a 30-pack-year history of smoking and currently smoke or have quit within the past 15 years. Fecal occult blood test (FOBT) of the stool. You may have this test every year starting at age 64. Flexible sigmoidoscopy or colonoscopy. You may have a sigmoidoscopy every 5 years or a colonoscopy every 10 years starting at age 3. Prostate cancer screening. Recommendations will vary depending on your family history and other risks. Hepatitis C blood test. Hepatitis B blood test. Sexually transmitted disease (STD) testing. Diabetes screening. This is done by checking your blood sugar (glucose) after you have not eaten for a while (fasting). You may have this done every 1-3 years. Abdominal aortic aneurysm (AAA) screening. You may need this if you are a current or former smoker. Osteoporosis. You may be screened starting at age 23 if you are at high risk. Talk with your health care provider about your test results, treatment options, and if necessary, the need for more tests. Vaccines  Your health care provider may recommend certain vaccines, such as: Influenza vaccine. This is recommended every year. Tetanus, diphtheria, and acellular pertussis (  Tdap, Td) vaccine. You may need a Td booster every 10 years. Zoster vaccine. You may  need this after age 34. Pneumococcal 13-valent conjugate (PCV13) vaccine. One dose is recommended after age 66. Pneumococcal polysaccharide (PPSV23) vaccine. One dose is recommended after age 13. Talk to your health care provider about which screenings and vaccines you need and how often you need them. This information is not intended to replace advice given to you by your health care provider. Make sure you discuss any questions you have with your health care provider. Document Released: 10/09/2015 Document Revised: 06/01/2016 Document Reviewed: 07/14/2015 Elsevier Interactive Patient Education  2017 [place] Prevention in the Home Falls can cause injuries. They can happen to people of all ages. There are many things you can do to make your home safe and to help prevent falls. What can I do on the outside of my home? Regularly fix the edges of walkways and driveways and fix any cracks. Remove anything that might make you trip as you walk through a door, such as a raised step or threshold. Trim any bushes or trees on the path to your home. Use bright outdoor lighting. Clear any walking paths of anything that might make someone trip, such as rocks or tools. Regularly check to see if handrails are loose or broken. Make sure that both sides of any steps have handrails. Any raised decks and porches should have guardrails on the edges. Have any leaves, snow, or ice cleared regularly. Use sand or salt on walking paths during winter. Clean up any spills in your garage right away. This includes oil or grease spills. What can I do in the bathroom? Use night lights. Install grab bars by the toilet and in the tub and shower. Do not use towel bars as grab bars. Use non-skid mats or decals in the tub or shower. If you need to sit down in the shower, use a plastic, non-slip stool. Keep the floor dry. Clean up any water that spills on the floor as soon as it happens. Remove soap buildup in  the tub or shower regularly. Attach bath mats securely with double-sided non-slip rug tape. Do not have throw rugs and other things on the floor that can make you trip. What can I do in the bedroom? Use night lights. Make sure that you have a light by your bed that is easy to reach. Do not use any sheets or blankets that are too big for your bed. They should not hang down onto the floor. Have a firm chair that has side arms. You can use this for support while you get dressed. Do not have throw rugs and other things on the floor that can make you trip. What can I do in the kitchen? Clean up any spills right away. Avoid walking on wet floors. Keep items that you use a lot in easy-to-reach places. If you need to reach something above you, use a strong step stool that has a grab bar. Keep electrical cords out of the way. Do not use floor polish or wax that makes floors slippery. If you must use wax, use non-skid floor wax. Do not have throw rugs and other things on the floor that can make you trip. What can I do with my stairs? Do not leave any items on the stairs. Make sure that there are handrails on both sides of the stairs and use them. Fix handrails that are broken or loose. Make sure that handrails are  as long as the stairways. Check any carpeting to make sure that it is firmly attached to the stairs. Fix any carpet that is loose or worn. Avoid having throw rugs at the top or bottom of the stairs. If you do have throw rugs, attach them to the floor with carpet tape. Make sure that you have a light switch at the top of the stairs and the bottom of the stairs. If you do not have them, ask someone to add them for you. What else can I do to help prevent falls? Wear shoes that: Do not have high heels. Have rubber bottoms. Are comfortable and fit you well. Are closed at the toe. Do not wear sandals. If you use a stepladder: Make sure that it is fully opened. Do not climb a closed  stepladder. Make sure that both sides of the stepladder are locked into place. Ask someone to hold it for you, if possible. Clearly mark and make sure that you can see: Any grab bars or handrails. First and last steps. Where the edge of each step is. Use tools that help you move around (mobility aids) if they are needed. These include: Canes. Walkers. Scooters. Crutches. Turn on the lights when you go into a dark area. Replace any light bulbs as soon as they burn out. Set up your furniture so you have a clear path. Avoid moving your furniture around. If any of your floors are uneven, fix them. If there are any pets around you, be aware of where they are. Review your medicines with your doctor. Some medicines can make you feel dizzy. This can increase your chance of falling. Ask your doctor what other things that you can do to help prevent falls. This information is not intended to replace advice given to you by your health care provider. Make sure you discuss any questions you have with your health care provider. Document Released: 07/09/2009 Document Revised: 02/18/2016 Document Reviewed: 10/17/2014 Elsevier Interactive Patient Education  2017 [employer].

## 2021-09-29 ENCOUNTER — Telehealth: Payer: Self-pay | Admitting: Primary Care

## 2021-09-29 ENCOUNTER — Telehealth (INDEPENDENT_AMBULATORY_CARE_PROVIDER_SITE_OTHER): Payer: PPO | Admitting: Primary Care

## 2021-09-29 ENCOUNTER — Other Ambulatory Visit: Payer: Self-pay

## 2021-09-29 DIAGNOSIS — G4733 Obstructive sleep apnea (adult) (pediatric): Secondary | ICD-10-CM | POA: Diagnosis not present

## 2021-09-29 NOTE — Patient Instructions (Addendum)
HST on 08/24/21 showed moderate obstructive sleep apnea, average apnea hypopnea index (AHI) 22/7/hr with SpO2 low 83%  Orders: New CPAP start 5-15cm h20, mask of choice, heated humidity, enroll in [place] (RE: OSA/snoring)  Follow-up: Please call our office once you get CPAP to set up follow-up visit 31-90 days after starting    CPAP and BIPAP Information CPAP and BIPAP are methods that use air pressure to keep your airways open and to help you breathe well. CPAP and BIPAP use different amounts of pressure. Your health care provider will tell you whether CPAP or BIPAP would be more helpful for you. CPAP stands for "continuous positive airway pressure." With CPAP, the amount of pressure stays the same while you breathe in (inhale) and out (exhale). BIPAP stands for "bi-level positive airway pressure." With BIPAP, the amount of pressure will be higher when you inhale and lower when you exhale. This allows you to take larger breaths. CPAP or BIPAP may be used in the hospital, or your health care provider may want you to use it at home. You may need to have a sleep study before your health care provider can order a machine for you to use at home. What are the advantages? CPAP or BIPAP can be helpful if you have: Sleep apnea. Chronic obstructive pulmonary disease (COPD). Heart failure. Medical conditions that cause muscle weakness, including muscular dystrophy or amyotrophic lateral sclerosis (ALS). Other problems that cause breathing to be shallow, weak, abnormal, or difficult. CPAP and BIPAP are most commonly used for obstructive sleep apnea (OSA) to keep the airways from collapsing when the muscles relax during sleep. What are the risks? Generally, this is a safe treatment. However, problems may occur, including: Irritated skin or skin sores if the mask does not fit properly. Dry or stuffy nose or nosebleeds. Dry mouth. Feeling gassy or bloated. Sinus or lung infection if the equipment is  not cleaned properly. When should CPAP or BIPAP be used? In most cases, the mask only needs to be worn during sleep. Generally, the mask needs to be worn throughout the night and during any daytime naps. People with certain medical conditions may also need to wear the mask at other times, such as when they are awake. Follow instructions from your health care provider about when to use the machine. What happens during CPAP or BIPAP? Both CPAP and BIPAP are provided by a small machine with a flexible plastic tube that attaches to a plastic mask that you wear. Air is blown through the mask into your nose or mouth. The amount of pressure that is used to blow the air can be adjusted on the machine. Your health care provider will set the pressure setting and help you find the best mask for you. Tips for using the mask Because the mask needs to be snug, some people feel trapped or closed-in (claustrophobic) when first using the mask. If you feel this way, you may need to get used to the mask. One way to do this is to hold the mask loosely over your nose or mouth and then gradually apply the mask more snugly. You can also gradually increase the amount of time that you use the mask. Masks are available in various types and sizes. If your mask does not fit well, talk with your health care provider about getting a different one. Some common types of masks include: Full face masks, which fit over the mouth and nose. Nasal masks, which fit over the nose. Nasal pillow  or prong masks, which fit into the nostrils. If you are using a mask that fits over your nose and you tend to breathe through your mouth, a chin strap may be applied to help keep your mouth closed. Use a skin barrier to protect your skin as told by your health care provider. Some CPAP and BIPAP machines have alarms that may sound if the mask comes off or develops a leak. If you have trouble with the mask, it is very important that you talk with your  health care provider about finding a way to make the mask easier to tolerate. Do not stop using the mask. There could be a negative impact on your health if you stop using the mask. Tips for using the machine Place your CPAP or BIPAP machine on a secure table or stand near an electrical outlet. Know where the on/off switch is on the machine. Follow instructions from your health care provider about how to set the pressure on your machine and when you should use it. Do not eat or drink while the CPAP or BIPAP machine is on. Food or fluids could get pushed into your lungs by the pressure of the CPAP or BIPAP. For home use, CPAP and BIPAP machines can be rented or purchased through home health care companies. Many different brands of machines are available. Renting a machine before purchasing may help you find out which particular machine works well for you. Your health insurance company may also decide which machine you may get. Keep the CPAP or BIPAP machine and attachments clean. Ask your health care provider for specific instructions. Check the humidifier if you have a dry stuffy nose or nosebleeds. Make sure it is working correctly. Follow these instructions at home: Take over-the-counter and prescription medicines only as told by your health care provider. Ask if you can take sinus medicine if your sinuses are blocked. Do not use any products that contain nicotine or tobacco. These products include cigarettes, chewing tobacco, and vaping devices, such as e-cigarettes. If you need help quitting, ask your health care provider. Keep all follow-up visits. This is important. Contact a health care provider if: You have redness or pressure sores on your head, face, mouth, or nose from the mask or head gear. You have trouble using the CPAP or BIPAP machine. You cannot tolerate wearing the CPAP or BIPAP mask. Someone tells you that you snore even when wearing your CPAP or BIPAP. Get help right away  if: You have trouble breathing. You feel confused. Summary CPAP and BIPAP are methods that use air pressure to keep your airways open and to help you breathe well. If you have trouble with the mask, it is very important that you talk with your health care provider about finding a way to make the mask easier to tolerate. Do not stop using the mask. There could be a negative impact to your health if you stop using the mask. Follow instructions from your health care provider about when to use the machine. This information is not intended to replace advice given to you by your health care provider. Make sure you discuss any questions you have with your health care provider. Document Revised: 04/21/2021 Document Reviewed: 08/21/2020 Elsevier Patient Education  2022 [employer].

## 2021-09-29 NOTE — Telephone Encounter (Signed)
error 

## 2021-09-29 NOTE — Progress Notes (Signed)
Reviewed and agree with assessment/plan.   Chesley Mires, MD [facility] Pulmonary/Critical Care 09/29/2021, 12:21 PM Pager:  [phone]

## 2021-09-29 NOTE — Progress Notes (Signed)
Virtual Visit via Video Note  I connected with Theodore Estes on 09/29/21 at 10:30 AM EST by a video enabled telemedicine application and verified that I am speaking with the correct person using two identifiers.  Location: Patient: Home/ work  Provider: Office   I discussed the limitations of evaluation and management by telemedicine and the availability of in person appointments. The patient expressed understanding and agreed to proceed.  History of Present Illness: 76 year old male, never smoked.  Past medical history significant for hypertension, diabetes, iron deficiency anemia.  Previous [facility] pulmonary encounter: 07/12/2021 Patient presents today for sleep consult.  He was referred by family medicine due to symptoms of snoring and fatigue. He does carry diagnosis of anemia and following with heme/onc. He is scheduled for iron transfusion Wednesday 07/14/21. No previous sleep studies. Typical bedtime is 10-11pm. He gets 6-7 hours of sleep a night, he wakes up frequently throughout the night. On average, he gets out of bed 4 times to use the restroom. He will wake up at 5am eat breakfast and go back to bed for another 30-43mins. It's hard for him to stay awake while driving. Denies sleep walking, narcolepsy or cataplexy.   Sleep questionnaire  Symptoms- Snoring, restless sleep, daytime sleepiness Prior sleep study- None  Bedtime-10-11 Time to fall asleep- <10 mins  Nocturnal awakenings- >5-10 times Out of bed in morning- 5am and 6:30am  Weight changes- No Epworth- 17/24  09/29/2021- Interim hx  Patient contacted today for virtual visit to review sleep study results. Patient has symptoms of snoring, restless sleep and daytime sleepiness. HST on 08/24/21 showed moderate OSA, AHI 22/7/hr with SpO2 low 83%. Reviewed sleep study results and discussed untreated sleep apnea increases patient risk for cardiac arrhythmias, pulmonary hypertension, stroke and diabetes. Treatment options include  weight loss, oral appliance, CPAP therapy or referral to ENT for possible surgical options.  Observations/Objective:  - Appears well; No shortness of breath, wheezing or cough  Assessment and Plan:  Moderate OSA: - HST on 08/24/21 showed moderate OSA, AHI 22/7/hr with SpO2 low 83% - Discussed risk of untreated sleep apnea and treatment options. He is open to starting CPAP therapy for treatment of moderate OSA. We will place an order for auto CPAP 5-15cm h20 with mask of choice - Advised patient wear CPAP every night for 4-6 hours or longer, encourage weight loss and recommend he avoid driving if experiencing excessive daytime sleepiness  Follow Up Instructions:  - 31-90 days after started CPAP therapy    I discussed the assessment and treatment plan with the patient. The patient was provided an opportunity to ask questions and all were answered. The patient agreed with the plan and demonstrated an understanding of the instructions.   The patient was advised to call back or seek an in-person evaluation if the symptoms worsen or if the condition fails to improve as anticipated.  I provided 22 minutes of non-face-to-face time during this encounter.   Martyn Ehrich, NP

## 2021-10-07 ENCOUNTER — Other Ambulatory Visit: Payer: Self-pay | Admitting: *Deleted

## 2021-10-07 DIAGNOSIS — D509 Iron deficiency anemia, unspecified: Secondary | ICD-10-CM

## 2021-10-07 DIAGNOSIS — D508 Other iron deficiency anemias: Secondary | ICD-10-CM

## 2021-10-12 ENCOUNTER — Other Ambulatory Visit: Payer: Self-pay

## 2021-10-12 ENCOUNTER — Inpatient Hospital Stay: Payer: PPO | Attending: Oncology

## 2021-10-12 DIAGNOSIS — K319 Disease of stomach and duodenum, unspecified: Secondary | ICD-10-CM | POA: Insufficient documentation

## 2021-10-12 DIAGNOSIS — E1122 Type 2 diabetes mellitus with diabetic chronic kidney disease: Secondary | ICD-10-CM | POA: Insufficient documentation

## 2021-10-12 DIAGNOSIS — R768 Other specified abnormal immunological findings in serum: Secondary | ICD-10-CM

## 2021-10-12 DIAGNOSIS — Z800 Family history of malignant neoplasm of digestive organs: Secondary | ICD-10-CM | POA: Diagnosis not present

## 2021-10-12 DIAGNOSIS — I129 Hypertensive chronic kidney disease with stage 1 through stage 4 chronic kidney disease, or unspecified chronic kidney disease: Secondary | ICD-10-CM | POA: Insufficient documentation

## 2021-10-12 DIAGNOSIS — N1831 Chronic kidney disease, stage 3a: Secondary | ICD-10-CM | POA: Diagnosis not present

## 2021-10-12 DIAGNOSIS — D509 Iron deficiency anemia, unspecified: Secondary | ICD-10-CM | POA: Insufficient documentation

## 2021-10-12 LAB — CBC WITH DIFFERENTIAL/PLATELET
Abs Immature Granulocytes: 0.01 10*3/uL (ref 0.00–0.07)
Basophils Absolute: 0.1 10*3/uL (ref 0.0–0.1)
Basophils Relative: 1 %
Eosinophils Absolute: 0.1 10*3/uL (ref 0.0–0.5)
Eosinophils Relative: 2 %
HCT: 36 % — ABNORMAL LOW (ref 39.0–52.0)
Hemoglobin: 12 g/dL — ABNORMAL LOW (ref 13.0–17.0)
Immature Granulocytes: 0 %
Lymphocytes Relative: 32 %
Lymphs Abs: 1.9 10*3/uL (ref 0.7–4.0)
MCH: 29.3 pg (ref 26.0–34.0)
MCHC: 33.3 g/dL (ref 30.0–36.0)
MCV: 87.8 fL (ref 80.0–100.0)
Monocytes Absolute: 0.7 10*3/uL (ref 0.1–1.0)
Monocytes Relative: 12 %
Neutro Abs: 3.1 10*3/uL (ref 1.7–7.7)
Neutrophils Relative %: 53 %
Platelets: 238 10*3/uL (ref 150–400)
RBC: 4.1 MIL/uL — ABNORMAL LOW (ref 4.22–5.81)
RDW: 14.1 % (ref 11.5–15.5)
WBC: 5.8 10*3/uL (ref 4.0–10.5)
nRBC: 0 % (ref 0.0–0.2)

## 2021-10-12 LAB — IRON AND TIBC
Iron: 65 ug/dL (ref 45–182)
Saturation Ratios: 16 % — ABNORMAL LOW (ref 17.9–39.5)
TIBC: 399 ug/dL (ref 250–450)
UIBC: 334 ug/dL

## 2021-10-12 LAB — FERRITIN: Ferritin: 200 ng/mL (ref 24–336)

## 2021-10-14 ENCOUNTER — Encounter: Payer: Self-pay | Admitting: Oncology

## 2021-10-14 ENCOUNTER — Inpatient Hospital Stay: Payer: PPO | Admitting: Oncology

## 2021-10-14 ENCOUNTER — Other Ambulatory Visit: Payer: Self-pay

## 2021-10-14 ENCOUNTER — Inpatient Hospital Stay: Payer: PPO

## 2021-10-14 VITALS — BP 146/94 | HR 80 | Temp 97.6°F | Wt 199.0 lb

## 2021-10-14 DIAGNOSIS — N1831 Chronic kidney disease, stage 3a: Secondary | ICD-10-CM

## 2021-10-14 DIAGNOSIS — D509 Iron deficiency anemia, unspecified: Secondary | ICD-10-CM

## 2021-10-14 DIAGNOSIS — D508 Other iron deficiency anemias: Secondary | ICD-10-CM

## 2021-10-14 MED ORDER — SODIUM CHLORIDE 0.9 % IV SOLN
Freq: Once | INTRAVENOUS | Status: AC
  Filled 2021-10-14: qty 250

## 2021-10-14 MED ORDER — SODIUM CHLORIDE 0.9 % IV SOLN: 200.0000 mg | Freq: Once | INTRAVENOUS | Status: DC

## 2021-10-14 MED ORDER — IRON SUCROSE 20 MG/ML IV SOLN
200.0000 mg | Freq: Once | INTRAVENOUS | Status: AC
  Administered 2021-10-14: 200 mg via INTRAVENOUS
  Filled 2021-10-14: qty 10

## 2021-10-14 NOTE — Progress Notes (Signed)
Hematology/Oncology Progress Note [phone] [phone]   Patient Care Team: Tonia Ghent, MD as PCP - General (Family Medicine) Minna Merritts, MD as Consulting Physician (Cardiology)  REFERRING PROVIDER: Tonia Ghent, MD  CHIEF COMPLAINTS/REASON FOR VISIT:  Follow up with anemia  HISTORY OF PRESENTING ILLNESS:   Theodore Estes is a  76 y.o.  male with PMH listed below was seen in consultation at the request of  Tonia Ghent, MD  for evaluation of anemia  06/03/2021 CBC showed hemoglobin 11.4, mcv 88.1, normal wbc, platelt.  Reviewed previous labs.  Anemia onset was since August 2022.  He denies any black or bloody stool. Last colonoscopy was on 01/09/2014.  Sessile polyp measuring 3 mm in size was found in the sigmoid colon.  Polyp was resected. Pathology showed benign colon mucosa with lymphoid aggregates.  Today patient was accompanied by his sister-in-law.  INTERVAL HISTORY Theodore Estes is a 76 y.o. male who has above history reviewed by me today presents for follow up visit for management of anemia He tolerated previous IV venofer treatments.  Accompanied by male friend.  No new complaints.  08/27/2021 colonoscopy showed  5 mm polyp in the descending colon, removed with a cold snare. Resected and retrieved.One 7 mm polyp at the splenic flexure, removed with a cold snare and biopsy forceps.Resected and retrieved.- Diverticulosis in the sigmoid colon and in the descending colon.  08/27/21 Upper endoscopy showed the examined portions of the nasopharynx, oropharynx and larynx were normal.- LA Grade A reflux esophagitis with no bleeding.- Gastroesophageal flap valve classified as Hill Grade IV (no fold, wide open lumen, hiatal hernia present). - 3 cm hiatal hernia.- Erosive gastropathy with scattered flecks of hematin. Biopsied. This could be a potential source of anemia.- Normal examined duodenum.  Patient finished a course of PPI.      Review of Systems  Constitutional:  Negative for appetite change, chills, fatigue, fever and unexpected weight change.  HENT:   Negative for hearing loss and voice change.   Eyes:  Negative for eye problems and icterus.  Respiratory:  Negative for chest tightness, cough and shortness of breath.   Cardiovascular:  Negative for chest pain and leg swelling.  Gastrointestinal:  Negative for abdominal distention and abdominal pain.  Endocrine: Negative for hot flashes.  Genitourinary:  Negative for difficulty urinating, dysuria and frequency.   Musculoskeletal:  Negative for arthralgias.  Skin:  Negative for itching and rash.  Neurological:  Negative for light-headedness and numbness.  Hematological:  Negative for adenopathy. Does not bruise/bleed easily.  Psychiatric/Behavioral:  Negative for confusion.    MEDICAL HISTORY:  Past Medical History:  Diagnosis Date   Anemia    Diabetes mellitus without complication (HCC)    Dyspnea    Erectile dysfunction    History of colonic polyps    Hyperlipidemia    Hypertension    Hypotestosteronism     SURGICAL HISTORY: Past Surgical History:  Procedure Laterality Date   COLONOSCOPY  08/18/2003   cyst excised from right wrist  2000   VASECTOMY  1977    SOCIAL HISTORY: Social History   Socioeconomic History   Marital status: Married    Spouse name: Not on file   Number of children: Not on file   Years of education: Not on file   Highest education level: Not on file  Occupational History   Not on file  Tobacco Use   Smoking status: Never   Smokeless tobacco: Never  Vaping Use   Vaping Use: Never used  Substance and Sexual Activity   Alcohol use: No    Alcohol/week: 0.0 standard drinks   Drug use: No   Sexual activity: Yes    Partners: Female  Other Topics Concern   Not on file  Social History Narrative   HSG, [employer]   Work: Sports coach   Married '69   2 daughters '76 '77'    5 grandchildren   Marriage in  good health   Social Determinants of Health   Financial Resource Strain: Low Risk    Difficulty of Paying Living Expenses: Not hard at all  Food Insecurity: No Food Insecurity   Worried About Charity fundraiser in the Last Year: Never true   [place] in the Last Year: Never true  Transportation Needs: No Transportation Needs   Lack of Transportation (Medical): No   Lack of Transportation (Non-Medical): No  Physical Activity: Inactive   Days of Exercise per Week: 0 days   Minutes of Exercise per Session: 0 min  Stress: No Stress Concern Present   Feeling of Stress : Only a little  Social Connections: Moderately Isolated   Frequency of Communication with Friends and Family: More than three times a week   Frequency of Social Gatherings with Friends and Family: More than three times a week   Attends Religious Services: Never   Marine scientist or Organizations: No   Attends Music therapist: Never   Marital Status: Married  Human resources officer Violence: Not At Risk   Fear of Current or Ex-Partner: No   Emotionally Abused: No   Physically Abused: No   Sexually Abused: No    FAMILY HISTORY: Family History  Problem Relation Age of Onset   Cancer Mother        colon   Colon cancer Mother 45   Heart disease Father    Hyperlipidemia Sister    Diabetes Brother    Hypertension Brother    Prostate cancer Neg Hx    Stomach cancer Neg Hx    Esophageal cancer Neg Hx     ALLERGIES:  has No Known Allergies.  MEDICATIONS:  Current Outpatient Medications  Medication Sig Dispense Refill   aspirin 81 MG tablet Take 81 mg by mouth daily.     Blood Glucose Monitoring Suppl (ONE TOUCH ULTRA 2) w/Device KIT Check blood sugar twice a day and as directed. Dx. E11.65 1 each 0   lovastatin (MEVACOR) 20 MG tablet TAKE 1 TABLET BY MOUTH ONCE A DAY 90 tablet 3   metFORMIN (GLUCOPHAGE) 1000 MG tablet Take 1 tablet (1,000 mg total) by mouth 2 (two) times daily with a meal.  180 tablet 3   omeprazole (PRILOSEC) 20 MG capsule Take 1 capsule (20 mg total) by mouth 2 (two) times daily before a meal. 60 capsule 1   ONETOUCH DELICA LANCETS 03T MISC Check blood sugar twice a day and as directed. Dx. E11.65 100 each 5   ONETOUCH ULTRA test strip USE TO CHECK BLOOD SUGARS TWICE DAILY ASDIRECTED. 100 each 11   pioglitazone (ACTOS) 15 MG tablet TAKE 1 TABLET BY MOUTH ONCE A DAY 90 tablet 3   minocycline (DYNACIN) 100 MG tablet Take 100 mg by mouth 2 (two) times daily. (Patient not taking: Reported on 09/29/2021)     Current Facility-Administered Medications  Medication Dose Route Frequency Provider Last Rate Last Admin   0.9 %  sodium chloride infusion  500  mL Intravenous Once Daryel November, MD         PHYSICAL EXAMINATION: ECOG PERFORMANCE STATUS: 0 - Asymptomatic Vitals:   10/14/21 1000  BP: (!) 146/94  Pulse: 80  Temp: 97.6 F (36.4 C)   Filed Weights   10/14/21 1000  Weight: 199 lb (90.3 kg)    Physical Exam Constitutional:      General: He is not in acute distress.    Appearance: He is obese.  HENT:     Head: Normocephalic and atraumatic.  Eyes:     General: No scleral icterus. Cardiovascular:     Rate and Rhythm: Normal rate and regular rhythm.     Heart sounds: Normal heart sounds.  Pulmonary:     Effort: Pulmonary effort is normal. No respiratory distress.     Breath sounds: No wheezing.  Abdominal:     General: Bowel sounds are normal. There is no distension.     Palpations: Abdomen is soft.  Musculoskeletal:        General: No deformity. Normal range of motion.     Cervical back: Normal range of motion and neck supple.  Skin:    General: Skin is warm and dry.     Findings: No erythema or rash.  Neurological:     Mental Status: He is alert and oriented to person, place, and time. Mental status is at baseline.     Cranial Nerves: No cranial nerve deficit.     Coordination: Coordination normal.  Psychiatric:        Mood and Affect:  Mood normal.    LABORATORY DATA:  I have reviewed the data as listed Lab Results  Component Value Date   WBC 5.8 10/12/2021   HGB 12.0 (L) 10/12/2021   HCT 36.0 (L) 10/12/2021   MCV 87.8 10/12/2021   PLT 238 10/12/2021   Recent Labs    04/26/21 0919 05/21/21 1056 06/03/21 1400  NA 141 140 140  K 5.2 No hemolysis seen* 5.0 4.4  CL 104 105 103  CO2 28 27 28   GLUCOSE 139* 112* 177*  BUN 25* 25* 25*  CREATININE 1.26 1.23 1.51*  CALCIUM 9.8 9.5 9.2  PROT 7.1 6.8  --   ALBUMIN 4.2 4.1  --   AST 20 18  --   ALT 22 20  --   ALKPHOS 116 118*  --   BILITOT 0.4 0.4  --     Iron/TIBC/Ferritin/ %Sat    Component Value Date/Time   IRON 65 10/12/2021 1007   TIBC 399 10/12/2021 1007   FERRITIN 200 10/12/2021 1007   IRONPCTSAT 16 (L) 10/12/2021 1007      RADIOGRAPHIC STUDIES: I have personally reviewed the radiological images as listed and agreed with the findings in the report. No results found.    ASSESSMENT & PLAN:  1. Iron deficiency anemia, unspecified iron deficiency anemia type   2. Stage 3a chronic kidney disease (HCC)    #Anemia, iron deficiency and may also have a component of anemia due to CKD.  Labs are reviewed and discussed with patient. Improved iron panel, still low iron saturation- 16.  In the context of CKD,  I recommend additional one dose of IV venofe 221m    # Gastropathy, finished a course of PPI.  Recommend him to follow up with GI. .   # Chronic kidney disease,stage 3 multiple myeloma panel showed negative M protein. Increased light chain ratio, negative 24 hour UPEP  Follow up in 6 months.  Orders Placed This Encounter  Procedures   CBC with Differential/Platelet    Standing Status:   Future    Standing Expiration Date:   10/14/2022   Ferritin    Standing Status:   Future    Standing Expiration Date:   10/14/2022   Iron and TIBC    Standing Status:   Future    Standing Expiration Date:   10/14/2022    All questions were answered.  The patient knows to call the clinic with any problems questions or concerns.  cc Tonia Ghent, MD    Earlie Server, MD, PhD  10/14/2021

## 2021-10-28 ENCOUNTER — Other Ambulatory Visit: Payer: Self-pay | Admitting: Family Medicine

## 2021-11-17 ENCOUNTER — Other Ambulatory Visit: Payer: Self-pay | Admitting: Family Medicine

## 2021-12-21 DIAGNOSIS — H2513 Age-related nuclear cataract, bilateral: Secondary | ICD-10-CM | POA: Diagnosis not present

## 2021-12-21 DIAGNOSIS — H35371 Puckering of macula, right eye: Secondary | ICD-10-CM | POA: Diagnosis not present

## 2021-12-21 DIAGNOSIS — H43811 Vitreous degeneration, right eye: Secondary | ICD-10-CM | POA: Diagnosis not present

## 2021-12-21 DIAGNOSIS — E119 Type 2 diabetes mellitus without complications: Secondary | ICD-10-CM | POA: Diagnosis not present

## 2021-12-21 LAB — HM DIABETES EYE EXAM

## 2021-12-23 ENCOUNTER — Telehealth: Payer: Self-pay | Admitting: Primary Care

## 2021-12-23 DIAGNOSIS — G4733 Obstructive sleep apnea (adult) (pediatric): Secondary | ICD-10-CM

## 2021-12-23 NOTE — Telephone Encounter (Signed)
It does not appear that cpap order was placed during last OV. ? ?Spoke to patient, who is following up on order. He is aware that order has now been placed.  ?He will call back once setup on cpap to schedule OV.  ?Nothing further needed.  ? ?

## 2022-01-31 ENCOUNTER — Telehealth: Payer: Self-pay | Admitting: Family Medicine

## 2022-01-31 ENCOUNTER — Encounter: Payer: Self-pay | Admitting: Family Medicine

## 2022-01-31 ENCOUNTER — Ambulatory Visit (INDEPENDENT_AMBULATORY_CARE_PROVIDER_SITE_OTHER): Payer: PPO | Admitting: Family Medicine

## 2022-01-31 ENCOUNTER — Ambulatory Visit (INDEPENDENT_AMBULATORY_CARE_PROVIDER_SITE_OTHER)
Admission: RE | Admit: 2022-01-31 | Discharge: 2022-01-31 | Disposition: A | Discharge status: Home / Self Care | Payer: PPO | Source: Ambulatory Visit | Attending: Family Medicine | Admitting: Family Medicine

## 2022-01-31 VITALS — BP 122/80 | HR 89 | Temp 97.9°F | Ht 66.0 in | Wt 198.0 lb

## 2022-01-31 DIAGNOSIS — D649 Anemia, unspecified: Secondary | ICD-10-CM | POA: Diagnosis not present

## 2022-01-31 DIAGNOSIS — E782 Mixed hyperlipidemia: Secondary | ICD-10-CM | POA: Diagnosis not present

## 2022-01-31 DIAGNOSIS — Z0000 Encounter for general adult medical examination without abnormal findings: Secondary | ICD-10-CM

## 2022-01-31 DIAGNOSIS — M5432 Sciatica, left side: Secondary | ICD-10-CM

## 2022-01-31 DIAGNOSIS — M545 Low back pain, unspecified: Secondary | ICD-10-CM | POA: Diagnosis not present

## 2022-01-31 DIAGNOSIS — E119 Type 2 diabetes mellitus without complications: Secondary | ICD-10-CM

## 2022-01-31 DIAGNOSIS — R5383 Other fatigue: Secondary | ICD-10-CM | POA: Diagnosis not present

## 2022-01-31 DIAGNOSIS — R0602 Shortness of breath: Secondary | ICD-10-CM

## 2022-01-31 DIAGNOSIS — Z7189 Other specified counseling: Secondary | ICD-10-CM

## 2022-01-31 LAB — CBC WITH DIFFERENTIAL/PLATELET
Basophils Absolute: 0.1 10*3/uL (ref 0.0–0.1)
Basophils Relative: 0.8 % (ref 0.0–3.0)
Eosinophils Absolute: 0.1 10*3/uL (ref 0.0–0.7)
Eosinophils Relative: 2 % (ref 0.0–5.0)
HCT: 38.4 % — ABNORMAL LOW (ref 39.0–52.0)
Hemoglobin: 12.6 g/dL — ABNORMAL LOW (ref 13.0–17.0)
Lymphocytes Relative: 28.4 % (ref 12.0–46.0)
Lymphs Abs: 1.9 10*3/uL (ref 0.7–4.0)
MCHC: 32.9 g/dL (ref 30.0–36.0)
MCV: 88.8 fl (ref 78.0–100.0)
Monocytes Absolute: 0.7 10*3/uL (ref 0.1–1.0)
Monocytes Relative: 9.7 % (ref 3.0–12.0)
Neutro Abs: 4 10*3/uL (ref 1.4–7.7)
Neutrophils Relative %: 59.1 % (ref 43.0–77.0)
Platelets: 242 10*3/uL (ref 150.0–400.0)
RBC: 4.32 Mil/uL (ref 4.22–5.81)
RDW: 13.9 % (ref 11.5–15.5)
WBC: 6.8 10*3/uL (ref 4.0–10.5)

## 2022-01-31 LAB — LIPID PANEL
Cholesterol: 162 mg/dL (ref 0–200)
HDL: 61.8 mg/dL (ref >39.00)
LDL Cholesterol: 77 mg/dL (ref 0–99)
NonHDL: 100.49
Total CHOL/HDL Ratio: 3
Triglycerides: 118 mg/dL (ref 0.0–149.0)
VLDL: 23.6 mg/dL (ref 0.0–40.0)

## 2022-01-31 LAB — COMPREHENSIVE METABOLIC PANEL
ALT: 26 U/L (ref 0–53)
AST: 19 U/L (ref 0–37)
Albumin: 4.4 g/dL (ref 3.5–5.2)
Alkaline Phosphatase: 108 U/L (ref 39–117)
BUN: 29 mg/dL — ABNORMAL HIGH (ref 6–23)
CO2: 29 mEq/L (ref 19–32)
Calcium: 9.4 mg/dL (ref 8.4–10.5)
Chloride: 103 mEq/L (ref 96–112)
Creatinine, Ser: 1.33 mg/dL (ref 0.40–1.50)
GFR: 52.32 mL/min — ABNORMAL LOW (ref >60.00)
Glucose, Bld: 129 mg/dL — ABNORMAL HIGH (ref 70–99)
Potassium: 4.6 mEq/L (ref 3.5–5.1)
Sodium: 140 mEq/L (ref 135–145)
Total Bilirubin: 0.5 mg/dL (ref 0.2–1.2)
Total Protein: 7 g/dL (ref 6.0–8.3)

## 2022-01-31 LAB — IRON: Iron: 88 ug/dL (ref 42–165)

## 2022-01-31 LAB — HEMOGLOBIN A1C: Hgb A1c MFr Bld: 8.3 % — ABNORMAL HIGH (ref 4.6–6.5)

## 2022-01-31 MED ORDER — OMEPRAZOLE 20 MG PO CPDR: 20.0000 mg | DELAYED_RELEASE_CAPSULE | Freq: Every day | ORAL | Status: DC

## 2022-01-31 NOTE — Telephone Encounter (Signed)
This patient couldn't get set up with CPAP.  Does he need another order from you?  Please have your staff contact him.  Thank you for your help.   ?

## 2022-01-31 NOTE — Patient Instructions (Signed)
Go to the lab on the way out.   If you have mychart we'll likely use that to update you.    ?Take care.  Glad to see you. ?Check your med list at home.  ?Plan on recheck in about 6 months re: diabetes.   ?

## 2022-01-31 NOTE — Telephone Encounter (Signed)
Pt saw Geraldo Pitter on 09/29/21.  Order for CPAP placed on 12/23/21 with [facility]. ? ?Will route to PCC's to follow up on status of CPAP order. ?

## 2022-01-31 NOTE — Progress Notes (Signed)
Diabetes:  ?Using medications without difficulties: yes ?Hypoglycemic episodes:no ?Hyperglycemic episodes:no ?Feet problems:no ?Blood Sugars averaging: 160s usually, occ higher.  Diet has been less strict.   ?eye exam within last year:yes ?Labs pending.   ? ?Elevated Cholesterol: ?Using medications without problems: yes ?Muscle aches: he has lower back pain, can radiate into the L leg, posterior.  Can radiate down to the foot.  Not having diffuse aches o/w.  No foot drop.  Sx going on for ~1 year, possibly longer.   ?Diet compliance: d/w pt.   ?Exercise: d/w pt.  ?Labs pending.   ? ?Fatigue noted.  He is off testosterone.  Fatigue noted in the last year.  H/o IDA and had hematology f/u in the meantime.  SOB on exertion.  No CP.  He couldn't get CPAP set up.  Note sent to Dr. Halford Chessman about follow up.  I need pulmonary input. ? ?Flu prev done at pharmacy ?Shingles 2020 ?PNA up-to-date ?covid 2021 ?Tetanus 2013, d/w pt.   ?Colonoscopy 2022 ?Prostate cancer screening 2022 ?Advance directive wife designated if patient were incapacitated. ? ?PMH and SH reviewed ? ?Meds, vitals, and allergies reviewed.  ? ?ROS: Per HPI unless specifically indicated in ROS section  ? ?GEN: nad, alert and oriented ?HEENT: ncat ?NECK: supple w/o LA ?CV: rrr. ?PULM: ctab, no inc wob ?ABD: soft, +bs ?EXT: no edema ?SKIN: no acute rash ?Back not ttp but L SLR mildly positive.  Able to bear weight.   ? ?Diabetic foot exam: ?Normal inspection ?No skin breakdown ?No calluses  ?Normal DP pulses ?Normal sensation to light touch and monofilament ?Nails normal ?

## 2022-02-01 LAB — FERRITIN: Ferritin: 247.2 ng/mL (ref 22.0–322.0)

## 2022-02-01 LAB — BRAIN NATRIURETIC PEPTIDE: Pro B Natriuretic peptide (BNP): 12 pg/mL (ref 0.0–100.0)

## 2022-02-01 LAB — TSH: TSH: 4.12 u[IU]/mL (ref 0.35–5.50)

## 2022-02-01 NOTE — Telephone Encounter (Signed)
I have resent message to [facility] for them to check on this issue ?

## 2022-02-02 DIAGNOSIS — D649 Anemia, unspecified: Secondary | ICD-10-CM | POA: Insufficient documentation

## 2022-02-02 DIAGNOSIS — D631 Anemia in chronic kidney disease: Secondary | ICD-10-CM | POA: Insufficient documentation

## 2022-02-02 NOTE — Assessment & Plan Note (Signed)
History of.  See notes on labs. 

## 2022-02-02 NOTE — Assessment & Plan Note (Signed)
See notes on labs.  Continue lovastatin.  It does not appear that his muscle aches/sciatica pain would be statin related. ?

## 2022-02-02 NOTE — Assessment & Plan Note (Signed)
Noted in the last year.  History of iron deficiency anemia and previous hematology evaluation.  Check BNP given shortness of breath on exertion.  He is not having chest pain.  Unclear if symptoms are related to being off CPAP and I sent a note to pulmonary for input.  See notes on labs.  Still okay for outpatient follow-up.  Not in distress at time of exam.  Lungs are clear. ?

## 2022-02-02 NOTE — Assessment & Plan Note (Signed)
See notes on labs.  Recheck periodically.  Continue metformin and Actos. ?

## 2022-02-02 NOTE — Assessment & Plan Note (Signed)
Advance directive- wife designated if patient were incapacitated.  

## 2022-02-02 NOTE — Assessment & Plan Note (Signed)
Flu prev done at pharmacy ?Shingles 2020 ?PNA up-to-date ?covid 2021 ?Tetanus 2013, d/w pt.   ?Colonoscopy 2022 ?Prostate cancer screening 2022 ?Advance directive wife designated if patient were incapacitated. ?

## 2022-02-02 NOTE — Assessment & Plan Note (Signed)
Back not tender but left straight leg raise mildly positive.  Able to bear weight.  See notes on imaging.  Still okay for outpatient follow-up. ?

## 2022-02-04 NOTE — Telephone Encounter (Signed)
Just about that original order not getting placed, please keep me updated on CPAP

## 2022-02-17 DIAGNOSIS — G4733 Obstructive sleep apnea (adult) (pediatric): Secondary | ICD-10-CM | POA: Diagnosis not present

## 2022-03-01 ENCOUNTER — Telehealth: Payer: Self-pay | Admitting: Primary Care

## 2022-03-01 DIAGNOSIS — G4733 Obstructive sleep apnea (adult) (pediatric): Secondary | ICD-10-CM | POA: Diagnosis not present

## 2022-03-01 NOTE — Telephone Encounter (Signed)
Called and spoke with pt who states while he is wearing the CPAP, he feels like he is not able to get enough air and wonders if the settings might need to be changed. States this has been going on for a little over a week.  Pt states that he has not been able to wear his mask; states last time he wore it was about two nights. Pt states that each night he wears it, he is not able to wear it all night due to how it makes him feel.   Pt wants to know what might be recommended to help him out. Beth, please advise. Download printed.

## 2022-03-01 NOTE — Telephone Encounter (Signed)
Download 01/27/22-02/25/22 showed pressure 5-15cm h20 with residual AHI 11.2 (14.5cm h20-95%).   Please adjust pressure 10-20cm h20

## 2022-03-01 NOTE — Telephone Encounter (Signed)
Called and spoke with pt letting him know info per BW and he verbalized understanding. Order sent to [facility] for pt's cpap settings to be adjusted. Nothing further needed.

## 2022-03-20 DIAGNOSIS — G4733 Obstructive sleep apnea (adult) (pediatric): Secondary | ICD-10-CM | POA: Diagnosis not present

## 2022-03-31 DIAGNOSIS — G4733 Obstructive sleep apnea (adult) (pediatric): Secondary | ICD-10-CM | POA: Diagnosis not present

## 2022-04-11 ENCOUNTER — Inpatient Hospital Stay: Payer: PPO | Attending: Oncology

## 2022-04-11 DIAGNOSIS — D508 Other iron deficiency anemias: Secondary | ICD-10-CM | POA: Diagnosis not present

## 2022-04-11 DIAGNOSIS — D509 Iron deficiency anemia, unspecified: Secondary | ICD-10-CM

## 2022-04-11 LAB — CBC WITH DIFFERENTIAL/PLATELET
Abs Immature Granulocytes: 0.03 10*3/uL (ref 0.00–0.07)
Basophils Absolute: 0.1 10*3/uL (ref 0.0–0.1)
Basophils Relative: 1 %
Eosinophils Absolute: 0.2 10*3/uL (ref 0.0–0.5)
Eosinophils Relative: 2 %
HCT: 36.5 % — ABNORMAL LOW (ref 39.0–52.0)
Hemoglobin: 11.9 g/dL — ABNORMAL LOW (ref 13.0–17.0)
Immature Granulocytes: 0 %
Lymphocytes Relative: 26 %
Lymphs Abs: 1.9 10*3/uL (ref 0.7–4.0)
MCH: 29.4 pg (ref 26.0–34.0)
MCHC: 32.6 g/dL (ref 30.0–36.0)
MCV: 90.1 fL (ref 80.0–100.0)
Monocytes Absolute: 0.7 10*3/uL (ref 0.1–1.0)
Monocytes Relative: 9 %
Neutro Abs: 4.5 10*3/uL (ref 1.7–7.7)
Neutrophils Relative %: 62 %
Platelets: 240 10*3/uL (ref 150–400)
RBC: 4.05 MIL/uL — ABNORMAL LOW (ref 4.22–5.81)
RDW: 13.7 % (ref 11.5–15.5)
WBC: 7.3 10*3/uL (ref 4.0–10.5)
nRBC: 0 % (ref 0.0–0.2)

## 2022-04-11 LAB — FERRITIN: Ferritin: 210 ng/mL (ref 24–336)

## 2022-04-11 LAB — IRON AND TIBC
Iron: 74 ug/dL (ref 45–182)
Saturation Ratios: 19 % (ref 17.9–39.5)
TIBC: 396 ug/dL (ref 250–450)
UIBC: 322 ug/dL

## 2022-04-12 MED FILL — Iron Sucrose Inj 20 MG/ML (Fe Equiv): INTRAVENOUS | Qty: 10 | Status: AC

## 2022-04-13 ENCOUNTER — Inpatient Hospital Stay: Payer: PPO | Admitting: Oncology

## 2022-04-13 ENCOUNTER — Inpatient Hospital Stay: Payer: PPO

## 2022-04-13 ENCOUNTER — Encounter: Payer: Self-pay | Admitting: Oncology

## 2022-04-13 VITALS — BP 139/92 | HR 80 | Temp 96.4°F | Resp 16 | Ht 66.0 in | Wt 202.0 lb

## 2022-04-13 DIAGNOSIS — D508 Other iron deficiency anemias: Secondary | ICD-10-CM | POA: Diagnosis not present

## 2022-04-13 NOTE — Progress Notes (Signed)
Hematology/Oncology Progress Note [phone] [phone]   Patient Care Team: Tonia Ghent, MD as PCP - General (Family Medicine) Rockey Situ Kathlene November, MD as Consulting Physician (Cardiology) Anell Barr, [place] (Optometry)  REFERRING PROVIDER: Tonia Ghent, MD  CHIEF COMPLAINTS/REASON FOR VISIT:  Follow up with anemia  HISTORY OF PRESENTING ILLNESS:   Theodore Estes is a  76 y.o.  male with PMH listed below was seen in consultation at the request of  Tonia Ghent, MD  for evaluation of anemia  06/03/2021 CBC showed hemoglobin 11.4, mcv 88.1, normal wbc, platelt.  Reviewed previous labs.  Anemia onset was since August 2022.  He denies any black or bloody stool. Last colonoscopy was on 01/09/2014.  Sessile polyp measuring 3 mm in size was found in the sigmoid colon.  Polyp was resected. Pathology showed benign colon mucosa with lymphoid aggregates.  08/27/2021 colonoscopy showed  5 mm polyp in the descending colon, removed with a cold snare. Resected and retrieved.One 7 mm polyp at the splenic flexure, removed with a cold snare and biopsy forceps.Resected and retrieved.- Diverticulosis in the sigmoid colon and in the descending colon.  08/27/21 Upper endoscopy showed the examined portions of the nasopharynx, oropharynx and larynx were normal.- LA Grade A reflux esophagitis with no bleeding.- Gastroesophageal flap valve classified as Hill Grade IV (no fold, wide open lumen, hiatal hernia present). - 3 cm hiatal hernia.- Erosive gastropathy with scattered flecks of hematin. Biopsied. This could be a potential source of anemia.- Normal examined duodenum.  Patient finished a course of PPI.   INTERVAL HISTORY Theodore Estes is a 76 y.o. male who has above history reviewed by me today presents for follow up visit for management of anemia He feels well. No new complaints.    Review of Systems  Constitutional:  Negative for appetite change, chills, fatigue,  fever and unexpected weight change.  HENT:   Negative for hearing loss and voice change.   Eyes:  Negative for eye problems and icterus.  Respiratory:  Negative for chest tightness, cough and shortness of breath.   Cardiovascular:  Negative for chest pain and leg swelling.  Gastrointestinal:  Negative for abdominal distention and abdominal pain.  Endocrine: Negative for hot flashes.  Genitourinary:  Negative for difficulty urinating, dysuria and frequency.   Musculoskeletal:  Negative for arthralgias.  Skin:  Negative for itching and rash.  Neurological:  Negative for light-headedness and numbness.  Hematological:  Negative for adenopathy. Does not bruise/bleed easily.  Psychiatric/Behavioral:  Negative for confusion.     MEDICAL HISTORY:  Past Medical History:  Diagnosis Date   Anemia    Diabetes mellitus without complication (HCC)    Dyspnea    Erectile dysfunction    History of colonic polyps    Hyperlipidemia    Hypertension    Hypotestosteronism     SURGICAL HISTORY: Past Surgical History:  Procedure Laterality Date   COLONOSCOPY  08/18/2003   cyst excised from right wrist  2000   VASECTOMY  1977    SOCIAL HISTORY: Social History   Socioeconomic History   Marital status: Married    Spouse name: Not on file   Number of children: Not on file   Years of education: Not on file   Highest education level: Not on file  Occupational History   Not on file  Tobacco Use   Smoking status: Never   Smokeless tobacco: Never  Vaping Use   Vaping Use: Never used  Substance and  Sexual Activity   Alcohol use: No    Alcohol/week: 0.0 standard drinks of alcohol   Drug use: No   Sexual activity: Yes    Partners: Female  Other Topics Concern   Not on file  Social History Narrative   HSG, [employer]   Work: Sports coach   Married '69   2 daughters '76 '77   5 grandchildren   Marriage in good health   Social Determinants of Health   Financial Resource Strain: [place]  (09/28/2021)   Overall Financial Resource Strain (CARDIA)    Difficulty of Paying Living Expenses: Not hard at all  Food Insecurity: No Food Insecurity (09/28/2021)   Hunger Vital Sign    Worried About Running Out of Food in the Last Year: Never true    [place] in the Last Year: Never true  Transportation Needs: No Transportation Needs (09/28/2021)   PRAPARE - Hydrologist (Medical): No    Lack of Transportation (Non-Medical): No  Physical Activity: Inactive (09/28/2021)   Exercise Vital Sign    Days of Exercise per Week: 0 days    Minutes of Exercise per Session: 0 min  Stress: No Stress Concern Present (09/28/2021)   [place]    Feeling of Stress : Only a little  Social Connections: Moderately Isolated (09/28/2021)   Social Connection and Isolation Panel [NHANES]    Frequency of Communication with Friends and Family: More than three times a week    Frequency of Social Gatherings with Friends and Family: More than three times a week    Attends Religious Services: Never    Marine scientist or Organizations: No    Attends Archivist Meetings: Never    Marital Status: Married  Human resources officer Violence: Not At Risk (09/28/2021)   Humiliation, Afraid, Rape, and Kick questionnaire    Fear of Current or Ex-Partner: No    Emotionally Abused: No    Physically Abused: No    Sexually Abused: No    FAMILY HISTORY: Family History  Problem Relation Age of Onset   Cancer Mother        colon   Colon cancer Mother 60   Heart disease Father    Hyperlipidemia Sister    Diabetes Brother    Hypertension Brother    Prostate cancer Neg Hx    Stomach cancer Neg Hx    Esophageal cancer Neg Hx     ALLERGIES:  has No Known Allergies.  MEDICATIONS:  Current Outpatient Medications  Medication Sig Dispense Refill   aspirin 81 MG tablet Take 81 mg by mouth daily.     Blood Glucose  Monitoring Suppl (ONE TOUCH ULTRA 2) w/Device KIT Check blood sugar twice a day and as directed. Dx. E11.65 1 each 0   cyanocobalamin 1000 MCG tablet Take 1,000 mcg by mouth daily.     lovastatin (MEVACOR) 20 MG tablet TAKE 1 TABLET BY MOUTH ONCE A DAY 90 tablet 3   metFORMIN (GLUCOPHAGE) 1000 MG tablet TAKE 1 TABLET BY MOUTH TWICE A DAY WITH A MEAL 180 tablet 3   omeprazole (PRILOSEC) 20 MG capsule Take 1 capsule (20 mg total) by mouth daily.     ONETOUCH DELICA LANCETS 31S MISC Check blood sugar twice a day and as directed. Dx. E11.65 100 each 5   ONETOUCH ULTRA test strip USE TO CHECK BLOOD SUGARS TWICE DAILY ASDIRECTED. 100 each 11  pioglitazone (ACTOS) 15 MG tablet TAKE 1 TABLET BY MOUTH ONCE A DAY 90 tablet 3   No current facility-administered medications for this visit.     PHYSICAL EXAMINATION: ECOG PERFORMANCE STATUS: 0 - Asymptomatic Vitals:   04/13/22 1028  BP: (!) 139/92  Pulse: 80  Resp: 16  Temp: (!) 96.4 F (35.8 C)  SpO2: 99%   Filed Weights   04/13/22 1028  Weight: 202 lb (91.6 kg)    Physical Exam Constitutional:      General: He is not in acute distress.    Appearance: He is obese.  HENT:     Head: Normocephalic and atraumatic.  Eyes:     General: No scleral icterus. Cardiovascular:     Rate and Rhythm: Normal rate and regular rhythm.     Heart sounds: Normal heart sounds.  Pulmonary:     Effort: Pulmonary effort is normal. No respiratory distress.     Breath sounds: No wheezing.  Abdominal:     General: Bowel sounds are normal. There is no distension.     Palpations: Abdomen is soft.  Musculoskeletal:        General: No deformity. Normal range of motion.     Cervical back: Normal range of motion and neck supple.  Skin:    General: Skin is warm and dry.     Findings: No erythema or rash.  Neurological:     Mental Status: He is alert and oriented to person, place, and time. Mental status is at baseline.     Cranial Nerves: No cranial nerve  deficit.     Coordination: Coordination normal.  Psychiatric:        Mood and Affect: Mood normal.     LABORATORY DATA:  I have reviewed the data as listed Lab Results  Component Value Date   WBC 7.3 04/11/2022   HGB 11.9 (L) 04/11/2022   HCT 36.5 (L) 04/11/2022   MCV 90.1 04/11/2022   PLT 240 04/11/2022   Recent Labs    04/26/21 0919 05/21/21 1056 06/03/21 1400 01/31/22 1032  NA 141 140 140 140  K 5.2 No hemolysis seen* 5.0 4.4 4.6  CL 104 105 103 103  CO2 28 27 28 29   GLUCOSE 139* 112* 177* 129*  BUN 25* 25* 25* 29*  CREATININE 1.26 1.23 1.51* 1.33  CALCIUM 9.8 9.5 9.2 9.4  PROT 7.1 6.8  --  7.0  ALBUMIN 4.2 4.1  --  4.4  AST 20 18  --  19  ALT 22 20  --  26  ALKPHOS 116 118*  --  108  BILITOT 0.4 0.4  --  0.5    Iron/TIBC/Ferritin/ %Sat    Component Value Date/Time   IRON 74 04/11/2022 1103   TIBC 396 04/11/2022 1103   FERRITIN 210 04/11/2022 1103   IRONPCTSAT 19 04/11/2022 1103      RADIOGRAPHIC STUDIES: I have personally reviewed the radiological images as listed and agreed with the findings in the report. No results found.    ASSESSMENT & PLAN:  1. Other iron deficiency anemia    #Anemia, iron deficiency and may also have a component of anemia due to CKD.  Labs are reviewed and discussed with patient. Improved iron panel, normal iron saturation- 19.  Ferritin is 210.  Hold off IV Venofer.   # Chronic kidney disease,stage 3 multiple myeloma panel showed negative M protein. Increased light chain ratio, negative 24 hour UPEP  Follow up in 6 months.  Orders Placed This  Encounter  Procedures   CBC with Differential/Platelet    Standing Status:   Future    Standing Expiration Date:   04/14/2023   Comprehensive metabolic panel    Standing Status:   Future    Standing Expiration Date:   04/14/2023   Iron and TIBC([facility])    Standing Status:   Future    Standing Expiration Date:   04/14/2023   Ferritin    Standing Status:   Future     Standing Expiration Date:   04/14/2023    All questions were answered. The patient knows to call the clinic with any problems questions or concerns.  cc Tonia Ghent, MD    Earlie Server, MD, PhD  04/13/2022

## 2022-04-18 DIAGNOSIS — G4733 Obstructive sleep apnea (adult) (pediatric): Secondary | ICD-10-CM | POA: Diagnosis not present

## 2022-04-19 DIAGNOSIS — G4733 Obstructive sleep apnea (adult) (pediatric): Secondary | ICD-10-CM | POA: Diagnosis not present

## 2022-05-01 DIAGNOSIS — G4733 Obstructive sleep apnea (adult) (pediatric): Secondary | ICD-10-CM | POA: Diagnosis not present

## 2022-05-20 DIAGNOSIS — G4733 Obstructive sleep apnea (adult) (pediatric): Secondary | ICD-10-CM | POA: Diagnosis not present

## 2022-05-24 DIAGNOSIS — H43811 Vitreous degeneration, right eye: Secondary | ICD-10-CM | POA: Diagnosis not present

## 2022-05-24 DIAGNOSIS — H2513 Age-related nuclear cataract, bilateral: Secondary | ICD-10-CM | POA: Diagnosis not present

## 2022-05-24 DIAGNOSIS — E119 Type 2 diabetes mellitus without complications: Secondary | ICD-10-CM | POA: Diagnosis not present

## 2022-05-24 DIAGNOSIS — H35371 Puckering of macula, right eye: Secondary | ICD-10-CM | POA: Diagnosis not present

## 2022-05-27 ENCOUNTER — Ambulatory Visit: Payer: PPO | Admitting: Primary Care

## 2022-05-27 ENCOUNTER — Encounter: Payer: Self-pay | Admitting: Primary Care

## 2022-05-27 VITALS — BP 124/74 | HR 83 | Temp 98.0°F | Ht 66.0 in | Wt 200.6 lb

## 2022-05-27 DIAGNOSIS — G4733 Obstructive sleep apnea (adult) (pediatric): Secondary | ICD-10-CM | POA: Diagnosis not present

## 2022-05-27 NOTE — Patient Instructions (Addendum)
Excellent compliance with CPAP You are still having some residual apneas Recommend increasing CPAP pressure  Continue to aim to wear CPAP every night for minimum of 4 to 6 hours or longer Do not drive if experiencing excessive daytime sleepiness or fatigue Maintain normal BMI range Focus on side sleeping position or elevate head 30 degrees with wedge pillow Make sure you are changing CPAP supplies every 3 months  Orders Please adjust auto CPAP pressures to 10 to 20 cm H2O Please provide patient with nasal pillow mask   Follow-up 6 months with Beth NP or sooner if needed   CPAP and BIPAP Information CPAP and BIPAP are methods that use air pressure to keep your airways open and to help you breathe well. CPAP and BIPAP use different amounts of pressure. Your health care provider will tell you whether CPAP or BIPAP would be more helpful for you. CPAP stands for "continuous positive airway pressure." With CPAP, the amount of pressure stays the same while you breathe in (inhale) and out (exhale). BIPAP stands for "bi-level positive airway pressure." With BIPAP, the amount of pressure will be higher when you inhale and lower when you exhale. This allows you to take larger breaths. CPAP or BIPAP may be used in the hospital, or your health care provider may want you to use it at home. You may need to have a sleep study before your health care provider can order a machine for you to use at home. What are the advantages? CPAP or BIPAP can be helpful if you have: Sleep apnea. Chronic obstructive pulmonary disease (COPD). Heart failure. Medical conditions that cause muscle weakness, including muscular dystrophy or amyotrophic lateral sclerosis (ALS). Other problems that cause breathing to be shallow, weak, abnormal, or difficult. CPAP and BIPAP are most commonly used for obstructive sleep apnea (OSA) to keep the airways from collapsing when the muscles relax during sleep. What are the  risks? Generally, this is a safe treatment. However, problems may occur, including: Irritated skin or skin sores if the mask does not fit properly. Dry or stuffy nose or nosebleeds. Dry mouth. Feeling gassy or bloated. Sinus or lung infection if the equipment is not cleaned properly. When should CPAP or BIPAP be used? In most cases, the mask only needs to be worn during sleep. Generally, the mask needs to be worn throughout the night and during any daytime naps. People with certain medical conditions may also need to wear the mask at other times, such as when they are awake. Follow instructions from your health care provider about when to use the machine. What happens during CPAP or BIPAP?  Both CPAP and BIPAP are provided by a small machine with a flexible plastic tube that attaches to a plastic mask that you wear. Air is blown through the mask into your nose or mouth. The amount of pressure that is used to blow the air can be adjusted on the machine. Your health care provider will set the pressure setting and help you find the best mask for you. Tips for using the mask Because the mask needs to be snug, some people feel trapped or closed-in (claustrophobic) when first using the mask. If you feel this way, you may need to get used to the mask. One way to do this is to hold the mask loosely over your nose or mouth and then gradually apply the mask more snugly. You can also gradually increase the amount of time that you use the mask. Masks are available  in various types and sizes. If your mask does not fit well, talk with your health care provider about getting a different one. Some common types of masks include: Full face masks, which fit over the mouth and nose. Nasal masks, which fit over the nose. Nasal pillow or prong masks, which fit into the nostrils. If you are using a mask that fits over your nose and you tend to breathe through your mouth, a chin strap may be applied to help keep your  mouth closed. Use a skin barrier to protect your skin as told by your health care provider. Some CPAP and BIPAP machines have alarms that may sound if the mask comes off or develops a leak. If you have trouble with the mask, it is very important that you talk with your health care provider about finding a way to make the mask easier to tolerate. Do not stop using the mask. There could be a negative impact on your health if you stop using the mask. Tips for using the machine Place your CPAP or BIPAP machine on a secure table or stand near an electrical outlet. Know where the on/off switch is on the machine. Follow instructions from your health care provider about how to set the pressure on your machine and when you should use it. Do not eat or drink while the CPAP or BIPAP machine is on. Food or fluids could get pushed into your lungs by the pressure of the CPAP or BIPAP. For home use, CPAP and BIPAP machines can be rented or purchased through home health care companies. Many different brands of machines are available. Renting a machine before purchasing may help you find out which particular machine works well for you. Your health insurance company may also decide which machine you may get. Keep the CPAP or BIPAP machine and attachments clean. Ask your health care provider for specific instructions. Check the humidifier if you have a dry stuffy nose or nosebleeds. Make sure it is working correctly. Follow these instructions at home: Take over-the-counter and prescription medicines only as told by your health care provider. Ask if you can take sinus medicine if your sinuses are blocked. Do not use any products that contain nicotine or tobacco. These products include cigarettes, chewing tobacco, and vaping devices, such as e-cigarettes. If you need help quitting, ask your health care provider. Keep all follow-up visits. This is important. Contact a health care provider if: You have redness or pressure  sores on your head, face, mouth, or nose from the mask or head gear. You have trouble using the CPAP or BIPAP machine. You cannot tolerate wearing the CPAP or BIPAP mask. Someone tells you that you snore even when wearing your CPAP or BIPAP. Get help right away if: You have trouble breathing. You feel confused. Summary CPAP and BIPAP are methods that use air pressure to keep your airways open and to help you breathe well. If you have trouble with the mask, it is very important that you talk with your health care provider about finding a way to make the mask easier to tolerate. Do not stop using the mask. There could be a negative impact to your health if you stop using the mask. Follow instructions from your health care provider about when to use the machine. This information is not intended to replace advice given to you by your health care provider. Make sure you discuss any questions you have with your health care provider. Document Revised: 04/21/2021 Document Reviewed:  08/21/2020 Elsevier Patient Education  [place].

## 2022-05-27 NOTE — Progress Notes (Signed)
@Patient  ID: Theodore Estes, male    DOB: [birth year], 76 y.o.   MRN: [id number]  Chief Complaint  Patient presents with   Follow-up    Wearing cpap avg 7hr nightly. Would like to try nasal pillows.    Referring provider: Tonia Ghent, MD  HPI: 76 year old male, never smoked.  Past medical history significant for hypertension, diabetes, iron deficiency anemia.  Previous [facility] pulmonary encounter: 07/12/2021 Patient presents today for sleep consult.  He was referred by family medicine due to symptoms of snoring and fatigue. He does carry diagnosis of anemia and following with heme/onc. He is scheduled for iron transfusion Wednesday 07/14/21. No previous sleep studies. Typical bedtime is 10-11pm. He gets 6-7 hours of sleep a night, he wakes up frequently throughout the night. On average, he gets out of bed 4 times to use the restroom. He will wake up at 5am eat breakfast and go back to bed for another 30-97mns. It's hard for him to stay awake while driving. Denies sleep walking, narcolepsy or cataplexy.   Sleep questionnaire  Symptoms- Snoring, restless sleep, daytime sleepiness Prior sleep study- None  Bedtime-10-11 Time to fall asleep- <10 mins  Nocturnal awakenings- >5-10 times Out of bed in morning- 5am and 6:30am  Weight changes- No Epworth- 17/24  09/29/2021 Patient contacted today for virtual visit to review sleep study results. Patient has symptoms of snoring, restless sleep and daytime sleepiness. HST on 08/24/21 showed moderate OSA, AHI 22/7/hr with SpO2 low 83%. Reviewed sleep study results and discussed untreated sleep apnea increases patient risk for cardiac arrhythmias, pulmonary hypertension, stroke and diabetes. Treatment options include weight loss, oral appliance, CPAP therapy or referral to ENT for possible surgical options.   05/27/2022 Patient presents today for CPAP compliance.  Home sleep study in November 2022 showed moderate obstructive sleep apnea, AHI 22.7 an  hour.  He was started on auto CPAP.  Patient is 100% compliant with use, using on average 5 hours and 32 minutes a night.  Current CPAP pressure 5 to 15 cm H2O (18.7 cm H2O-95th percentile).  He is still having some residual apneas.  His sleep is gradually getting better He is having some trouble with his mask He interested in trying a nasal pillow mask Bedtime is between 10-11pm He wakes up on average once a night around 3am to use the restroom He starts his day around 5am Epworth score 9   Airview download 04/27/2022 - 05/26/2022 Usage days 30/30 (100%); 27 days (90%) greater than 4 hours Average usage 5 hours 32 minutes Pressure 5 to 15 cm H2O (18.7 cm H2O-95%) Air leaks 10.7 L/min (95%) AHI 9.6    No Known Allergies  Immunization History  Administered Date(s) Administered   Fluad Quad(high Dose 65+) 06/06/2019   Influenza Inj Mdck Quad Pf 08/19/2017   Influenza Whole 06/26/2008, 06/26/2009, 10/19/2011   Influenza, Seasonal, Injecte, Preservative Fre 07/05/2016   Influenza,inj,Quad PF,6+ Mos 07/04/2014, 08/19/2015   Influenza-Unspecified 07/27/2013, 09/03/2018, 06/26/2020   Moderna Sars-Covid-2 Vaccination 11/01/2019, 11/27/2019, 07/27/2020   Pneumococcal Conjugate-13 02/11/2015   Pneumococcal Polysaccharide-23 01/27/2012   Tdap 01/27/2012   Zoster Recombinat (Shingrix) 09/03/2018, 12/17/2018   Zoster, Live 06/26/2008    Past Medical History:  Diagnosis Date   Anemia    Diabetes mellitus without complication ([place]    Dyspnea    Erectile dysfunction    History of colonic polyps    Hyperlipidemia    Hypertension    Hypotestosteronism     Tobacco History: Social History  Tobacco Use  Smoking Status Never  Smokeless Tobacco Never   Counseling given: Not Answered   Outpatient Medications Prior to Visit  Medication Sig Dispense Refill   aspirin 81 MG tablet Take 81 mg by mouth daily.     Blood Glucose Monitoring Suppl (ONE TOUCH ULTRA 2) w/Device KIT Check  blood sugar twice a day and as directed. Dx. E11.65 1 each 0   cyanocobalamin 1000 MCG tablet Take 1,000 mcg by mouth daily.     lovastatin (MEVACOR) 20 MG tablet TAKE 1 TABLET BY MOUTH ONCE A DAY 90 tablet 3   metFORMIN (GLUCOPHAGE) 1000 MG tablet TAKE 1 TABLET BY MOUTH TWICE A DAY WITH A MEAL 180 tablet 3   omeprazole (PRILOSEC) 20 MG capsule Take 1 capsule (20 mg total) by mouth daily.     ONETOUCH DELICA LANCETS 32R MISC Check blood sugar twice a day and as directed. Dx. E11.65 100 each 5   ONETOUCH ULTRA test strip USE TO CHECK BLOOD SUGARS TWICE DAILY ASDIRECTED. 100 each 11   pioglitazone (ACTOS) 15 MG tablet TAKE 1 TABLET BY MOUTH ONCE A DAY 90 tablet 3   No facility-administered medications prior to visit.   Review of Systems  Review of Systems  Constitutional:  Positive for fatigue.  HENT: Negative.    Respiratory: Negative.    Psychiatric/Behavioral:  Negative for sleep disturbance.     Physical Exam  BP 124/74 (BP Location: Left Arm, Cuff Size: Normal)   Pulse 83   Temp 98 F (36.7 C) (Temporal)   Ht 5' 6"  (1.676 m)   Wt 200 lb 9.6 oz (91 kg)   SpO2 98%   BMI 32.38 kg/m  Physical Exam Constitutional:      Appearance: Normal appearance.  HENT:     Head: Normocephalic and atraumatic.     Mouth/Throat:     Mouth: Mucous membranes are moist.     Pharynx: Oropharynx is clear.  Cardiovascular:     Rate and Rhythm: Normal rate and regular rhythm.  Pulmonary:     Effort: Pulmonary effort is normal.     Breath sounds: Normal breath sounds.  Musculoskeletal:        General: Normal range of motion.  Skin:    General: Skin is warm and dry.  Neurological:     General: No focal deficit present.     Mental Status: He is alert and oriented to person, place, and time. Mental status is at baseline.  Psychiatric:        Mood and Affect: Mood normal.        Behavior: Behavior normal.        Thought Content: Thought content normal.        Judgment: Judgment normal.       Lab Results:  CBC    Component Value Date/Time   WBC 7.3 04/11/2022 1103   RBC 4.05 (L) 04/11/2022 1103   HGB 11.9 (L) 04/11/2022 1103   HCT 36.5 (L) 04/11/2022 1103   PLT 240 04/11/2022 1103   MCV 90.1 04/11/2022 1103   MCH 29.4 04/11/2022 1103   MCHC 32.6 04/11/2022 1103   RDW 13.7 04/11/2022 1103   LYMPHSABS 1.9 04/11/2022 1103   MONOABS 0.7 04/11/2022 1103   EOSABS 0.2 04/11/2022 1103   BASOSABS 0.1 04/11/2022 1103    BMET    Component Value Date/Time   NA 140 01/31/2022 1032   K 4.6 01/31/2022 1032   CL 103 01/31/2022 1032   CO2 29 01/31/2022  1032   GLUCOSE 129 (H) 01/31/2022 1032   BUN 29 (H) 01/31/2022 1032   CREATININE 1.33 01/31/2022 1032   CALCIUM 9.4 01/31/2022 1032   GFRNONAA 71.83 10/19/2009 0859   GFRAA 88 03/25/2008 0955    BNP No results found for: "BNP"  ProBNP    Component Value Date/Time   PROBNP 12.0 01/31/2022 1032    Imaging: No results found.   Assessment & Plan:   Moderate obstructive sleep apnea - HST 11/29.22 showed moderate OSA, AHI 22.7/hour. Started on auto CPAP. He is 100% compliant with use and reports benefit in sleep quality and slight improvement in energy level. Epworth score improved from 17/24 to 9/24. Current pressure settings 5-15cm h20 with residual AHI 9.6/hr. Recommend changing pressure 10-20cm h20. He would like to try nasal mask. FU in 6 months or sooner if needed.     Martyn Ehrich, NP 05/27/2022

## 2022-05-27 NOTE — Assessment & Plan Note (Addendum)
-   HST 08/24/21 showed moderate OSA, AHI 22.7/hour. Started on auto CPAP. He is 100% compliant with use last 30 days and reports benefit in sleep quality and slight improvement in energy level. Epworth score improved from 17/24 to 9/24. Current pressure settings 5-15cm h20 with residual AHI 9.6/hr. Recommend changing pressure 10-20cm h20. He would like to try nasal mask. FU in 6 months or sooner if needed.

## 2022-06-06 DIAGNOSIS — G4733 Obstructive sleep apnea (adult) (pediatric): Secondary | ICD-10-CM | POA: Diagnosis not present

## 2022-06-20 DIAGNOSIS — G4733 Obstructive sleep apnea (adult) (pediatric): Secondary | ICD-10-CM | POA: Diagnosis not present

## 2022-06-29 DIAGNOSIS — G4733 Obstructive sleep apnea (adult) (pediatric): Secondary | ICD-10-CM | POA: Diagnosis not present

## 2022-07-20 DIAGNOSIS — G4733 Obstructive sleep apnea (adult) (pediatric): Secondary | ICD-10-CM | POA: Diagnosis not present

## 2022-08-01 DIAGNOSIS — G4733 Obstructive sleep apnea (adult) (pediatric): Secondary | ICD-10-CM | POA: Diagnosis not present

## 2022-08-04 DIAGNOSIS — D225 Melanocytic nevi of trunk: Secondary | ICD-10-CM | POA: Diagnosis not present

## 2022-08-04 DIAGNOSIS — B9689 Other specified bacterial agents as the cause of diseases classified elsewhere: Secondary | ICD-10-CM | POA: Diagnosis not present

## 2022-08-04 DIAGNOSIS — L570 Actinic keratosis: Secondary | ICD-10-CM | POA: Diagnosis not present

## 2022-08-04 DIAGNOSIS — L0202 Furuncle of face: Secondary | ICD-10-CM | POA: Diagnosis not present

## 2022-08-04 DIAGNOSIS — X32XXXD Exposure to sunlight, subsequent encounter: Secondary | ICD-10-CM | POA: Diagnosis not present

## 2022-08-19 DIAGNOSIS — G4733 Obstructive sleep apnea (adult) (pediatric): Secondary | ICD-10-CM | POA: Diagnosis not present

## 2022-08-20 DIAGNOSIS — G4733 Obstructive sleep apnea (adult) (pediatric): Secondary | ICD-10-CM | POA: Diagnosis not present

## 2022-08-31 DIAGNOSIS — G4733 Obstructive sleep apnea (adult) (pediatric): Secondary | ICD-10-CM | POA: Diagnosis not present

## 2022-09-07 ENCOUNTER — Other Ambulatory Visit: Payer: Self-pay | Admitting: Family Medicine

## 2022-09-08 DIAGNOSIS — G4733 Obstructive sleep apnea (adult) (pediatric): Secondary | ICD-10-CM | POA: Diagnosis not present

## 2022-09-11 ENCOUNTER — Ambulatory Visit (INDEPENDENT_AMBULATORY_CARE_PROVIDER_SITE_OTHER): Payer: PPO

## 2022-09-11 ENCOUNTER — Ambulatory Visit
Admission: EM | Admit: 2022-09-11 | Discharge: 2022-09-11 | Disposition: A | Discharge status: Home / Self Care | Payer: PPO | Attending: Emergency Medicine | Admitting: Emergency Medicine

## 2022-09-11 DIAGNOSIS — R058 Other specified cough: Secondary | ICD-10-CM | POA: Diagnosis not present

## 2022-09-11 DIAGNOSIS — J0100 Acute maxillary sinusitis, unspecified: Secondary | ICD-10-CM | POA: Diagnosis not present

## 2022-09-11 DIAGNOSIS — J209 Acute bronchitis, unspecified: Secondary | ICD-10-CM

## 2022-09-11 DIAGNOSIS — R059 Cough, unspecified: Secondary | ICD-10-CM | POA: Diagnosis not present

## 2022-09-11 MED ORDER — AZITHROMYCIN 250 MG PO TABS: 250.0000 mg | ORAL_TABLET | Freq: Every day | ORAL | 0 refills | Status: DC

## 2022-09-11 NOTE — ED Triage Notes (Addendum)
Patient to Urgent Care with complaints of chest congestion. Also reports productive cough. Reports his wife took his temperature and his temp was 102 approx 3 days ago. Denies any other known fevers.  Symptoms started five days ago. Negative covid swab at home.   Has been taking mucinex.

## 2022-09-11 NOTE — ED Provider Notes (Signed)
Theodore Estes    CSN: [id number] Arrival date & time: 09/11/22  1478      History   Chief Complaint Chief Complaint  Patient presents with   URI    HPI Theodore Estes is a 76 y.o. male.  Patient presents with 5-day history of chest congestion and productive cough.  He reports mild shortness of breath.  He had a fever of 102 three days ago but none since.  Treating symptoms at home with Mucinex; no OTC medications today.  He denies sore throat, chest pain, vomiting, diarrhea, or other symptoms.  His medical history includes hypertension, diabetes, iron deficiency anemia, sleep apnea.  The history is provided by the patient and medical records.    Past Medical History:  Diagnosis Date   Anemia    Diabetes mellitus without complication ([place])    Dyspnea    Erectile dysfunction    History of colonic polyps    Hyperlipidemia    Hypertension    Hypotestosteronism     Patient Active Problem List   Diagnosis Date Noted   Moderate obstructive sleep apnea 05/27/2022   Anemia 02/02/2022   Snoring 07/12/2021   Iron deficiency anemia 06/23/2021   Lightheadedness 05/21/2021   Nocturia 05/21/2021   Abnormal CBC 05/21/2021   Fatigue 05/21/2021   SOBOE (shortness of breath on exertion) 04/28/2021   Healthcare maintenance 08/26/2020   Creatinine elevation 08/26/2020   Sciatica 02/11/2020   Insomnia 08/26/2019   Tennis elbow 05/23/2019   Shoulder pain 05/23/2019   Advance care planning 02/12/2015   Right knee pain 02/12/2015   Olecranon bursitis 03/19/2014   Medicare annual wellness visit, subsequent 01/29/2012   Hypogonadism in male 08/26/2010   ERECTILE DYSFUNCTION, ORGANIC 10/15/2009   Diabetes mellitus without complication ([place]) 29/56/2130   HLD (hyperlipidemia) 03/26/2008   Essential hypertension 03/26/2008   COLONIC POLYPS, HX OF 03/26/2008    Past Surgical History:  Procedure Laterality Date   COLONOSCOPY  08/18/2003   cyst excised from right wrist  [place] Medications    Prior to Admission medications   Medication Sig Start Date End Date Taking? Authorizing Provider  azithromycin (ZITHROMAX) 250 MG tablet Take 1 tablet (250 mg total) by mouth daily. Take first 2 tablets together, then 1 every day until finished. 09/11/22  Yes Sharion Balloon, NP  aspirin 81 MG tablet Take 81 mg by mouth daily.    [provider]  Blood Glucose Monitoring Suppl (ONE TOUCH ULTRA 2) w/Device KIT Check blood sugar twice a day and as directed. Dx. E11.65 05/31/18   Tonia Ghent, MD  cyanocobalamin 1000 MCG tablet Take 1,000 mcg by mouth daily.    [provider]  lovastatin (MEVACOR) 20 MG tablet TAKE 1 TABLET BY MOUTH ONCE A DAY 09/07/22   Tonia Ghent, MD  metFORMIN (GLUCOPHAGE) 1000 MG tablet TAKE 1 TABLET BY MOUTH TWICE A DAY WITH A MEAL 10/29/21   Tonia Ghent, MD  omeprazole (PRILOSEC) 20 MG capsule Take 1 capsule (20 mg total) by mouth daily. 01/31/22   Tonia Ghent, MD  [facility] DELICA LANCETS 86V MISC Check blood sugar twice a day and as directed. Dx. E11.65 12/04/15   Tonia Ghent, MD  [facility] ULTRA test strip USE TO CHECK BLOOD SUGARS TWICE DAILY ASDIRECTED. 11/18/21   Tonia Ghent, MD  pioglitazone (ACTOS) 15 MG tablet TAKE 1 TABLET BY MOUTH ONCE A DAY 09/07/22  Tonia Ghent, MD    Family History Family History  Problem Relation Age of Onset   Cancer Mother        colon   Colon cancer Mother 80   Heart disease Father    Hyperlipidemia Sister    Diabetes Brother    Hypertension Brother    Prostate cancer Neg Hx    Stomach cancer Neg Hx    Esophageal cancer Neg Hx     Social History Social History   Tobacco Use   Smoking status: Never   Smokeless tobacco: Never  Vaping Use   Vaping Use: Never used  Substance Use Topics   Alcohol use: No    Alcohol/week: 0.0 standard drinks of alcohol   Drug use: No     Allergies   Patient has no known allergies.   Review of  Systems Review of Systems  Constitutional:  Positive for fever. Negative for chills.  HENT:  Positive for congestion. Negative for ear pain and sore throat.   Respiratory:  Positive for cough and shortness of breath.   Cardiovascular:  Negative for chest pain and palpitations.  Gastrointestinal:  Negative for diarrhea and vomiting.  Skin:  Negative for color change and rash.  All other systems reviewed and are negative.    Physical Exam Triage Vital Signs ED Triage Vitals  Enc Vitals Group     BP      Pulse      Resp      Temp      Temp src      SpO2      Weight      Height      Head Circumference      Peak Flow      Pain Score      Pain Loc      Pain Edu?      Excl. in [place]?    No data found.  Updated Vital Signs BP 122/80   Pulse (!) 115   Temp (!) 97.5 F (36.4 C)   Resp 18   Ht _0  (1.676 m)   Wt 200 lb (90.7 kg)   SpO2 96%   BMI 32.28 kg/m   Visual Acuity Right Eye Distance:   Left Eye Distance:   Bilateral Distance:    Right Eye Near:   Left Eye Near:    Bilateral Near:     Physical Exam Vitals and nursing note reviewed.  Constitutional:      General: He is not in acute distress.    Appearance: Normal appearance. He is well-developed. He is not ill-appearing.  HENT:     Right Ear: Tympanic membrane normal.     Left Ear: Tympanic membrane normal.     Nose: Nose normal.     Mouth/Throat:     Mouth: Mucous membranes are moist.     Pharynx: Oropharynx is clear.     Comments: Clear PND. Cardiovascular:     Rate and Rhythm: Normal rate and regular rhythm.     Heart sounds: Normal heart sounds. No murmur heard. Pulmonary:     Effort: Pulmonary effort is normal. No respiratory distress.     Breath sounds: Rhonchi present.     Comments: Scattered rhonchi which clear with cough. Musculoskeletal:     Cervical back: Neck supple.  Skin:    General: Skin is warm and dry.  Neurological:     Mental Status: He is alert.  Psychiatric:  Mood  and Affect: Mood normal.        Behavior: Behavior normal.      UC Treatments / Results  Labs (all labs ordered are listed, but only abnormal results are displayed) Labs Reviewed - No data to display  EKG   Radiology DG Chest 2 View  Result Date: 09/11/2022 CLINICAL DATA:  Erect cough EXAM: CHEST - 2 VIEW COMPARISON:  05/06/2021 FINDINGS: The heart size and mediastinal contours are within normal limits. No focal airspace consolidation, pleural effusion, or pneumothorax. The visualized skeletal structures are unremarkable. IMPRESSION: No active cardiopulmonary disease. Electronically Signed   By: Davina Poke D.O.   On: 09/11/2022 09:01    Procedures Procedures (including critical care time)  Medications Ordered in UC Medications - No data to display  Initial Impression / Assessment and Plan / UC Course  I have reviewed the triage vital signs and the nursing notes.  Pertinent labs & imaging results that were available during my care of the patient were reviewed by me and considered in my medical decision making (see chart for details).    Productive cough, acute bronchitis, acute sinusitis.  CXR negative.  No respiratory distress, O2 sat 96% on room air.  Treating with Zithromax.  Education provided on bronchitis and sinusitis.  Instructed patient to follow-up with his PCP.  He agrees to plan of care.  Final Clinical Impressions(s) / UC Diagnoses   Final diagnoses:  Productive cough  Acute bronchitis, unspecified organism  Acute non-recurrent maxillary sinusitis     Discharge Instructions      Take the Zithromax as directed.  Follow up with your primary care provider.        ED Prescriptions     Medication Sig Dispense Auth. Provider   azithromycin (ZITHROMAX) 250 MG tablet Take 1 tablet (250 mg total) by mouth daily. Take first 2 tablets together, then 1 every day until finished. 6 tablet Sharion Balloon, NP      PDMP not reviewed this encounter.   Sharion Balloon, NP 09/11/22 [phone]

## 2022-09-11 NOTE — Discharge Instructions (Addendum)
Take the Zithromax as directed.  Follow up with your primary care provider.    

## 2022-09-19 DIAGNOSIS — G4733 Obstructive sleep apnea (adult) (pediatric): Secondary | ICD-10-CM | POA: Diagnosis not present

## 2022-09-30 ENCOUNTER — Ambulatory Visit (INDEPENDENT_AMBULATORY_CARE_PROVIDER_SITE_OTHER): Payer: PPO

## 2022-09-30 VITALS — Ht 66.0 in | Wt 200.0 lb

## 2022-09-30 DIAGNOSIS — Z0000 Encounter for general adult medical examination without abnormal findings: Secondary | ICD-10-CM | POA: Diagnosis not present

## 2022-09-30 NOTE — Progress Notes (Signed)
Subjective:   Theodore Estes is a 77 y.o. male who presents for Medicare Annual/Subsequent preventive examination.  Review of Systems    Virtual Visit via Telephone Note  I connected with  [name] on 09/30/22 at 10:00 AM EST by telephone and verified that I am speaking with the correct person using two identifiers.  Location: Patient: Home Provider: Office Persons participating in the virtual visit: patient/Nurse Health Advisor   I discussed the limitations, risks, security and privacy concerns of performing an evaluation and management service by telephone and the availability of in person appointments. The patient expressed understanding and agreed to proceed.  Interactive audio and video telecommunications were attempted between this nurse and patient, however failed, due to patient having technical difficulties OR patient did not have access to video capability.  We continued and completed visit with audio only.  Some vital signs may be absent or patient reported.   Criselda Peaches, LPN  Cardiac Risk Factors include: advanced age (>58mn, >>3women);diabetes mellitus;male gender     Objective:    Today's Vitals   09/30/22 1008 09/30/22 1009  Weight: 200 lb (90.7 kg)   Height: '5\' 6"'$  (1.676 m)   PainSc:  0-No pain   Body mass index is 32.28 kg/m.     09/30/2022   10:14 AM 09/11/2022    8:26 AM 04/13/2022   10:33 AM 09/28/2021    1:21 PM 06/23/2021    9:36 AM 04/27/2018    9:23 AM 04/04/2016    8:50 AM  Advanced Directives  Does Patient Have a Medical Advance Directive? Yes Yes No Yes Yes Yes Yes  Type of AParamedicof APetermanLiving will Living will  H[place]amooseLiving will Living will HNoblesLiving will Living will  Does patient want to make changes to medical advance directive?    Yes (MAU/Ambulatory/Procedural Areas - Information given)   No - Patient declined  Copy of [place] in Chart? No - copy requested     No - copy requested No - copy requested  Would patient like information on creating a medical advance directive?   No - Patient declined        Current Medications (verified) Outpatient Encounter Medications as of 09/30/2022  Medication Sig   aspirin 81 MG tablet Take 81 mg by mouth daily.   azithromycin (ZITHROMAX) 250 MG tablet Take 1 tablet (250 mg total) by mouth daily. Take first 2 tablets together, then 1 every day until finished.   Blood Glucose Monitoring Suppl (ONE TOUCH ULTRA 2) w/Device KIT Check blood sugar twice a day and as directed. Dx. E11.65   cyanocobalamin 1000 MCG tablet Take 1,000 mcg by mouth daily.   lovastatin (MEVACOR) 20 MG tablet TAKE 1 TABLET BY MOUTH ONCE A DAY   metFORMIN (GLUCOPHAGE) 1000 MG tablet TAKE 1 TABLET BY MOUTH TWICE A DAY WITH A MEAL   omeprazole (PRILOSEC) 20 MG capsule Take 1 capsule (20 mg total) by mouth daily.   ONETOUCH DELICA LANCETS 328BMISC Check blood sugar twice a day and as directed. Dx. E11.65   ONETOUCH ULTRA test strip USE TO CHECK BLOOD SUGARS TWICE DAILY ASDIRECTED.   pioglitazone (ACTOS) 15 MG tablet TAKE 1 TABLET BY MOUTH ONCE A DAY   No facility-administered encounter medications on file as of 09/30/2022.    Allergies (verified) Patient has no known allergies.   History: Past Medical History:  Diagnosis Date   Anemia  Diabetes mellitus without complication (HCC)    Dyspnea    Erectile dysfunction    History of colonic polyps    Hyperlipidemia    Hypertension    Hypotestosteronism    Past Surgical History:  Procedure Laterality Date   COLONOSCOPY  08/18/2003   cyst excised from right wrist  2000   VASECTOMY  1977   Family History  Problem Relation Age of Onset   Cancer Mother        colon   Colon cancer Mother 22   Heart disease Father    Hyperlipidemia Sister    Diabetes Brother    Hypertension Brother    Prostate cancer Neg Hx    Stomach cancer Neg Hx    Esophageal  cancer Neg Hx    Social History   Socioeconomic History   Marital status: Married    Spouse name: Not on file   Number of children: Not on file   Years of education: Not on file   Highest education level: Not on file  Occupational History   Not on file  Tobacco Use   Smoking status: Never   Smokeless tobacco: Never  Vaping Use   Vaping Use: Never used  Substance and Sexual Activity   Alcohol use: No    Alcohol/week: 0.0 standard drinks of alcohol   Drug use: No   Sexual activity: Yes    Partners: Female  Other Topics Concern   Not on file  Social History Narrative   HSG, [employer]   Work: Sports coach   Married '69   2 daughters '76 '77   5 grandchildren   Marriage in good health   Social Determinants of Health   Financial Resource Strain: [place]  (09/30/2022)   Overall Financial Resource Strain (CARDIA)    Difficulty of Paying Living Expenses: Not hard at all  Food Insecurity: No Food Insecurity (09/30/2022)   Hunger Vital Sign    Worried About Running Out of Food in the Last Year: Never true    [place] in the Last Year: Never true  Transportation Needs: No Transportation Needs (09/30/2022)   PRAPARE - Hydrologist (Medical): No    Lack of Transportation (Non-Medical): No  Physical Activity: Inactive (09/30/2022)   Exercise Vital Sign    Days of Exercise per Week: 0 days    Minutes of Exercise per Session: 0 min  Stress: No Stress Concern Present (09/30/2022)   [place]    Feeling of Stress : Not at all  Social Connections: [place] (09/30/2022)   Social Connection and Isolation Panel [NHANES]    Frequency of Communication with Friends and Family: More than three times a week    Frequency of Social Gatherings with Friends and Family: More than three times a week    Attends Religious Services: More than 4 times per year    Active Member of [employer] or  Organizations: Yes    Attends Music therapist: More than 4 times per year    Marital Status: Married    Tobacco Counseling Counseling given: Not Answered   Clinical Intake:  Pre-visit preparation completed: No  Pain : No/denies pain Pain Score: 0-No pain   Nutrition Risk Assessment:  Has the patient had any N/V/D within the last 2 months?  No  Does the patient have any non-healing wounds?  No  Has the patient had any unintentional weight loss  or weight gain?  No   Diabetes:  Is the patient diabetic?  Yes  If diabetic, was a CBG obtained today?  Yes CBG 196 Taken by patient Did the patient bring in their glucometer from home?  No  How often do you monitor your CBG's? Daily.   Financial Strains and Diabetes Management:  Are you having any financial strains with the device, your supplies or your medication? No .  Does the patient want to be seen by Chronic Care Management for management of their diabetes?  No  Would the patient like to be referred to a Nutritionist or for Diabetic Management?  No   Diabetic Exams:  Diabetic Eye Exam: Completed No. Overdue for diabetic eye exam. Pt has been advised about the importance in completing this exam. A referral has been placed today. Message sent to referral coordinator for scheduling purposes. Advised pt to expect a call from office referred to regarding appt.  Diabetic Foot Exam: Completed No. Pt has been advised about the importance in completing this exam. Pt is scheduled for diabetic foot exam on Followed by PCP.    BMI - recorded: 32.28 Nutritional Status: BMI > 30  Obese Nutritional Risks: None Diabetes: Yes CBG done?: Yes (CBG 196 Taken by patient) CBG resulted in Enter/ Edit results?: Yes Did pt. bring in CBG monitor from home?: No  How often do you need to have someone help you when you read instructions, pamphlets, or other written materials from your doctor or pharmacy?: 1 - Never  Diabetic?   Yes  Interpreter Needed?: No  Information entered by :: Rolene Arbour LPN   Activities of Daily Living    09/30/2022   10:14 AM  In your present state of health, do you have any difficulty performing the following activities:  Hearing? 0  Vision? 0  Difficulty concentrating or making decisions? 0  Walking or climbing stairs? 0  Dressing or bathing? 0  Doing errands, shopping? 0  Preparing Food and eating ? N  Using the Toilet? N  In the past six months, have you accidently leaked urine? N  Do you have problems with loss of bowel control? N  Managing your Medications? N  Managing your Finances? N  Housekeeping or managing your Housekeeping? N    Patient Care Team: Tonia Ghent, MD as PCP - General (Family Medicine) Minna Merritts, MD as Consulting Physician (Cardiology) Anell Barr, OD (Optometry)  Indicate any recent Medical Services you may have received from other than [facility] providers in the past year (date may be approximate).     Assessment:   This is a routine wellness examination for Theodore Estes.  Hearing/Vision screen Hearing Screening - Comments:: Denies hearing difficulties   Vision Screening - Comments:: Wears rx glasses - up to date with routine eye exams with  Dr Ellin Mayhew  Dietary issues and exercise activities discussed: Exercise limited by: None identified   Goals Addressed               This Visit's Progress     No current goals (pt-stated)         Depression Screen    09/30/2022   10:13 AM 09/28/2021    1:26 PM 08/24/2020   11:14 AM 05/20/2019    8:54 AM 04/27/2018    9:22 AM 04/04/2016    8:50 AM 02/11/2015    5:19 PM  PHQ 2/9 Scores  PHQ - 2 Score 0 0 0 0 0 0 0  PHQ- 9  Score     0      Fall Risk    09/30/2022   10:14 AM 09/28/2021    1:24 PM 08/24/2020   11:14 AM 05/20/2019    8:54 AM 04/27/2018    9:22 AM  Fall Risk   Falls in the past year? 0 0 0 0 No  Number falls in past yr: 0 0 0    Injury with Fall? 0 0 0    Risk for fall  due to : No Fall Risks No Fall Risks     Follow up Falls prevention discussed Falls prevention discussed Falls evaluation completed      FALL RISK PREVENTION PERTAINING TO THE HOME:  Any stairs in or around the home? Yes  If so, are there any without handrails? No  Home free of loose throw rugs in walkways, pet beds, electrical cords, etc? Yes  Adequate lighting in your home to reduce risk of falls? Yes   ASSISTIVE DEVICES UTILIZED TO PREVENT FALLS:  Life alert? No  Use of a cane, walker or w/c? No  Grab bars in the bathroom? No  Shower chair or bench in shower? Yes  Elevated toilet seat or a handicapped toilet? No   TIMED UP AND GO:  Was the test performed? No . Audio Visit   Cognitive Function:    04/27/2018    9:22 AM 04/04/2016    8:59 AM  MMSE - Mini Mental State Exam  Orientation to time 5 5  Orientation to Place 5 5  Registration 3 3  Attention/ Calculation 0 0  Recall 2 3  Recall-comments unable to recall 1 of 3 words   Language- name 2 objects 0 0  Language- repeat 1 1  Language- follow 3 step command 3 3  Language- read & follow direction 0 0  Write a sentence 0 0  Copy design 0 0  Total score 19 20        09/30/2022   10:15 AM  6CIT Screen  What Year? 0 points  What month? 0 points  What time? 0 points  Count back from 20 0 points  Months in reverse 0 points  Repeat phrase 0 points  Total Score 0 points    Immunizations Immunization History  Administered Date(s) Administered   Fluad Quad(high Dose 65+) 06/06/2019   Influenza Inj Mdck Quad Pf 08/19/2017   Influenza Whole 06/26/2008, 06/26/2009, 10/19/2011   Influenza, Seasonal, Injecte, Preservative Fre 07/05/2016   Influenza,inj,Quad PF,6+ Mos 07/04/2014, 08/19/2015   Influenza-Unspecified 07/27/2013, 09/03/2018, 06/26/2020   Moderna Sars-Covid-2 Vaccination 11/01/2019, 11/27/2019, 07/27/2020   Pneumococcal Conjugate-13 02/11/2015   Pneumococcal Polysaccharide-23 01/27/2012   Tdap 01/27/2012    Zoster Recombinat (Shingrix) 09/03/2018, 12/17/2018   Zoster, Live 06/26/2008    TDAP status: Due, Education has been provided regarding the importance of this vaccine. Advised may receive this vaccine at local pharmacy or Health Dept. Aware to provide a copy of the vaccination record if obtained from local pharmacy or Health Dept. Verbalized acceptance and understanding.    Pneumococcal vaccine status: Up to date  Covid-19 vaccine status: Completed vaccines  Qualifies for Shingles Vaccine? Yes   Zostavax completed Yes   Shingrix Completed?: Yes  Screening Tests Health Maintenance  Topic Date Due   Diabetic kidney evaluation - Urine ACR  Never done   DTaP/Tdap/Td (2 - Td or Tdap) 01/26/2022   HEMOGLOBIN A1C  08/03/2022   COVID-19 Vaccine (4 - 2023-24 season) 10/16/2022 (Originally 05/27/2022)   INFLUENZA VACCINE  12/25/2022 (Originally 04/26/2022)   OPHTHALMOLOGY EXAM  12/22/2022   Diabetic kidney evaluation - eGFR measurement  02/01/2023   FOOT EXAM  02/01/2023   Medicare Annual Wellness (AWV)  10/01/2023   Pneumonia Vaccine 60+ Years old  Completed   Hepatitis C Screening  Completed   Zoster Vaccines- Shingrix  Completed   HPV VACCINES  Aged Out   COLONOSCOPY (Pts 45-7yr Insurance coverage will need to be confirmed)  Discontinued    Health Maintenance  Health Maintenance Due  Topic Date Due   Diabetic kidney evaluation - Urine ACR  Never done   DTaP/Tdap/Td (2 - Td or Tdap) 01/26/2022   HEMOGLOBIN A1C  08/03/2022    Colorectal cancer screening: No longer required.   Lung Cancer Screening: (Low Dose CT Chest recommended if Age 77-80years, 30 pack-year currently smoking OR have quit w/in 15years.) does not qualify.     Additional Screening:  Hepatitis C Screening: does qualify; Completed 04/05/16  Vision Screening: Recommended annual ophthalmology exams for early detection of glaucoma and other disorders of the eye. Is the patient up to date with their annual  eye exam?  Yes  Who is the provider or what is the name of the office in which the patient attends annual eye exams? Dr WEllin MayhewIf pt is not established with a provider, would they like to be referred to a provider to establish care? No .   Dental Screening: Recommended annual dental exams for proper oral hygiene  Community Resource Referral / Chronic Care Management:  CRR required this visit?  No   CCM required this visit?  No      Plan:     I have personally reviewed and noted the following in the patient's chart:   Medical and social history Use of alcohol, tobacco or illicit drugs  Current medications and supplements including opioid prescriptions. Patient is not currently taking opioid prescriptions. Functional ability and status Nutritional status Physical activity Advanced directives List of other physicians Hospitalizations, surgeries, and ER visits in previous 12 months Vitals Screenings to include cognitive, depression, and falls Referrals and appointments  In addition, I have reviewed and discussed with patient certain preventive protocols, quality metrics, and best practice recommendations. A written personalized care plan for preventive services as well as general preventive health recommendations were provided to patient.     BCriselda Peaches LPN   11/0/0712  Nurse Notes: Patient due Diabetic kidney evaluation- Urine ACR and Hemoglobin A1C

## 2022-09-30 NOTE — Patient Instructions (Addendum)
[name] , Thank you for taking time to come for your Medicare Wellness Visit. I appreciate your ongoing commitment to your health goals. Please review the following plan we discussed and let me know if I can assist you in the future.   These are the goals we discussed:  Goals       No current goals (pt-stated)      Patient Stated      Starting 04/27/2018, I will continue to take medications as prescribed.        Patient Stated      Would like to lose weight. Patient would like to lose 25 pounds      sleep management      Starting 04/04/2016, I will attempt to do non-stimulating activities prior to bed to help with better sleep management.         This is a list of the screening recommended for you and due dates:  Health Maintenance  Topic Date Due   Yearly kidney health urinalysis for diabetes  Never done   DTaP/Tdap/Td vaccine (2 - Td or Tdap) 01/26/2022   Hemoglobin A1C  08/03/2022   COVID-19 Vaccine (4 - 2023-24 season) 10/16/2022*   Flu Shot  12/25/2022*   Eye exam for diabetics  12/22/2022   Yearly kidney function blood test for diabetes  02/01/2023   Complete foot exam   02/01/2023   Medicare Annual Wellness Visit  10/01/2023   Pneumonia Vaccine  Completed   Hepatitis C Screening: USPSTF Recommendation to screen - Ages 18-79 yo.  Completed   Zoster (Shingles) Vaccine  Completed   HPV Vaccine  Aged Out   Colon Cancer Screening  Discontinued  *Topic was postponed. The date shown is not the original due date.    Advanced directives: Please bring a copy of your health care power of attorney and living will to the office to be added to your chart at your convenience.   Conditions/risks identified: None  Next appointment: Follow up in one year for your annual wellness visit.    Preventive Care 77 Years and Older, Male  Preventive care refers to lifestyle choices and visits with your health care provider that can promote health and wellness. What does preventive care  include? A yearly physical exam. This is also called an annual well check. Dental exams once or twice a year. Routine eye exams. Ask your health care provider how often you should have your eyes checked. Personal lifestyle choices, including: Daily care of your teeth and gums. Regular physical activity. Eating a healthy diet. Avoiding tobacco and drug use. Limiting alcohol use. Practicing safe sex. Taking low doses of aspirin every day. Taking vitamin and mineral supplements as recommended by your health care provider. What happens during an annual well check? The services and screenings done by your health care provider during your annual well check will depend on your age, overall health, lifestyle risk factors, and family history of disease. Counseling  Your health care provider may ask you questions about your: Alcohol use. Tobacco use. Drug use. Emotional well-being. Home and relationship well-being. Sexual activity. Eating habits. History of falls. Memory and ability to understand (cognition). Work and work Statistician. Screening  You may have the following tests or measurements: Height, weight, and BMI. Blood pressure. Lipid and cholesterol levels. These may be checked every 5 years, or more frequently if you are over 50 years old. Skin check. Lung cancer screening. You may have this screening every year starting at age 63  if you have a 30-pack-year history of smoking and currently smoke or have quit within the past 15 years. Fecal occult blood test (FOBT) of the stool. You may have this test every year starting at age 67. Flexible sigmoidoscopy or colonoscopy. You may have a sigmoidoscopy every 5 years or a colonoscopy every 10 years starting at age 77. Prostate cancer screening. Recommendations will vary depending on your family history and other risks. Hepatitis C blood test. Hepatitis B blood test. Sexually transmitted disease (STD) testing. Diabetes screening. This  is done by checking your blood sugar (glucose) after you have not eaten for a while (fasting). You may have this done every 1-3 years. Abdominal aortic aneurysm (AAA) screening. You may need this if you are a current or former smoker. Osteoporosis. You may be screened starting at age 17 if you are at high risk. Talk with your health care provider about your test results, treatment options, and if necessary, the need for more tests. Vaccines  Your health care provider may recommend certain vaccines, such as: Influenza vaccine. This is recommended every year. Tetanus, diphtheria, and acellular pertussis (Tdap, Td) vaccine. You may need a Td booster every 10 years. Zoster vaccine. You may need this after age 75. Pneumococcal 13-valent conjugate (PCV13) vaccine. One dose is recommended after age 28. Pneumococcal polysaccharide (PPSV23) vaccine. One dose is recommended after age 36. Talk to your health care provider about which screenings and vaccines you need and how often you need them. This information is not intended to replace advice given to you by your health care provider. Make sure you discuss any questions you have with your health care provider. Document Released: 10/09/2015 Document Revised: 06/01/2016 Document Reviewed: 07/14/2015 Elsevier Interactive Patient Education  2017 [place] Prevention in the Home Falls can cause injuries. They can happen to people of all ages. There are many things you can do to make your home safe and to help prevent falls. What can I do on the outside of my home? Regularly fix the edges of walkways and driveways and fix any cracks. Remove anything that might make you trip as you walk through a door, such as a raised step or threshold. Trim any bushes or trees on the path to your home. Use bright outdoor lighting. Clear any walking paths of anything that might make someone trip, such as rocks or tools. Regularly check to see if handrails are  loose or broken. Make sure that both sides of any steps have handrails. Any raised decks and porches should have guardrails on the edges. Have any leaves, snow, or ice cleared regularly. Use sand or salt on walking paths during winter. Clean up any spills in your garage right away. This includes oil or grease spills. What can I do in the bathroom? Use night lights. Install grab bars by the toilet and in the tub and shower. Do not use towel bars as grab bars. Use non-skid mats or decals in the tub or shower. If you need to sit down in the shower, use a plastic, non-slip stool. Keep the floor dry. Clean up any water that spills on the floor as soon as it happens. Remove soap buildup in the tub or shower regularly. Attach bath mats securely with double-sided non-slip rug tape. Do not have throw rugs and other things on the floor that can make you trip. What can I do in the bedroom? Use night lights. Make sure that you have a light by your bed that  is easy to reach. Do not use any sheets or blankets that are too big for your bed. They should not hang down onto the floor. Have a firm chair that has side arms. You can use this for support while you get dressed. Do not have throw rugs and other things on the floor that can make you trip. What can I do in the kitchen? Clean up any spills right away. Avoid walking on wet floors. Keep items that you use a lot in easy-to-reach places. If you need to reach something above you, use a strong step stool that has a grab bar. Keep electrical cords out of the way. Do not use floor polish or wax that makes floors slippery. If you must use wax, use non-skid floor wax. Do not have throw rugs and other things on the floor that can make you trip. What can I do with my stairs? Do not leave any items on the stairs. Make sure that there are handrails on both sides of the stairs and use them. Fix handrails that are broken or loose. Make sure that handrails are as  long as the stairways. Check any carpeting to make sure that it is firmly attached to the stairs. Fix any carpet that is loose or worn. Avoid having throw rugs at the top or bottom of the stairs. If you do have throw rugs, attach them to the floor with carpet tape. Make sure that you have a light switch at the top of the stairs and the bottom of the stairs. If you do not have them, ask someone to add them for you. What else can I do to help prevent falls? Wear shoes that: Do not have high heels. Have rubber bottoms. Are comfortable and fit you well. Are closed at the toe. Do not wear sandals. If you use a stepladder: Make sure that it is fully opened. Do not climb a closed stepladder. Make sure that both sides of the stepladder are locked into place. Ask someone to hold it for you, if possible. Clearly mark and make sure that you can see: Any grab bars or handrails. First and last steps. Where the edge of each step is. Use tools that help you move around (mobility aids) if they are needed. These include: Canes. Walkers. Scooters. Crutches. Turn on the lights when you go into a dark area. Replace any light bulbs as soon as they burn out. Set up your furniture so you have a clear path. Avoid moving your furniture around. If any of your floors are uneven, fix them. If there are any pets around you, be aware of where they are. Review your medicines with your doctor. Some medicines can make you feel dizzy. This can increase your chance of falling. Ask your doctor what other things that you can do to help prevent falls. This information is not intended to replace advice given to you by your health care provider. Make sure you discuss any questions you have with your health care provider. Document Released: 07/09/2009 Document Revised: 02/18/2016 Document Reviewed: 10/17/2014 Elsevier Interactive Patient Education  2017 [employer].

## 2022-10-01 DIAGNOSIS — G4733 Obstructive sleep apnea (adult) (pediatric): Secondary | ICD-10-CM | POA: Diagnosis not present

## 2022-10-07 DIAGNOSIS — G4733 Obstructive sleep apnea (adult) (pediatric): Secondary | ICD-10-CM | POA: Diagnosis not present

## 2022-10-12 ENCOUNTER — Encounter: Payer: Self-pay | Admitting: Oncology

## 2022-10-12 ENCOUNTER — Other Ambulatory Visit: Payer: Self-pay | Admitting: Family Medicine

## 2022-10-12 DIAGNOSIS — H2513 Age-related nuclear cataract, bilateral: Secondary | ICD-10-CM | POA: Diagnosis not present

## 2022-10-12 DIAGNOSIS — H25013 Cortical age-related cataract, bilateral: Secondary | ICD-10-CM | POA: Diagnosis not present

## 2022-10-12 DIAGNOSIS — H25043 Posterior subcapsular polar age-related cataract, bilateral: Secondary | ICD-10-CM | POA: Diagnosis not present

## 2022-10-12 DIAGNOSIS — Z125 Encounter for screening for malignant neoplasm of prostate: Secondary | ICD-10-CM

## 2022-10-12 DIAGNOSIS — H524 Presbyopia: Secondary | ICD-10-CM | POA: Diagnosis not present

## 2022-10-12 DIAGNOSIS — H2512 Age-related nuclear cataract, left eye: Secondary | ICD-10-CM | POA: Diagnosis not present

## 2022-10-12 DIAGNOSIS — E119 Type 2 diabetes mellitus without complications: Secondary | ICD-10-CM

## 2022-10-12 DIAGNOSIS — H35371 Puckering of macula, right eye: Secondary | ICD-10-CM | POA: Diagnosis not present

## 2022-10-12 DIAGNOSIS — D649 Anemia, unspecified: Secondary | ICD-10-CM

## 2022-10-12 LAB — HM DIABETES EYE EXAM

## 2022-10-18 ENCOUNTER — Encounter: Payer: Self-pay | Admitting: Family Medicine

## 2022-10-20 ENCOUNTER — Other Ambulatory Visit (INDEPENDENT_AMBULATORY_CARE_PROVIDER_SITE_OTHER): Payer: PPO

## 2022-10-20 DIAGNOSIS — E119 Type 2 diabetes mellitus without complications: Secondary | ICD-10-CM

## 2022-10-20 DIAGNOSIS — Z125 Encounter for screening for malignant neoplasm of prostate: Secondary | ICD-10-CM | POA: Diagnosis not present

## 2022-10-20 DIAGNOSIS — D649 Anemia, unspecified: Secondary | ICD-10-CM

## 2022-10-20 DIAGNOSIS — G4733 Obstructive sleep apnea (adult) (pediatric): Secondary | ICD-10-CM | POA: Diagnosis not present

## 2022-10-20 LAB — COMPREHENSIVE METABOLIC PANEL
ALT: 23 U/L (ref 0–53)
AST: 18 U/L (ref 0–37)
Albumin: 4 g/dL (ref 3.5–5.2)
Alkaline Phosphatase: 107 U/L (ref 39–117)
BUN: 22 mg/dL (ref 6–23)
CO2: 28 mEq/L (ref 19–32)
Calcium: 9.2 mg/dL (ref 8.4–10.5)
Chloride: 103 mEq/L (ref 96–112)
Creatinine, Ser: 1.36 mg/dL (ref 0.40–1.50)
GFR: 50.68 mL/min — ABNORMAL LOW (ref >60.00)
Glucose, Bld: 270 mg/dL — ABNORMAL HIGH (ref 70–99)
Potassium: 4.5 mEq/L (ref 3.5–5.1)
Sodium: 140 mEq/L (ref 135–145)
Total Bilirubin: 0.4 mg/dL (ref 0.2–1.2)
Total Protein: 6.6 g/dL (ref 6.0–8.3)

## 2022-10-20 LAB — LIPID PANEL
Cholesterol: 147 mg/dL (ref 0–200)
HDL: 54 mg/dL (ref >39.00)
LDL Cholesterol: 72 mg/dL (ref 0–99)
NonHDL: 93.18
Total CHOL/HDL Ratio: 3
Triglycerides: 106 mg/dL (ref 0.0–149.0)
VLDL: 21.2 mg/dL (ref 0.0–40.0)

## 2022-10-20 LAB — CBC WITH DIFFERENTIAL/PLATELET
Basophils Absolute: 0.1 10*3/uL (ref 0.0–0.1)
Basophils Relative: 1 % (ref 0.0–3.0)
Eosinophils Absolute: 0.2 10*3/uL (ref 0.0–0.7)
Eosinophils Relative: 3.2 % (ref 0.0–5.0)
HCT: 36.2 % — ABNORMAL LOW (ref 39.0–52.0)
Hemoglobin: 12.1 g/dL — ABNORMAL LOW (ref 13.0–17.0)
Lymphocytes Relative: 28.3 % (ref 12.0–46.0)
Lymphs Abs: 1.7 10*3/uL (ref 0.7–4.0)
MCHC: 33.5 g/dL (ref 30.0–36.0)
MCV: 87.2 fl (ref 78.0–100.0)
Monocytes Absolute: 0.5 10*3/uL (ref 0.1–1.0)
Monocytes Relative: 8.6 % (ref 3.0–12.0)
Neutro Abs: 3.5 10*3/uL (ref 1.4–7.7)
Neutrophils Relative %: 58.9 % (ref 43.0–77.0)
Platelets: 246 10*3/uL (ref 150.0–400.0)
RBC: 4.15 Mil/uL — ABNORMAL LOW (ref 4.22–5.81)
RDW: 14 % (ref 11.5–15.5)
WBC: 6 10*3/uL (ref 4.0–10.5)

## 2022-10-20 LAB — MICROALBUMIN / CREATININE URINE RATIO
Creatinine,U: 164 mg/dL
Microalb Creat Ratio: 12.8 mg/g (ref 0.0–30.0)
Microalb, Ur: 21 mg/dL — ABNORMAL HIGH (ref 0.0–1.9)

## 2022-10-20 LAB — HEMOGLOBIN A1C: Hgb A1c MFr Bld: 9.1 % — ABNORMAL HIGH (ref 4.6–6.5)

## 2022-10-20 LAB — FERRITIN: Ferritin: 202.6 ng/mL (ref 22.0–322.0)

## 2022-10-20 LAB — TSH: TSH: 3.62 u[IU]/mL (ref 0.35–5.50)

## 2022-10-20 LAB — PSA, MEDICARE: PSA: 2.85 ng/ml (ref 0.10–4.00)

## 2022-10-21 ENCOUNTER — Encounter (INDEPENDENT_AMBULATORY_CARE_PROVIDER_SITE_OTHER): Payer: PPO | Admitting: Ophthalmology

## 2022-10-21 DIAGNOSIS — H35371 Puckering of macula, right eye: Secondary | ICD-10-CM | POA: Diagnosis not present

## 2022-10-21 DIAGNOSIS — H43813 Vitreous degeneration, bilateral: Secondary | ICD-10-CM

## 2022-10-21 DIAGNOSIS — D3131 Benign neoplasm of right choroid: Secondary | ICD-10-CM | POA: Diagnosis not present

## 2022-10-25 DIAGNOSIS — H2512 Age-related nuclear cataract, left eye: Secondary | ICD-10-CM | POA: Diagnosis not present

## 2022-10-25 DIAGNOSIS — H25812 Combined forms of age-related cataract, left eye: Secondary | ICD-10-CM | POA: Diagnosis not present

## 2022-10-27 ENCOUNTER — Ambulatory Visit (INDEPENDENT_AMBULATORY_CARE_PROVIDER_SITE_OTHER): Payer: PPO | Admitting: Family Medicine

## 2022-10-27 ENCOUNTER — Encounter: Payer: Self-pay | Admitting: Family Medicine

## 2022-10-27 VITALS — BP 120/80 | HR 82 | Temp 97.8°F | Ht 66.0 in | Wt 197.0 lb

## 2022-10-27 DIAGNOSIS — Z7189 Other specified counseling: Secondary | ICD-10-CM

## 2022-10-27 DIAGNOSIS — R809 Proteinuria, unspecified: Secondary | ICD-10-CM | POA: Diagnosis not present

## 2022-10-27 DIAGNOSIS — Z0000 Encounter for general adult medical examination without abnormal findings: Secondary | ICD-10-CM

## 2022-10-27 DIAGNOSIS — D649 Anemia, unspecified: Secondary | ICD-10-CM

## 2022-10-27 DIAGNOSIS — E1129 Type 2 diabetes mellitus with other diabetic kidney complication: Secondary | ICD-10-CM

## 2022-10-27 DIAGNOSIS — L918 Other hypertrophic disorders of the skin: Secondary | ICD-10-CM | POA: Diagnosis not present

## 2022-10-27 DIAGNOSIS — E782 Mixed hyperlipidemia: Secondary | ICD-10-CM | POA: Diagnosis not present

## 2022-10-27 MED ORDER — LISINOPRIL 2.5 MG PO TABS: 2.5000 mg | ORAL_TABLET | Freq: Every day | ORAL | 3 refills | Status: DC

## 2022-10-27 MED ORDER — PIOGLITAZONE HCL 30 MG PO TABS: 30.0000 mg | ORAL_TABLET | Freq: Every day | ORAL | 3 refills | Status: DC

## 2022-10-27 NOTE — Progress Notes (Signed)
Diabetes:  Using medications without difficulties: yes Hypoglycemic episodes: no Hyperglycemic episodes:no Feet problems:no Blood Sugars averaging: 164 this AM but that was a low at as it has been in months.  Prev 200s.   eye exam within last year: yes A1c and MALB elevation d/w pt. No h/o angioedema.  ACE cautions d/w pt.    Elevated Cholesterol: Using medications without problems: yes Muscle aches: no Diet compliance: d/w pt.   Exercise: d/w pt.   Anemia.  HGB at baseline, d/w pt.    Flu prev done at pharmacy Shingles 2020 PNA up-to-date covid 2021 Tetanus 2013, d/w pt.   RSV vaccine d/w pt in general.   Colonoscopy 2022 Prostate cancer screening 2024 Advance directive wife designated if patient were incapacitated.  He had L cataract surgery done this week.  He is recovering well.  He has f/u pending.    Irritated skin tag R side of neck.  He consented for liq N2 tx.    PMH and SH reviewed  Meds, vitals, and allergies reviewed.   ROS: Per HPI unless specifically indicated in ROS section   GEN: nad, alert and oriented HEENT: mucous membranes moist NECK: supple w/o LA CV: rrr. PULM: ctab, no inc wob ABD: soft, +bs EXT: no edema SKIN: no acute rash Small irritated skin tag on R side of neck tx x3 with Liq N2 w/o complications.    Diabetic foot exam: Normal inspection No skin breakdown No calluses  Normal DP pulses Normal sensation to light touch and monofilament L 1st  nails thickened slightly, o/w wnl

## 2022-10-27 NOTE — Patient Instructions (Addendum)
Recheck A1c in about 3 months at an office visit.  Increase pioglitazone to '30mg'$  a day in the meantime and keep working on cutting back sugars in your diet.  Take care.  Glad to see you. Use the eat right diet.  Start lisinopril.  The skin tag should blister and then fall off.  Please cover as needed.

## 2022-10-30 DIAGNOSIS — L918 Other hypertrophic disorders of the skin: Secondary | ICD-10-CM | POA: Insufficient documentation

## 2022-10-30 NOTE — Assessment & Plan Note (Signed)
History of, hemoglobin stable.  Has had previous EGD and colonoscopy with previous hematology evaluation.  He has hematology follow-up pending.  Would continue as is for now with observation.

## 2022-10-30 NOTE — Assessment & Plan Note (Signed)
Treated x 3 with liquid nitrogen after getting consent from the patient and this was tolerated well without complication.  Routine instructions given to patient.

## 2022-10-30 NOTE — Assessment & Plan Note (Signed)
Flu prev done at pharmacy Shingles 2020 PNA up-to-date covid 2021 Tetanus 2013, d/w pt.   RSV vaccine d/w pt in general.   Colonoscopy 2022 Prostate cancer screening 2024 Advance directive wife designated if patient were incapacitated.

## 2022-10-30 NOTE — Assessment & Plan Note (Signed)
Continue lovastatin.  Continue work on diet and exercise.

## 2022-10-30 NOTE — Assessment & Plan Note (Signed)
Advance directive- wife designated if patient were incapacitated.  

## 2022-10-30 NOTE — Assessment & Plan Note (Signed)
A1c and MALB elevation d/w pt. No h/o angioedema.  ACE cautions d/w pt.   Recheck A1c in about 3 months at an office visit.  Increase pioglitazone to '30mg'$  a day in the meantime and keep working on cutting back sugars in your diet.  Low sugar diet handout discussed and given to patient.  Continue metformin.  Continue lovastatin.

## 2022-11-01 DIAGNOSIS — H2512 Age-related nuclear cataract, left eye: Secondary | ICD-10-CM | POA: Diagnosis not present

## 2022-11-01 DIAGNOSIS — G4733 Obstructive sleep apnea (adult) (pediatric): Secondary | ICD-10-CM | POA: Diagnosis not present

## 2022-11-03 ENCOUNTER — Ambulatory Visit: Payer: PPO | Admitting: Primary Care

## 2022-11-04 ENCOUNTER — Other Ambulatory Visit: Payer: Self-pay | Admitting: Family Medicine

## 2022-11-20 DIAGNOSIS — G4733 Obstructive sleep apnea (adult) (pediatric): Secondary | ICD-10-CM | POA: Diagnosis not present

## 2022-11-28 DIAGNOSIS — H25011 Cortical age-related cataract, right eye: Secondary | ICD-10-CM | POA: Diagnosis not present

## 2022-11-28 DIAGNOSIS — H2511 Age-related nuclear cataract, right eye: Secondary | ICD-10-CM | POA: Diagnosis not present

## 2022-11-30 DIAGNOSIS — G4733 Obstructive sleep apnea (adult) (pediatric): Secondary | ICD-10-CM | POA: Diagnosis not present

## 2022-12-05 ENCOUNTER — Other Ambulatory Visit: Payer: Self-pay | Admitting: Family Medicine

## 2022-12-16 ENCOUNTER — Ambulatory Visit (INDEPENDENT_AMBULATORY_CARE_PROVIDER_SITE_OTHER): Payer: PPO | Admitting: Primary Care

## 2022-12-16 ENCOUNTER — Encounter: Payer: Self-pay | Admitting: Primary Care

## 2022-12-16 ENCOUNTER — Encounter: Payer: Self-pay | Admitting: Oncology

## 2022-12-16 VITALS — BP 134/66 | HR 80 | Temp 97.9°F | Ht 66.0 in | Wt 200.6 lb

## 2022-12-16 DIAGNOSIS — G4733 Obstructive sleep apnea (adult) (pediatric): Secondary | ICD-10-CM

## 2022-12-16 NOTE — Assessment & Plan Note (Signed)
-   HST in November 2022 showed moderate obstructive sleep apnea, AHI 22.7 an hour.  He is on auto CPAP. He is compliant with use. We have seen improvement in AHI since his last visit but he continues to wake up around 3am and reports restless sleep second half of the night. He is also having some difficulty with mask tolerance/airleaks. Current pressure 5-15cm h20 (15.3cm h20-95%); Residual AHI 5.5/hour. Recommend mask fitting with DME company and adjusting auto pressure settings to 10-18cm h20. Advised he get wedge pillow as well to elevate head since he does sleep on his back. FU in 6 months or sooner if needed.

## 2022-12-16 NOTE — Patient Instructions (Addendum)
Continue to wear CPAP nightly. We have seen improvement in your overall apneas since starting CPAP but you are still having a couple an hour which may be causing you to wake up. We will adjust your pressure settings. You may also want to try a wedge pillow to elevate your head at night if sleeping on your back   Orders: Change CPAP pressure 10-18cm h20  Mask fitting with DME for nasal mask/chin strap  Follow-up: 6 months with Beth NP or sooner if needed

## 2022-12-16 NOTE — Progress Notes (Signed)
@Patient  ID: Theodore Estes, male    DOB: 05/07/1946, 77 y.o.   MRN: [id number]  Chief Complaint  Patient presents with   Follow-up    Wearing cpap avg 4-5hr nightly. Pressure and mask is okay.     Referring provider: Tonia Ghent, MD  HPI:  77 year old male, never smoked.  Past medical history significant for hypertension, diabetes, iron deficiency anemia, sleep apnea.  Previous [facility] pulmonary encounter: 07/12/2021 Patient presents today for sleep consult.  He was referred by family medicine due to symptoms of snoring and fatigue. He does carry diagnosis of anemia and following with heme/onc. He is scheduled for iron transfusion Wednesday 07/14/21. No previous sleep studies. Typical bedtime is 10-11pm. He gets 6-7 hours of sleep a night, he wakes up frequently throughout the night. On average, he gets out of bed 4 times to use the restroom. He will wake up at 5am eat breakfast and go back to bed for another 30-25mins. It's hard for him to stay awake while driving. Denies sleep walking, narcolepsy or cataplexy.   Sleep questionnaire  Symptoms- Snoring, restless sleep, daytime sleepiness Prior sleep study- None  Bedtime-10-11 Time to fall asleep- <10 mins  Nocturnal awakenings- >5-10 times Out of bed in morning- 5am and 6:30am  Weight changes- No Epworth- 17/24  09/29/2021 Patient contacted today for virtual visit to review sleep study results. Patient has symptoms of snoring, restless sleep and daytime sleepiness. HST on 08/24/21 showed moderate OSA, AHI 22/7/hr with SpO2 low 83%. Reviewed sleep study results and discussed untreated sleep apnea increases patient risk for cardiac arrhythmias, pulmonary hypertension, stroke and diabetes. Treatment options include weight loss, oral appliance, CPAP therapy or referral to ENT for possible surgical options.   05/27/2022 Patient presents today for CPAP compliance.  Home sleep study in November 2022 showed moderate obstructive sleep apnea,  AHI 22.7 an hour.  He was started on auto CPAP.  Patient is 100% compliant with use, using on average 5 hours and 32 minutes a night.  Current CPAP pressure 5 to 15 cm H2O (18.7 cm H2O-95th percentile).  He is still having some residual apneas.  His sleep is gradually getting better He is having some trouble with his mask He interested in trying a nasal pillow mask Bedtime is between 10-11pm He wakes up on average once a night around 3am to use the restroom He starts his day around 5am Epworth score 9  [facility] download 04/27/2022 - 05/26/2022 Usage days 30/30 (100%); 27 days (90%) greater than 4 hours Average usage 5 hours 32 minutes Pressure 5 to 15 cm H2O (18.7 cm H2O-95%) Air leaks 10.7 L/min (95%) AHI 9.6   12/16/2022- Interim hx  Patient presents today for OSA follow-up. He continues to have issues with mask fit, he would like to try a new mask. He is currently using full face mask. He wakes up around 3am and has trouble fallings back to sleep. His sleep can be restless the second half of the night. His compliance download does show improvement in AHI.  Current pressure 5-15cm h20, residual AHI 5.5/hour. Average usage 4 hours 58 mins.   Review download 11/15/2022 - 12/14/2022 Usage days 29/30 days (97%); 24 days (80%) greater than 4 hours Average usage 4 hours 58 minutes Pressure 5 to 15 cm H2O (15.3 cm H2O-95%) Air leaks 7.2 L/min (95%) AHI 5.5    No Known Allergies  Immunization History  Administered Date(s) Administered   Fluad Quad(high Dose 65+) 06/06/2019   Influenza  Inj Mdck Quad Pf 08/19/2017   Influenza Whole 06/26/2008, 06/26/2009, 10/19/2011   Influenza, High Dose Seasonal PF 08/31/2022   Influenza, Seasonal, Injecte, Preservative Fre 07/05/2016   Influenza,inj,Quad PF,6+ Mos 07/04/2014, 08/19/2015   Influenza-Unspecified 07/27/2013, 09/03/2018, 06/26/2020   Moderna Sars-Covid-2 Vaccination 11/01/2019, 11/27/2019, 07/27/2020   Pneumococcal Conjugate-13 02/11/2015    Pneumococcal Polysaccharide-23 01/27/2012   Tdap 01/27/2012   Zoster Recombinat (Shingrix) 09/03/2018, 12/17/2018   Zoster, Live 06/26/2008    Past Medical History:  Diagnosis Date   Anemia    Diabetes mellitus without complication ([place])    Dyspnea    Erectile dysfunction    History of colonic polyps    Hyperlipidemia    Hypertension    Hypotestosteronism     Tobacco History: Social History   Tobacco Use  Smoking Status Never  Smokeless Tobacco Never   Counseling given: Not Answered   Outpatient Medications Prior to Visit  Medication Sig Dispense Refill   aspirin 81 MG tablet Take 81 mg by mouth daily.     Blood Glucose Monitoring Suppl (ONE TOUCH ULTRA 2) w/Device KIT Check blood sugar twice a day and as directed. Dx. E11.65 1 each 0   cyanocobalamin 1000 MCG tablet Take 1,000 mcg by mouth daily.     lisinopril (ZESTRIL) 2.5 MG tablet Take 1 tablet (2.5 mg total) by mouth daily. 90 tablet 3   lovastatin (MEVACOR) 20 MG tablet TAKE ONE TABLET BY MOUTH ONCE A DAY 90 tablet 1   metFORMIN (GLUCOPHAGE) 1000 MG tablet TAKE 1 TABLET BY MOUTH TWICE A DAY WITH A MEAL 180 tablet 3   ONETOUCH DELICA LANCETS 99991111 MISC Check blood sugar twice a day and as directed. Dx. E11.65 100 each 5   ONETOUCH ULTRA test strip USE TO CHECK BLOOD SUGARS TWICE DAILY AS DIRECTED. 100 each 11   pioglitazone (ACTOS) 30 MG tablet Take 1 tablet (30 mg total) by mouth daily. 90 tablet 3   No facility-administered medications prior to visit.      Review of Systems  Review of Systems  Constitutional: Negative.   HENT: Negative.    Respiratory: Negative.    Cardiovascular: Negative.      Physical Exam  BP 134/66 (BP Location: Left Arm, Cuff Size: Normal)   Pulse 80   Temp 97.9 F (36.6 C) (Temporal)   Ht 5\' 6"  (1.676 m)   Wt 200 lb 9.6 oz (91 kg)   SpO2 97%   BMI 32.38 kg/m  Physical Exam Constitutional:      Appearance: Normal appearance.  HENT:     Head: Normocephalic and  atraumatic.  Cardiovascular:     Rate and Rhythm: Normal rate and regular rhythm.  Pulmonary:     Effort: Pulmonary effort is normal.     Breath sounds: Normal breath sounds.  Skin:    General: Skin is warm and dry.  Neurological:     General: No focal deficit present.     Mental Status: He is alert and oriented to person, place, and time. Mental status is at baseline.  Psychiatric:        Mood and Affect: Mood normal.        Behavior: Behavior normal.        Thought Content: Thought content normal.        Judgment: Judgment normal.      Lab Results:  CBC    Component Value Date/Time   WBC 6.0 10/20/2022 0744   RBC 4.15 (L) 10/20/2022 0744   HGB  12.1 (L) 10/20/2022 0744   HCT 36.2 (L) 10/20/2022 0744   PLT 246.0 10/20/2022 0744   MCV 87.2 10/20/2022 0744   MCH 29.4 04/11/2022 1103   MCHC 33.5 10/20/2022 0744   RDW 14.0 10/20/2022 0744   LYMPHSABS 1.7 10/20/2022 0744   MONOABS 0.5 10/20/2022 0744   EOSABS 0.2 10/20/2022 0744   BASOSABS 0.1 10/20/2022 0744    BMET    Component Value Date/Time   NA 140 10/20/2022 0744   K 4.5 10/20/2022 0744   CL 103 10/20/2022 0744   CO2 28 10/20/2022 0744   GLUCOSE 270 (H) 10/20/2022 0744   BUN 22 10/20/2022 0744   CREATININE 1.36 10/20/2022 0744   CALCIUM 9.2 10/20/2022 0744   GFRNONAA 71.83 10/19/2009 0859   GFRAA 88 03/25/2008 0955    BNP No results found for: "BNP"  ProBNP    Component Value Date/Time   PROBNP 12.0 01/31/2022 1032    Imaging: No results found.   Assessment & Plan:   No problem-specific Assessment & Plan notes found for this encounter.     Martyn Ehrich, NP 12/16/2022

## 2022-12-16 NOTE — Progress Notes (Signed)
Reviewed and agree with assessment/plan.   Chesley Mires, MD [facility]/Critical [facility] 12/16/2022, 2:06 PM Pager:  [phone]

## 2022-12-19 DIAGNOSIS — G4733 Obstructive sleep apnea (adult) (pediatric): Secondary | ICD-10-CM | POA: Diagnosis not present

## 2022-12-31 DIAGNOSIS — G4733 Obstructive sleep apnea (adult) (pediatric): Secondary | ICD-10-CM | POA: Diagnosis not present

## 2023-01-10 DIAGNOSIS — G4733 Obstructive sleep apnea (adult) (pediatric): Secondary | ICD-10-CM | POA: Diagnosis not present

## 2023-01-17 DIAGNOSIS — H25041 Posterior subcapsular polar age-related cataract, right eye: Secondary | ICD-10-CM | POA: Diagnosis not present

## 2023-01-17 DIAGNOSIS — H25811 Combined forms of age-related cataract, right eye: Secondary | ICD-10-CM | POA: Diagnosis not present

## 2023-01-17 DIAGNOSIS — H25011 Cortical age-related cataract, right eye: Secondary | ICD-10-CM | POA: Diagnosis not present

## 2023-01-17 DIAGNOSIS — H2511 Age-related nuclear cataract, right eye: Secondary | ICD-10-CM | POA: Diagnosis not present

## 2023-01-25 DIAGNOSIS — H2511 Age-related nuclear cataract, right eye: Secondary | ICD-10-CM | POA: Diagnosis not present

## 2023-03-21 ENCOUNTER — Encounter: Payer: Self-pay | Admitting: Family Medicine

## 2023-03-21 ENCOUNTER — Ambulatory Visit (INDEPENDENT_AMBULATORY_CARE_PROVIDER_SITE_OTHER): Payer: PPO | Admitting: Family Medicine

## 2023-03-21 VITALS — BP 122/82 | HR 86 | Temp 98.1°F | Ht 66.0 in | Wt 201.0 lb

## 2023-03-21 DIAGNOSIS — Z7984 Long term (current) use of oral hypoglycemic drugs: Secondary | ICD-10-CM

## 2023-03-21 DIAGNOSIS — E1129 Type 2 diabetes mellitus with other diabetic kidney complication: Secondary | ICD-10-CM

## 2023-03-21 DIAGNOSIS — R809 Proteinuria, unspecified: Secondary | ICD-10-CM | POA: Diagnosis not present

## 2023-03-21 LAB — POCT GLYCOSYLATED HEMOGLOBIN (HGB A1C): Hemoglobin A1C: 7.9 % — AB (ref 4.0–5.6)

## 2023-03-21 MED ORDER — PIOGLITAZONE HCL 45 MG PO TABS: 45.0000 mg | ORAL_TABLET | Freq: Every day | ORAL | 3 refills | Status: DC

## 2023-03-21 NOTE — Patient Instructions (Signed)
Increase pioglitazone to 45mg  a day.  Let me know if not tolerated, if you have swelling on the medicine.  If so, cut back to 30mg  a day.  Recheck in about 3 months.  A1c at the visit.   Take care.  Glad to see you.

## 2023-03-21 NOTE — Progress Notes (Signed)
Diabetes:  Using medications without difficulties: yes Hypoglycemic episodes:no Hyperglycemic episodes:no Feet problems:no Blood Sugars averaging: usually ~160s in the AMs.   eye exam within last year: yes A1c 7.9, d/w pt at [facility].  No BLE edema.    Still with some fatigue.  He has hematology f/u pending.  He is still working long hours and that contributes to difficult with exercise, etc.  D/w pt about diet and exercise.    Meds, vitals, and allergies reviewed.  ROS: Per HPI unless specifically indicated in ROS section   GEN: nad, alert and oriented HEENT: ncat NECK: supple w/o LA CV: rrr. PULM: ctab, no inc wob ABD: soft, +bs EXT: no edema SKIN: well perfused.

## 2023-03-21 NOTE — Assessment & Plan Note (Signed)
Increase pioglitazone to 45mg  a day.  Let me know if not tolerated, if swelling, etc. If so, cut back to 30mg  a day.  Recheck in about 3 months.  A1c at the visit.  See above.

## 2023-04-11 ENCOUNTER — Inpatient Hospital Stay: Payer: PPO | Attending: Oncology

## 2023-04-11 DIAGNOSIS — D508 Other iron deficiency anemias: Secondary | ICD-10-CM

## 2023-04-11 DIAGNOSIS — D631 Anemia in chronic kidney disease: Secondary | ICD-10-CM | POA: Diagnosis not present

## 2023-04-11 DIAGNOSIS — N183 Chronic kidney disease, stage 3 unspecified: Secondary | ICD-10-CM | POA: Diagnosis not present

## 2023-04-11 LAB — FERRITIN: Ferritin: 157 ng/mL (ref 24–336)

## 2023-04-11 LAB — CBC WITH DIFFERENTIAL/PLATELET
Abs Immature Granulocytes: 0.05 10*3/uL (ref 0.00–0.07)
Basophils Absolute: 0 10*3/uL (ref 0.0–0.1)
Basophils Relative: 1 %
Eosinophils Absolute: 0.1 10*3/uL (ref 0.0–0.5)
Eosinophils Relative: 2 %
HCT: 34.2 % — ABNORMAL LOW (ref 39.0–52.0)
Hemoglobin: 11.3 g/dL — ABNORMAL LOW (ref 13.0–17.0)
Immature Granulocytes: 1 %
Lymphocytes Relative: 28 %
Lymphs Abs: 1.7 10*3/uL (ref 0.7–4.0)
MCH: 29.2 pg (ref 26.0–34.0)
MCHC: 33 g/dL (ref 30.0–36.0)
MCV: 88.4 fL (ref 80.0–100.0)
Monocytes Absolute: 0.6 10*3/uL (ref 0.1–1.0)
Monocytes Relative: 10 %
Neutro Abs: 3.7 10*3/uL (ref 1.7–7.7)
Neutrophils Relative %: 58 %
Platelets: 211 10*3/uL (ref 150–400)
RBC: 3.87 MIL/uL — ABNORMAL LOW (ref 4.22–5.81)
RDW: 13.7 % (ref 11.5–15.5)
WBC: 6.1 10*3/uL (ref 4.0–10.5)
nRBC: 0 % (ref 0.0–0.2)

## 2023-04-11 LAB — COMPREHENSIVE METABOLIC PANEL
ALT: 22 U/L (ref 0–44)
AST: 20 U/L (ref 15–41)
Albumin: 3.7 g/dL (ref 3.5–5.0)
Alkaline Phosphatase: 90 U/L (ref 38–126)
Anion gap: 9 (ref 5–15)
BUN: 27 mg/dL — ABNORMAL HIGH (ref 8–23)
CO2: 24 mmol/L (ref 22–32)
Calcium: 8.9 mg/dL (ref 8.9–10.3)
Chloride: 102 mmol/L (ref 98–111)
Creatinine, Ser: 1.3 mg/dL — ABNORMAL HIGH (ref 0.61–1.24)
GFR, Estimated: 57 mL/min — ABNORMAL LOW (ref >60)
Glucose, Bld: 187 mg/dL — ABNORMAL HIGH (ref 70–99)
Potassium: 4.4 mmol/L (ref 3.5–5.1)
Sodium: 135 mmol/L (ref 135–145)
Total Bilirubin: 0.4 mg/dL (ref 0.3–1.2)
Total Protein: 6.8 g/dL (ref 6.5–8.1)

## 2023-04-11 LAB — IRON AND TIBC
Iron: 72 ug/dL (ref 45–182)
Saturation Ratios: 17 % — ABNORMAL LOW (ref 17.9–39.5)
TIBC: 416 ug/dL (ref 250–450)
UIBC: 344 ug/dL

## 2023-04-13 ENCOUNTER — Inpatient Hospital Stay: Payer: PPO

## 2023-04-13 ENCOUNTER — Encounter: Payer: Self-pay | Admitting: Oncology

## 2023-04-13 ENCOUNTER — Inpatient Hospital Stay: Payer: PPO | Admitting: Oncology

## 2023-04-13 VITALS — BP 121/67 | HR 75 | Temp 97.2°F | Resp 18

## 2023-04-13 VITALS — BP 111/73 | HR 90 | Temp 97.2°F | Resp 18 | Wt 205.3 lb

## 2023-04-13 DIAGNOSIS — D631 Anemia in chronic kidney disease: Secondary | ICD-10-CM | POA: Diagnosis not present

## 2023-04-13 DIAGNOSIS — N1831 Chronic kidney disease, stage 3a: Secondary | ICD-10-CM

## 2023-04-13 DIAGNOSIS — N183 Chronic kidney disease, stage 3 unspecified: Secondary | ICD-10-CM | POA: Insufficient documentation

## 2023-04-13 DIAGNOSIS — D508 Other iron deficiency anemias: Secondary | ICD-10-CM

## 2023-04-13 MED ORDER — SODIUM CHLORIDE 0.9 % IV SOLN
200.0000 mg | Freq: Once | INTRAVENOUS | Status: AC
  Administered 2023-04-13: 200 mg via INTRAVENOUS
  Filled 2023-04-13: qty 200

## 2023-04-13 MED ORDER — SODIUM CHLORIDE 0.9 % IV SOLN
Freq: Once | INTRAVENOUS | Status: AC
  Filled 2023-04-13: qty 250

## 2023-04-13 NOTE — Assessment & Plan Note (Addendum)
Labs are reviewed and discussed with patient. Lab Results  Component Value Date   HGB 11.3 (L) 04/11/2023   TIBC 416 04/11/2023   IRONPCTSAT 17 (L) 04/11/2023   FERRITIN 157 04/11/2023   Recommend Venofer weekly x3 to further improve iron store. Goal of ferritin is 200.

## 2023-04-13 NOTE — Assessment & Plan Note (Signed)
Encourage oral hydration and avoid nephrotoxins.   

## 2023-04-13 NOTE — Progress Notes (Signed)
Hematology/Oncology Progress Note [phone] [phone]   Patient Care Team: Joaquim Nam, MD as PCP - General (Family Medicine) Antonieta Iba, MD as Consulting Physician (Cardiology) Isla Pence, OD (Optometry)  CHIEF COMPLAINTS/REASON FOR VISIT:  Follow up with anemia   ASSESSMENT & PLAN:   Anemia in chronic kidney disease (CKD) Labs are reviewed and discussed with patient. Lab Results  Component Value Date   HGB 11.3 (L) 04/11/2023   TIBC 416 04/11/2023   IRONPCTSAT 17 (L) 04/11/2023   FERRITIN 157 04/11/2023   Recommend Venofer weekly x3 to further improve iron store. Goal of ferritin is 200.    CKD (chronic kidney disease) stage 3, GFR 30-59 ml/min (HCC) Encourage oral hydration and avoid nephrotoxins.    Orders Placed This Encounter  Procedures   CBC with Differential (Cancer Center Only)    Standing Status:   Future    Standing Expiration Date:   04/12/2024   Iron and TIBC    Standing Status:   Future    Standing Expiration Date:   04/12/2024   Ferritin    Standing Status:   Future    Standing Expiration Date:   04/12/2024   Retic Panel    Standing Status:   Future    Standing Expiration Date:   04/12/2024   Follow up in 3 months.  All questions were answered. The patient knows to call the clinic with any problems, questions or concerns.  Rickard Patience, MD, PhD [facility] Hematology Oncology 04/13/2023   HISTORY OF PRESENTING ILLNESS:   Theodore Estes is a  77 y.o.  male with PMH listed below was seen in consultation at the request of  Joaquim Nam, MD  for evaluation of anemia  06/03/2021 CBC showed hemoglobin 11.4, mcv 88.1, normal wbc, platelt.  Reviewed previous labs.  Anemia onset was since August 2022.  He denies any black or bloody stool. Last colonoscopy was on 01/09/2014.  Sessile polyp measuring 3 mm in size was found in the sigmoid colon.  Polyp was resected. Pathology showed benign colon mucosa with lymphoid  aggregates.  08/27/2021 colonoscopy showed  5 mm polyp in the descending colon, removed with a cold snare. Resected and retrieved.One 7 mm polyp at the splenic flexure, removed with a cold snare and biopsy forceps.Resected and retrieved.- Diverticulosis in the sigmoid colon and in the descending colon.  08/27/21 Upper endoscopy showed the examined portions of the nasopharynx, oropharynx and larynx were normal.- LA Grade A reflux esophagitis with no bleeding.- Gastroesophageal flap valve classified as Hill Grade IV (no fold, wide open lumen, hiatal hernia present). - 3 cm hiatal hernia.- Erosive gastropathy with scattered flecks of hematin. Biopsied. This could be a potential source of anemia.- Normal examined duodenum.  Patient finished a course of PPI.   INTERVAL HISTORY Theodore Estes is a 77 y.o. male who has above history reviewed by me today presents for follow up visit for management of anemia He feels more fatigued.  Denies hematochezia, hematuria, hematemesis, epistaxis, black tarry stool or easy bruising.  He is on Aspirin 81mg  daily.    Review of Systems  Constitutional:  Negative for appetite change, chills, fatigue, fever and unexpected weight change.  HENT:   Negative for hearing loss and voice change.   Eyes:  Negative for eye problems and icterus.  Respiratory:  Negative for chest tightness, cough and shortness of breath.   Cardiovascular:  Negative for chest pain and leg swelling.  Gastrointestinal:  Negative  for abdominal distention and abdominal pain.  Endocrine: Negative for hot flashes.  Genitourinary:  Negative for difficulty urinating, dysuria and frequency.   Musculoskeletal:  Negative for arthralgias.  Skin:  Negative for itching and rash.  Neurological:  Negative for light-headedness and numbness.  Hematological:  Negative for adenopathy. Does not bruise/bleed easily.  Psychiatric/Behavioral:  Negative for confusion.     MEDICAL HISTORY:  Past Medical History:   Diagnosis Date   Anemia    Diabetes mellitus without complication (HCC)    Dyspnea    Erectile dysfunction    History of colonic polyps    Hyperlipidemia    Hypertension    Hypotestosteronism     SURGICAL HISTORY: Past Surgical History:  Procedure Laterality Date   CATARACT EXTRACTION Left    COLONOSCOPY  08/18/2003   cyst excised from right wrist  09/26/1998   VASECTOMY  09/27/1975    SOCIAL HISTORY: Social History   Socioeconomic History   Marital status: Married    Spouse name: Not on file   Number of children: Not on file   Years of education: Not on file   Highest education level: Not on file  Occupational History   Not on file  Tobacco Use   Smoking status: Never   Smokeless tobacco: Never  Vaping Use   Vaping status: Never Used  Substance and Sexual Activity   Alcohol use: No    Alcohol/week: 0.0 standard drinks of alcohol   Drug use: No   Sexual activity: Yes    Partners: Female  Other Topics Concern   Not on file  Social History Narrative   HS[employer], [employer]   Work: Immunologist   Married '69   2 daughters '76 '77   5 grandchildren   Marriage in good health   Social Determinants of Health   Financial Resource Strain: Low Risk  (09/30/2022)   Overall Financial Resource Strain (CARDIA)    Difficulty of Paying Living Expenses: Not hard at all  Food Insecurity: No Food Insecurity (09/30/2022)   Hunger Vital Sign    Worried About Running Out of Food in the Last Year: Never true    Ran Out of Food in the Last Year: Never true  Transportation Needs: No Transportation Needs (09/30/2022)   PRAPARE - Administrator, Civil Service (Medical): No    Lack of Transportation (Non-Medical): No  Physical Activity: Inactive (09/30/2022)   Exercise Vital Sign    Days of Exercise per Week: 0 days    Minutes of Exercise per Session: 0 min  Stress: No Stress Concern Present (09/30/2022)   [employer] of Occupational Health - Occupational Stress  Questionnaire    Feeling of Stress : Not at all  Social Connections: Socially Integrated (09/30/2022)   Social Connection and Isolation Panel [NHANES]    Frequency of Communication with Friends and Family: More than three times a week    Frequency of Social Gatherings with Friends and Family: More than three times a week    Attends Religious Services: More than 4 times per year    Active Member of [employer] or Organizations: Yes    Attends Banker Meetings: More than 4 times per year    Marital Status: Married  Catering manager Violence: Not At Risk (09/30/2022)   Humiliation, Afraid, Rape, and Kick questionnaire    Fear of Current or Ex-Partner: No    Emotionally Abused: No    Physically Abused: No    Sexually Abused: No  FAMILY HISTORY: Family History  Problem Relation Age of Onset   Cancer Mother        colon   Colon cancer Mother 61   Heart disease Father    Hyperlipidemia Sister    Diabetes Brother    Hypertension Brother    Prostate cancer Neg Hx    Stomach cancer Neg Hx    Esophageal cancer Neg Hx     ALLERGIES:  has No Known Allergies.  MEDICATIONS:  Current Outpatient Medications  Medication Sig Dispense Refill   aspirin 81 MG tablet Take 81 mg by mouth daily.     Blood Glucose Monitoring Suppl (ONE TOUCH ULTRA 2) w/Device KIT Check blood sugar twice a day and as directed. Dx. E11.65 1 each 0   cyanocobalamin 1000 MCG tablet Take 1,000 mcg by mouth daily.     lisinopril (ZESTRIL) 2.5 MG tablet Take 1 tablet (2.5 mg total) by mouth daily. 90 tablet 3   lovastatin (MEVACOR) 20 MG tablet TAKE ONE TABLET BY MOUTH ONCE A DAY 90 tablet 1   metFORMIN (GLUCOPHAGE) 1000 MG tablet TAKE 1 TABLET BY MOUTH TWICE A DAY WITH A MEAL 180 tablet 3   ONETOUCH DELICA LANCETS 33G MISC Check blood sugar twice a day and as directed. Dx. E11.65 100 each 5   ONETOUCH ULTRA test strip USE TO CHECK BLOOD SUGARS TWICE DAILY AS DIRECTED. 100 each 11   pioglitazone (ACTOS) 45 MG  tablet Take 1 tablet (45 mg total) by mouth daily. 90 tablet 3   No current facility-administered medications for this visit.     PHYSICAL EXAMINATION: ECOG PERFORMANCE STATUS: 0 - Asymptomatic There were no vitals filed for this visit.  There were no vitals filed for this visit.   Physical Exam Constitutional:      General: He is not in acute distress.    Appearance: He is obese.  HENT:     Head: Normocephalic and atraumatic.  Eyes:     General: No scleral icterus. Cardiovascular:     Rate and Rhythm: Normal rate and regular rhythm.     Heart sounds: Normal heart sounds.  Pulmonary:     Effort: Pulmonary effort is normal. No respiratory distress.     Breath sounds: No wheezing.  Abdominal:     General: Bowel sounds are normal. There is no distension.     Palpations: Abdomen is soft.  Musculoskeletal:        General: No deformity. Normal range of motion.     Cervical back: Normal range of motion and neck supple.  Skin:    General: Skin is warm and dry.     Findings: No erythema or rash.  Neurological:     Mental Status: He is alert and oriented to person, place, and time. Mental status is at baseline.     Cranial Nerves: No cranial nerve deficit.     Coordination: Coordination normal.  Psychiatric:        Mood and Affect: Mood normal.     LABORATORY DATA:  I have reviewed the data as listed Lab Results  Component Value Date   WBC 6.1 04/11/2023   HGB 11.3 (L) 04/11/2023   HCT 34.2 (L) 04/11/2023   MCV 88.4 04/11/2023   PLT 211 04/11/2023   Recent Labs    10/20/22 0744 04/11/23 1052  NA 140 135  K 4.5 4.4  CL 103 102  CO2 28 24  GLUCOSE 270* 187*  BUN 22 27*  CREATININE 1.36 1.30*  CALCIUM 9.2 8.9  GFRNONAA  --  57*  PROT 6.6 6.8  ALBUMIN 4.0 3.7  AST 18 20  ALT 23 22  ALKPHOS 107 90  BILITOT 0.4 0.4   Iron/TIBC/Ferritin/ %Sat    Component Value Date/Time   IRON 72 04/11/2023 1052   TIBC 416 04/11/2023 1052   FERRITIN 157 04/11/2023 1052    IRONPCTSAT 17 (L) 04/11/2023 1052      RADIOGRAPHIC STUDIES: I have personally reviewed the radiological images as listed and agreed with the findings in the report. No results found.

## 2023-04-20 ENCOUNTER — Inpatient Hospital Stay: Payer: PPO

## 2023-04-20 VITALS — BP 117/68 | HR 69 | Temp 97.8°F | Resp 18

## 2023-04-20 DIAGNOSIS — N183 Chronic kidney disease, stage 3 unspecified: Secondary | ICD-10-CM | POA: Diagnosis not present

## 2023-04-20 DIAGNOSIS — D508 Other iron deficiency anemias: Secondary | ICD-10-CM

## 2023-04-20 MED ORDER — SODIUM CHLORIDE 0.9 % IV SOLN
Freq: Once | INTRAVENOUS | Status: AC
  Filled 2023-04-20: qty 250

## 2023-04-20 MED ORDER — SODIUM CHLORIDE 0.9 % IV SOLN
200.0000 mg | Freq: Once | INTRAVENOUS | Status: AC
  Administered 2023-04-20: 200 mg via INTRAVENOUS
  Filled 2023-04-20: qty 200

## 2023-04-20 NOTE — Progress Notes (Signed)
Declined 30 minute post observation. Aware of risks. Vitals stable at discharge.  

## 2023-04-20 NOTE — Patient Instructions (Signed)
Iron Sucrose Injection What is this medication? IRON SUCROSE (EYE ern SOO krose) treats low levels of iron (iron deficiency anemia) in people with kidney disease. Iron is a mineral that plays an important role in making red blood cells, which carry oxygen from your lungs to the rest of your body. This medicine may be used for other purposes; ask your health care provider or pharmacist if you have questions. COMMON BRAND NAME(S): Venofer What should I tell my care team before I take this medication? They need to know if you have any of these conditions: Anemia not caused by low iron levels Heart disease High levels of iron in the blood Kidney disease Liver disease An unusual or allergic reaction to iron, other medications, foods, dyes, or preservatives Pregnant or trying to get pregnant Breastfeeding How should I use this medication? This medication is for infusion into a vein. It is given in a hospital or clinic setting. Talk to your care team about the use of this medication in children. While this medication may be prescribed for children as young as 2 years for selected conditions, precautions do apply. Overdosage: If you think you have taken too much of this medicine contact a poison control center or emergency room at once. NOTE: This medicine is only for you. Do not share this medicine with others. What if I miss a dose? Keep appointments for follow-up doses. It is important not to miss your dose. Call your care team if you are unable to keep an appointment. What may interact with this medication? Do not take this medication with any of the following: Deferoxamine Dimercaprol Other iron products This medication may also interact with the following: Chloramphenicol Deferasirox This list may not describe all possible interactions. Give your health care provider a list of all the medicines, herbs, non-prescription drugs, or dietary supplements you use. Also tell them if you smoke,  drink alcohol, or use illegal drugs. Some items may interact with your medicine. What should I watch for while using this medication? Visit your care team regularly. Tell your care team if your symptoms do not start to get better or if they get worse. You may need blood work done while you are taking this medication. You may need to follow a special diet. Talk to your care team. Foods that contain iron include: whole grains/cereals, dried fruits, beans, or peas, leafy green vegetables, and organ meats (liver, kidney). What side effects may I notice from receiving this medication? Side effects that you should report to your care team as soon as possible: Allergic reactions--skin rash, itching, hives, swelling of the face, lips, tongue, or throat Low blood pressure--dizziness, feeling faint or lightheaded, blurry vision Shortness of breath Side effects that usually do not require medical attention (report to your care team if they continue or are bothersome): Flushing Headache Joint pain Muscle pain Nausea Pain, redness, or irritation at injection site This list may not describe all possible side effects. Call your doctor for medical advice about side effects. You may report side effects to FDA at 1-800-FDA-1088. Where should I keep my medication? This medication is given in a hospital or clinic. It will not be stored at home. NOTE: This sheet is a summary. It may not cover all possible information. If you have questions about this medicine, talk to your doctor, pharmacist, or health care provider.  2024 Elsevier/Gold Standard (2023-02-17 00:00:00)

## 2023-04-21 ENCOUNTER — Encounter (INDEPENDENT_AMBULATORY_CARE_PROVIDER_SITE_OTHER): Payer: PPO | Admitting: Ophthalmology

## 2023-04-21 DIAGNOSIS — B5801 Toxoplasma chorioretinitis: Secondary | ICD-10-CM

## 2023-04-21 DIAGNOSIS — H43813 Vitreous degeneration, bilateral: Secondary | ICD-10-CM | POA: Diagnosis not present

## 2023-04-21 DIAGNOSIS — E113391 Type 2 diabetes mellitus with moderate nonproliferative diabetic retinopathy without macular edema, right eye: Secondary | ICD-10-CM

## 2023-04-21 DIAGNOSIS — Z7984 Long term (current) use of oral hypoglycemic drugs: Secondary | ICD-10-CM | POA: Diagnosis not present

## 2023-04-21 DIAGNOSIS — H35371 Puckering of macula, right eye: Secondary | ICD-10-CM | POA: Diagnosis not present

## 2023-04-27 ENCOUNTER — Inpatient Hospital Stay: Payer: PPO | Attending: Oncology

## 2023-04-27 VITALS — BP 115/85 | HR 82 | Temp 98.1°F | Resp 16

## 2023-04-27 DIAGNOSIS — D631 Anemia in chronic kidney disease: Secondary | ICD-10-CM | POA: Diagnosis not present

## 2023-04-27 DIAGNOSIS — N183 Chronic kidney disease, stage 3 unspecified: Secondary | ICD-10-CM | POA: Insufficient documentation

## 2023-04-27 DIAGNOSIS — D508 Other iron deficiency anemias: Secondary | ICD-10-CM

## 2023-04-27 MED ORDER — SODIUM CHLORIDE 0.9 % IV SOLN
200.0000 mg | Freq: Once | INTRAVENOUS | Status: AC
  Administered 2023-04-27: 200 mg via INTRAVENOUS
  Filled 2023-04-27: qty 200

## 2023-04-27 MED ORDER — SODIUM CHLORIDE 0.9 % IV SOLN
Freq: Once | INTRAVENOUS | Status: AC
  Filled 2023-04-27: qty 250

## 2023-04-27 NOTE — Progress Notes (Signed)
Patient tolerated Venofer without any complications. Mr. Warta refused to stay for 30 minute observation period. Informed patient to go to ED if he experience any symptoms of an allergic reaction. Patient verbalized understanding

## 2023-05-11 ENCOUNTER — Encounter (INDEPENDENT_AMBULATORY_CARE_PROVIDER_SITE_OTHER): Payer: Self-pay

## 2023-06-22 ENCOUNTER — Ambulatory Visit: Payer: PPO | Admitting: Family Medicine

## 2023-06-22 ENCOUNTER — Encounter: Payer: Self-pay | Admitting: Family Medicine

## 2023-06-22 VITALS — BP 122/76 | HR 83 | Temp 98.0°F | Ht 66.0 in | Wt 197.0 lb

## 2023-06-22 DIAGNOSIS — E119 Type 2 diabetes mellitus without complications: Secondary | ICD-10-CM

## 2023-06-22 DIAGNOSIS — E1129 Type 2 diabetes mellitus with other diabetic kidney complication: Secondary | ICD-10-CM | POA: Diagnosis not present

## 2023-06-22 DIAGNOSIS — R809 Proteinuria, unspecified: Secondary | ICD-10-CM | POA: Diagnosis not present

## 2023-06-22 DIAGNOSIS — Z7984 Long term (current) use of oral hypoglycemic drugs: Secondary | ICD-10-CM

## 2023-06-22 DIAGNOSIS — Z23 Encounter for immunization: Secondary | ICD-10-CM

## 2023-06-22 LAB — POCT GLYCOSYLATED HEMOGLOBIN (HGB A1C): Hemoglobin A1C: 7.5 % — AB (ref 4.0–5.6)

## 2023-06-22 NOTE — Patient Instructions (Signed)
Recheck in late January or in February 2025 for a yearly visit with labs ahead of time.  Take care.  Glad to see you.

## 2023-06-22 NOTE — Progress Notes (Signed)
Diabetes:  Using medications without difficulties: yes Hypoglycemic episodes:no Hyperglycemic episodes: no Feet problems: no Blood Sugars averaging: usually ~ 130-160s eye exam within last year: yes A1c improved to 7.5.  Metformin 1000mg  BID and actos 45mg  daily.   He is working on diet.   Still on CPAP with pulmonary f/u pending.    He has hematology f/u pending.    Meds, vitals, and allergies reviewed.   ROS: Per HPI unless specifically indicated in ROS section   GEN: nad, alert and oriented HEENT: ncat NECK: supple w/o LA CV: rrr. PULM: ctab, no inc wob ABD: soft, +bs EXT: no edema SKIN: Well-perfused.

## 2023-06-25 NOTE — Assessment & Plan Note (Signed)
A1c improved to 7.5.  Metformin 1000mg  BID and actos 45mg  daily.   He is working on diet.  Recheck in late January or in February 2025 for a yearly visit with labs ahead of time.  No change in medications in the meantime.

## 2023-07-03 ENCOUNTER — Encounter: Payer: Self-pay | Admitting: Primary Care

## 2023-07-03 ENCOUNTER — Ambulatory Visit: Payer: PPO | Admitting: Primary Care

## 2023-07-03 VITALS — BP 120/80 | HR 79 | Temp 98.0°F | Ht 66.0 in | Wt 198.6 lb

## 2023-07-03 DIAGNOSIS — G4733 Obstructive sleep apnea (adult) (pediatric): Secondary | ICD-10-CM | POA: Diagnosis not present

## 2023-07-03 NOTE — Assessment & Plan Note (Addendum)
-   Sleep study November 2022 showed moderate obstructive sleep apnea, AHI 22.7 an hour.  Pressure settings were adjusted during last office visit.  He reports some benefit from use. He is getting about 4-6 hours of sleep a night. No issues with mask fit or pressure settings. Current pressure settings 10 - 18 cm H2O with mask of choice; Residual AHI 4.5 an hour.  No changes today. He would like to continue PAP therapy, not interested in discussing alternative treatment options. Continue to advise patient aim to wear CPAP nightly for 4 to 6 hours or longer. Focus on side sleeping position and weight loss. FU in 1 year or sooner if needed.

## 2023-07-03 NOTE — Progress Notes (Signed)
@Patient  ID: Theodore Estes, male    DOB: [birth year], 77 y.o.   MRN: [id number]  Chief Complaint  Patient presents with   Follow-up    Wears CPAP avg 4-6 hours nightly.     Referring provider: Joaquim Nam, MD  HPI: 77 year old male, never smoked.  Past medical history significant for hypertension, diabetes, iron deficiency anemia, sleep apnea.  Previous [facility] pulmonary encounter: 07/12/2021 Patient presents today for sleep consult.  He was referred by family medicine due to symptoms of snoring and fatigue. He does carry diagnosis of anemia and following with heme/onc. He is scheduled for iron transfusion Wednesday 07/14/21. No previous sleep studies. Typical bedtime is 10-11pm. He gets 6-7 hours of sleep a night, he wakes up frequently throughout the night. On average, he gets out of bed 4 times to use the restroom. He will wake up at 5am eat breakfast and go back to bed for another 30-14mins. It's hard for him to stay awake while driving. Denies sleep walking, narcolepsy or cataplexy.   Sleep questionnaire  Symptoms- Snoring, restless sleep, daytime sleepiness Prior sleep study- None  Bedtime-10-11 Time to fall asleep- <10 mins  Nocturnal awakenings- >5-10 times Out of bed in morning- 5am and 6:30am  Weight changes- No Epworth- 17/24  09/29/2021 Patient contacted today for virtual visit to review sleep study results. Patient has symptoms of snoring, restless sleep and daytime sleepiness. HST on 08/24/21 showed moderate OSA, AHI 22/7/hr with SpO2 low 83%. Reviewed sleep study results and discussed untreated sleep apnea increases patient risk for cardiac arrhythmias, pulmonary hypertension, stroke and diabetes. Treatment options include weight loss, oral appliance, CPAP therapy or referral to ENT for possible surgical options.   05/27/2022 Patient presents today for CPAP compliance.  Home sleep study in November 2022 showed moderate obstructive sleep apnea, AHI 22.7 an hour.  He was  started on auto CPAP.  Patient is 100% compliant with use, using on average 5 hours and 32 minutes a night.  Current CPAP pressure 5 to 15 cm H2O (18.7 cm H2O-95th percentile).  He is still having some residual apneas.  His sleep is gradually getting better He is having some trouble with his mask He interested in trying a nasal pillow mask Bedtime is between 10-11pm He wakes up on average once a night around 3am to use the restroom He starts his day around 5am Epworth score 9  Airview download 04/27/2022 - 05/26/2022 Usage days 30/30 (100%); 27 days (90%) greater than 4 hours Average usage 5 hours 32 minutes Pressure 5 to 15 cm H2O (18.7 cm H2O-95%) Air leaks 10.7 L/min (95%) AHI 9.6   12/16/2022 Patient presents today for OSA follow-up. He continues to have issues with mask fit, he would like to try a new mask. He is currently using full face mask. He wakes up around 3am and has trouble fallings back to sleep. His sleep can be restless the second half of the night. His compliance download does show improvement in AHI.  Current pressure 5-15cm h20, residual AHI 5.5/hour. Average usage 4 hours 58 mins.   Review download 11/15/2022 - 12/14/2022 Usage days 29/30 days (97%); 24 days (80%) greater than 4 hours Average usage 4 hours 58 minutes Pressure 5 to 15 cm H2O (15.3 cm H2O-95%) Air leaks 7.2 L/min (95%) AHI 5.5  07/03/2023- Interim hx Patient is here today OSA follow-up. Home sleep study in November 2022 showed moderate obstructive sleep apnea, AHI 22.7 an hour.  CPAP continues to be a  bit of a struggle. He has changed mask a couple of times. He uses full face mask. Generally he feels he is sleeping better, some nights he will sleep through the night. Other nights he will wake up. He no longer snores while wearing mask. He is ok with continuing PAP therapy for now. He was not interested in discussing other treatment options for OSA. Epworth 8   Airview download 05/31/23-06/29/23 Usage days  30/30 days (100%) > 4 hours Average usage 5 hours 43 mins Pressure 10-18cm h20 (13.9cm h20) Airleaks 14.2L/min (95%) AHI 4.5   No Known Allergies  Immunization History  Administered Date(s) Administered   Fluad Quad(high Dose 65+) 06/06/2019   Fluad Trivalent(High Dose 65+) 06/22/2023   Influenza Inj Mdck Quad Pf 08/19/2017   Influenza Whole 06/26/2008, 06/26/2009, 10/19/2011   Influenza, High Dose Seasonal PF 08/31/2022   Influenza, Seasonal, Injecte, Preservative Fre 07/05/2016   Influenza,inj,Quad PF,6+ Mos 07/04/2014, 08/19/2015   Influenza-Unspecified 07/27/2013, 09/03/2018, 06/26/2020   Moderna Sars-Covid-2 Vaccination 11/01/2019, 11/27/2019, 07/27/2020   Pneumococcal Conjugate-13 02/11/2015   Pneumococcal Polysaccharide-23 01/27/2012   Tdap 01/27/2012   Zoster Recombinant(Shingrix) 09/03/2018, 12/17/2018   Zoster, Live 06/26/2008    Past Medical History:  Diagnosis Date   Anemia    Diabetes mellitus without complication (HCC)    Dyspnea    Erectile dysfunction    History of colonic polyps    Hyperlipidemia    Hypertension    Hypotestosteronism     Tobacco History: Social History   Tobacco Use  Smoking Status Never  Smokeless Tobacco Never   Counseling given: Not Answered   Outpatient Medications Prior to Visit  Medication Sig Dispense Refill   aspirin 81 MG tablet Take 81 mg by mouth daily.     Blood Glucose Monitoring Suppl (ONE TOUCH ULTRA 2) w/Device KIT Check blood sugar twice a day and as directed. Dx. E11.65 1 each 0   cyanocobalamin 1000 MCG tablet Take 1,000 mcg by mouth daily.     lisinopril (ZESTRIL) 2.5 MG tablet Take 1 tablet (2.5 mg total) by mouth daily. 90 tablet 3   lovastatin (MEVACOR) 20 MG tablet TAKE ONE TABLET BY MOUTH ONCE A DAY 90 tablet 1   metFORMIN (GLUCOPHAGE) 1000 MG tablet TAKE 1 TABLET BY MOUTH TWICE A DAY WITH A MEAL 180 tablet 3   ONETOUCH DELICA LANCETS 33G MISC Check blood sugar twice a day and as directed. Dx. E11.65  100 each 5   ONETOUCH ULTRA test strip USE TO CHECK BLOOD SUGARS TWICE DAILY AS DIRECTED. 100 each 11   pioglitazone (ACTOS) 45 MG tablet Take 1 tablet (45 mg total) by mouth daily. 90 tablet 3   No facility-administered medications prior to visit.      Review of Systems  Review of Systems  Constitutional: Negative.   HENT: Negative.    Respiratory: Negative.    Psychiatric/Behavioral: Negative.       Physical Exam  BP 120/80 (BP Location: Left Arm, Patient Position: Sitting, Cuff Size: Normal)   Pulse 79   Temp 98 F (36.7 C) (Temporal)   Ht 5\' 6"  (1.676 m)   Wt 198 lb 9.6 oz (90.1 kg)   SpO2 98%   BMI 32.05 kg/m  Physical Exam Constitutional:      Appearance: Normal appearance.  HENT:     Head: Normocephalic and atraumatic.  Cardiovascular:     Rate and Rhythm: Normal rate and regular rhythm.  Pulmonary:     Effort: Pulmonary effort is normal.  Breath sounds: Normal breath sounds.  Musculoskeletal:        General: Normal range of motion.  Skin:    General: Skin is warm and dry.  Neurological:     General: No focal deficit present.     Mental Status: He is alert and oriented to person, place, and time. Mental status is at baseline.  Psychiatric:        Mood and Affect: Mood normal.        Behavior: Behavior normal.        Thought Content: Thought content normal.        Judgment: Judgment normal.      Lab Results:  CBC    Component Value Date/Time   WBC 6.1 04/11/2023 1052   RBC 3.87 (L) 04/11/2023 1052   HGB 11.3 (L) 04/11/2023 1052   HCT 34.2 (L) 04/11/2023 1052   PLT 211 04/11/2023 1052   MCV 88.4 04/11/2023 1052   MCH 29.2 04/11/2023 1052   MCHC 33.0 04/11/2023 1052   RDW 13.7 04/11/2023 1052   LYMPHSABS 1.7 04/11/2023 1052   MONOABS 0.6 04/11/2023 1052   EOSABS 0.1 04/11/2023 1052   BASOSABS 0.0 04/11/2023 1052    BMET    Component Value Date/Time   NA 135 04/11/2023 1052   K 4.4 04/11/2023 1052   CL 102 04/11/2023 1052   CO2 24  04/11/2023 1052   GLUCOSE 187 (H) 04/11/2023 1052   BUN 27 (H) 04/11/2023 1052   CREATININE 1.30 (H) 04/11/2023 1052   CALCIUM 8.9 04/11/2023 1052   GFRNONAA 57 (L) 04/11/2023 1052   GFRAA 88 03/25/2008 0955    BNP No results found for: "BNP"  ProBNP    Component Value Date/Time   PROBNP 12.0 01/31/2022 1032    Imaging: No results found.   Assessment & Plan:   Moderate obstructive sleep apnea - Sleep study November 2022 showed moderate obstructive sleep apnea, AHI 22.7 an hour.  Pressure settings were adjusted during last office visit.  He reports some benefit from use. He is getting about 4-6 hours of sleep a night. No issues with mask fit or pressure settings. Current pressure settings 10 - 18 cm H2O with mask of choice; Residual AHI 4.5 an hour.  No changes today. He would like to continue PAP therapy, not interested in discussing alternative treatment options. Continue to advise patient aim to wear CPAP nightly for 4 to 6 hours or longer. Focus on side sleeping position and weight loss. FU in 1 year or sooner if needed.      Glenford Bayley, NP 07/03/2023

## 2023-07-03 NOTE — Patient Instructions (Signed)
No changed today Sleep apnea is well controlled on current pressure settings Continue to wear CPAP nightly  Follow-up 1 year with [facility] NP or sooner if needed   CPAP and BIPAP Information CPAP and BIPAP are methods that use air pressure to keep your airways open and to help you breathe well. CPAP and BIPAP use different amounts of pressure. Your health care provider will tell you whether CPAP or BIPAP would be more helpful for you. CPAP stands for "continuous positive airway pressure." With CPAP, the amount of pressure stays the same while you breathe in (inhale) and out (exhale). BIPAP stands for "bi-level positive airway pressure." With BIPAP, the amount of pressure will be higher when you inhale and lower when you exhale. This allows you to take larger breaths. CPAP or BIPAP may be used in the hospital, or your health care provider may want you to use it at home. You may need to have a sleep study before your health care provider can order a machine for you to use at home. What are the advantages? CPAP or BIPAP can be helpful if you have: Sleep apnea. Chronic obstructive pulmonary disease (COPD). Heart failure. Medical conditions that cause muscle weakness, including muscular dystrophy or amyotrophic lateral sclerosis (ALS). Other problems that cause breathing to be shallow, weak, abnormal, or difficult. CPAP and BIPAP are most commonly used for obstructive sleep apnea (OSA) to keep the airways from collapsing when the muscles relax during sleep. What are the risks? Generally, this is a safe treatment. However, problems may occur, including: Irritated skin or skin sores if the mask does not fit properly. Dry or stuffy nose or nosebleeds. Dry mouth. Feeling gassy or bloated. Sinus or lung infection if the equipment is not cleaned properly. When should CPAP or BIPAP be used? In most cases, the mask only needs to be worn during sleep. Generally, the mask needs to be worn throughout the  night and during any daytime naps. People with certain medical conditions may also need to wear the mask at other times, such as when they are awake. Follow instructions from your health care provider about when to use the machine. What happens during CPAP or BIPAP?  Both CPAP and BIPAP are provided by a small machine with a flexible plastic tube that attaches to a plastic mask that you wear. Air is blown through the mask into your nose or mouth. The amount of pressure that is used to blow the air can be adjusted on the machine. Your health care provider will set the pressure setting and help you find the best mask for you. Tips for using the mask Because the mask needs to be snug, some people feel trapped or closed-in (claustrophobic) when first using the mask. If you feel this way, you may need to get used to the mask. One way to do this is to hold the mask loosely over your nose or mouth and then gradually apply the mask more snugly. You can also gradually increase the amount of time that you use the mask. Masks are available in various types and sizes. If your mask does not fit well, talk with your health care provider about getting a different one. Some common types of masks include: Full face masks, which fit over the mouth and nose. Nasal masks, which fit over the nose. Nasal pillow or prong masks, which fit into the nostrils. If you are using a mask that fits over your nose and you tend to breathe through your  mouth, a chin strap may be applied to help keep your mouth closed. Use a skin barrier to protect your skin as told by your health care provider. Some CPAP and BIPAP machines have alarms that may sound if the mask comes off or develops a leak. If you have trouble with the mask, it is very important that you talk with your health care provider about finding a way to make the mask easier to tolerate. Do not stop using the mask. There could be a negative impact on your health if you stop using  the mask. Tips for using the machine Place your CPAP or BIPAP machine on a secure table or stand near an electrical outlet. Know where the on/off switch is on the machine. Follow instructions from your health care provider about how to set the pressure on your machine and when you should use it. Do not eat or drink while the CPAP or BIPAP machine is on. Food or fluids could get pushed into your lungs by the pressure of the CPAP or BIPAP. For home use, CPAP and BIPAP machines can be rented or purchased through home health care companies. Many different brands of machines are available. Renting a machine before purchasing may help you find out which particular machine works well for you. Your health insurance company may also decide which machine you may get. Keep the CPAP or BIPAP machine and attachments clean. Ask your health care provider for specific instructions. Check the humidifier if you have a dry stuffy nose or nosebleeds. Make sure it is working correctly. Follow these instructions at home: Take over-the-counter and prescription medicines only as told by your health care provider. Ask if you can take sinus medicine if your sinuses are blocked. Do not use any products that contain nicotine or tobacco. These products include cigarettes, chewing tobacco, and vaping devices, such as e-cigarettes. If you need help quitting, ask your health care provider. Keep all follow-up visits. This is important. Contact a health care provider if: You have redness or pressure sores on your head, face, mouth, or nose from the mask or head gear. You have trouble using the CPAP or BIPAP machine. You cannot tolerate wearing the CPAP or BIPAP mask. Someone tells you that you snore even when wearing your CPAP or BIPAP. Get help right away if: You have trouble breathing. You feel confused. Summary CPAP and BIPAP are methods that use air pressure to keep your airways open and to help you breathe well. If you  have trouble with the mask, it is very important that you talk with your health care provider about finding a way to make the mask easier to tolerate. Do not stop using the mask. There could be a negative impact to your health if you stop using the mask. Follow instructions from your health care provider about when to use the machine. This information is not intended to replace advice given to you by your health care provider. Make sure you discuss any questions you have with your health care provider. Document Revised: 04/21/2021 Document Reviewed: 08/21/2020 Elsevier Patient Education  2023 [employer].

## 2023-07-12 DIAGNOSIS — G4733 Obstructive sleep apnea (adult) (pediatric): Secondary | ICD-10-CM | POA: Diagnosis not present

## 2023-08-14 ENCOUNTER — Inpatient Hospital Stay: Payer: PPO | Attending: Oncology

## 2023-08-14 DIAGNOSIS — D631 Anemia in chronic kidney disease: Secondary | ICD-10-CM | POA: Diagnosis not present

## 2023-08-14 DIAGNOSIS — Z7982 Long term (current) use of aspirin: Secondary | ICD-10-CM | POA: Diagnosis not present

## 2023-08-14 DIAGNOSIS — N183 Chronic kidney disease, stage 3 unspecified: Secondary | ICD-10-CM | POA: Diagnosis not present

## 2023-08-14 DIAGNOSIS — Z800 Family history of malignant neoplasm of digestive organs: Secondary | ICD-10-CM | POA: Insufficient documentation

## 2023-08-14 DIAGNOSIS — N1831 Chronic kidney disease, stage 3a: Secondary | ICD-10-CM

## 2023-08-14 LAB — CBC WITH DIFFERENTIAL (CANCER CENTER ONLY)
Abs Immature Granulocytes: 0.1 10*3/uL — ABNORMAL HIGH (ref 0.00–0.07)
Basophils Absolute: 0.1 10*3/uL (ref 0.0–0.1)
Basophils Relative: 1 %
Eosinophils Absolute: 0.2 10*3/uL (ref 0.0–0.5)
Eosinophils Relative: 3 %
HCT: 37.6 % — ABNORMAL LOW (ref 39.0–52.0)
Hemoglobin: 11.9 g/dL — ABNORMAL LOW (ref 13.0–17.0)
Immature Granulocytes: 2 %
Lymphocytes Relative: 27 %
Lymphs Abs: 1.8 10*3/uL (ref 0.7–4.0)
MCH: 29.3 pg (ref 26.0–34.0)
MCHC: 31.6 g/dL (ref 30.0–36.0)
MCV: 92.6 fL (ref 80.0–100.0)
Monocytes Absolute: 0.7 10*3/uL (ref 0.1–1.0)
Monocytes Relative: 11 %
Neutro Abs: 3.7 10*3/uL (ref 1.7–7.7)
Neutrophils Relative %: 56 %
Platelet Count: 218 10*3/uL (ref 150–400)
RBC: 4.06 MIL/uL — ABNORMAL LOW (ref 4.22–5.81)
RDW: 13.7 % (ref 11.5–15.5)
WBC Count: 6.6 10*3/uL (ref 4.0–10.5)
nRBC: 0 % (ref 0.0–0.2)

## 2023-08-14 LAB — IRON AND TIBC
Iron: 97 ug/dL (ref 45–182)
Saturation Ratios: 23 % (ref 17.9–39.5)
TIBC: 420 ug/dL (ref 250–450)
UIBC: 323 ug/dL

## 2023-08-14 LAB — RETIC PANEL
Immature Retic Fract: 10.7 % (ref 2.3–15.9)
RBC.: 4.03 MIL/uL — ABNORMAL LOW (ref 4.22–5.81)
Retic Count, Absolute: 57.6 10*3/uL (ref 19.0–186.0)
Retic Ct Pct: 1.4 % (ref 0.4–3.1)
Reticulocyte Hemoglobin: 32.5 pg (ref >27.9)

## 2023-08-14 LAB — FERRITIN: Ferritin: 246 ng/mL (ref 24–336)

## 2023-08-15 ENCOUNTER — Ambulatory Visit (INDEPENDENT_AMBULATORY_CARE_PROVIDER_SITE_OTHER): Payer: PPO | Admitting: Family Medicine

## 2023-08-15 ENCOUNTER — Encounter: Payer: Self-pay | Admitting: Family Medicine

## 2023-08-15 VITALS — BP 144/78 | HR 72 | Temp 98.6°F | Ht 66.0 in | Wt 203.0 lb

## 2023-08-15 DIAGNOSIS — R1030 Lower abdominal pain, unspecified: Secondary | ICD-10-CM

## 2023-08-15 DIAGNOSIS — R350 Frequency of micturition: Secondary | ICD-10-CM

## 2023-08-15 LAB — POC URINALSYSI DIPSTICK (AUTOMATED)
Bilirubin, UA: NEGATIVE
Blood, UA: NEGATIVE
Glucose, UA: NEGATIVE
Ketones, UA: NEGATIVE
Leukocytes, UA: NEGATIVE
Nitrite, UA: NEGATIVE
Protein, UA: POSITIVE — AB
Spec Grav, UA: >=1.03 — AB (ref 1.010–1.025)
Urobilinogen, UA: 0.2 U/dL
pH, UA: 6 (ref 5.0–8.0)

## 2023-08-15 NOTE — Progress Notes (Unsigned)
R sided inguinal pain, into the R anterior thigh but not past the R knee. No posterior R thigh pain.  Going on for about 1 week. No fevers.  No burning with urination.  Some possible inc in frequency.  R sided testicle pain.  Not L sided pain.  Some lower back pain.  No testicle mass noted.  Advil clearly helps, only needed once a day.   Meds, vitals, and allergies reviewed.   ROS: Per HPI unless specifically indicated in ROS section   Rrr Ctab Abd soft, not ttp No testicle mass.  Normal ext genitalia.   No RIH noted.  Inguinal area not ttp Upper thigh not ttp No erythema but dry skin noted.  No CVA pain  Unclear if L1 vs L2 dermatomal pain.

## 2023-08-15 NOTE — Patient Instructions (Addendum)
Try taking advil with food 2-3 times per day for the next week or so.  Try the lower back stretches and let me know if that isn't helping.  Take care.  Glad to see you. Update [place] if you have other symptoms or if you don't improve.

## 2023-08-16 DIAGNOSIS — R1030 Lower abdominal pain, unspecified: Secondary | ICD-10-CM | POA: Insufficient documentation

## 2023-08-16 NOTE — Assessment & Plan Note (Signed)
Without acute rash or mass noted. Unclear if L1 vs L2 dermatomal pain.   No ominous findings on exam and a low-dose of daily Advil clearly helps. Discussed observation and stretching.  Low back exercises given to patient.  He can let me know if he does not improve.  Okay for outpatient follow-up.  He agrees to plan.

## 2023-08-17 ENCOUNTER — Inpatient Hospital Stay: Payer: PPO | Admitting: Oncology

## 2023-08-17 ENCOUNTER — Encounter: Payer: Self-pay | Admitting: Oncology

## 2023-08-17 ENCOUNTER — Inpatient Hospital Stay: Payer: PPO

## 2023-08-17 VITALS — BP 135/74 | HR 83 | Temp 97.2°F | Resp 18 | Wt 206.7 lb

## 2023-08-17 DIAGNOSIS — D631 Anemia in chronic kidney disease: Secondary | ICD-10-CM | POA: Diagnosis not present

## 2023-08-17 DIAGNOSIS — N183 Chronic kidney disease, stage 3 unspecified: Secondary | ICD-10-CM | POA: Diagnosis not present

## 2023-08-17 DIAGNOSIS — N1831 Chronic kidney disease, stage 3a: Secondary | ICD-10-CM

## 2023-08-17 NOTE — Assessment & Plan Note (Addendum)
Labs are reviewed and discussed with patient. Lab Results  Component Value Date   HGB 11.9 (L) 08/14/2023   TIBC 420 08/14/2023   IRONPCTSAT 23 08/14/2023   FERRITIN 246 08/14/2023   Goal of ferritin is 200. No need for Venofer

## 2023-08-17 NOTE — Progress Notes (Signed)
Hematology/Oncology Progress Note [phone] [phone]   Patient Care Team: Joaquim Nam, MD as PCP - General (Family Medicine) Mariah Milling Tollie Pizza, MD as Consulting Physician (Cardiology) Isla Pence, OD (Optometry) Rickard Patience, MD as Consulting Physician (Oncology)  CHIEF COMPLAINTS/REASON FOR VISIT:  Follow up with anemia   ASSESSMENT & PLAN:   Anemia in chronic kidney disease (CKD) Labs are reviewed and discussed with patient. Lab Results  Component Value Date   HGB 11.9 (L) 08/14/2023   TIBC 420 08/14/2023   IRONPCTSAT 23 08/14/2023   FERRITIN 246 08/14/2023   Goal of ferritin is 200. No need for Venofer   CKD (chronic kidney disease) stage 3, GFR 30-59 ml/min (HCC) Encourage oral hydration and avoid nephrotoxins.    Orders Placed This Encounter  Procedures   CBC with Differential (Cancer Center Only)    Standing Status:   Future    Standing Expiration Date:   08/16/2024   Iron and TIBC    Standing Status:   Future    Standing Expiration Date:   08/16/2024   Ferritin    Standing Status:   Future    Standing Expiration Date:   08/16/2024   Retic Panel    Standing Status:   Future    Standing Expiration Date:   08/16/2024   Follow up in 6 months.  All questions were answered. The patient knows to call the clinic with any problems, questions or concerns.  Rickard Patience, MD, PhD [facility] Hematology Oncology 08/17/2023   HISTORY OF PRESENTING ILLNESS:   Theodore Estes is a  77 y.o.  male with PMH listed below was seen in consultation at the request of  Joaquim Nam, MD  for evaluation of anemia  06/03/2021 CBC showed hemoglobin 11.4, mcv 88.1, normal wbc, platelt.  Reviewed previous labs.  Anemia onset was since August 2022.  He denies any black or bloody stool. Last colonoscopy was on 01/09/2014.  Sessile polyp measuring 3 mm in size was found in the sigmoid colon.  Polyp was resected. Pathology showed benign colon mucosa with  lymphoid aggregates.  08/27/2021 colonoscopy showed  5 mm polyp in the descending colon, removed with a cold snare. Resected and retrieved.One 7 mm polyp at the splenic flexure, removed with a cold snare and biopsy forceps.Resected and retrieved.- Diverticulosis in the sigmoid colon and in the descending colon.  08/27/21 Upper endoscopy showed the examined portions of the nasopharynx, oropharynx and larynx were normal.- LA Grade A reflux esophagitis with no bleeding.- Gastroesophageal flap valve classified as Hill Grade IV (no fold, wide open lumen, hiatal hernia present). - 3 cm hiatal hernia.- Erosive gastropathy with scattered flecks of hematin. Biopsied. This could be a potential source of anemia.- Normal examined duodenum.  Patient finished a course of PPI.   INTERVAL HISTORY Theodore Estes is a 77 y.o. male who has above history reviewed by me today presents for follow up visit for management of anemia He feels more fatigued.  Denies hematochezia, hematuria, hematemesis, epistaxis, black tarry stool or easy bruising.  He is on Aspirin 81mg  daily.    Review of Systems  Constitutional:  Negative for appetite change, chills, fatigue, fever and unexpected weight change.  HENT:   Negative for hearing loss and voice change.   Eyes:  Negative for eye problems and icterus.  Respiratory:  Negative for chest tightness, cough and shortness of breath.   Cardiovascular:  Negative for chest pain and leg swelling.  Gastrointestinal:  Negative  for abdominal distention and abdominal pain.  Endocrine: Negative for hot flashes.  Genitourinary:  Negative for difficulty urinating, dysuria and frequency.   Musculoskeletal:  Negative for arthralgias.  Skin:  Negative for itching and rash.  Neurological:  Negative for light-headedness and numbness.  Hematological:  Negative for adenopathy. Does not bruise/bleed easily.  Psychiatric/Behavioral:  Negative for confusion.     MEDICAL HISTORY:  Past Medical  History:  Diagnosis Date   Anemia    Diabetes mellitus without complication (HCC)    Dyspnea    Erectile dysfunction    History of colonic polyps    Hyperlipidemia    Hypertension    Hypotestosteronism     SURGICAL HISTORY: Past Surgical History:  Procedure Laterality Date   CATARACT EXTRACTION Left    COLONOSCOPY  08/18/2003   cyst excised from right wrist  09/26/1998   VASECTOMY  09/27/1975    SOCIAL HISTORY: Social History   Socioeconomic History   Marital status: Married    Spouse name: Not on file   Number of children: Not on file   Years of education: Not on file   Highest education level: Not on file  Occupational History   Not on file  Tobacco Use   Smoking status: Never   Smokeless tobacco: Never  Vaping Use   Vaping status: Never Used  Substance and Sexual Activity   Alcohol use: No    Alcohol/week: 0.0 standard drinks of alcohol   Drug use: No   Sexual activity: Yes    Partners: Female  Other Topics Concern   Not on file  Social History Narrative   HS[employer], [employer]   Work: Immunologist   Married '69   2 daughters '76 '77   5 grandchildren   Marriage in good health   Social Determinants of Health   Financial Resource Strain: Low Risk  (09/30/2022)   Overall Financial Resource Strain (CARDIA)    Difficulty of Paying Living Expenses: Not hard at all  Food Insecurity: No Food Insecurity (09/30/2022)   Hunger Vital Sign    Worried About Running Out of Food in the Last Year: Never true    Ran Out of Food in the Last Year: Never true  Transportation Needs: No Transportation Needs (09/30/2022)   PRAPARE - Administrator, Civil Service (Medical): No    Lack of Transportation (Non-Medical): No  Physical Activity: Inactive (09/30/2022)   Exercise Vital Sign    Days of Exercise per Week: 0 days    Minutes of Exercise per Session: 0 min  Stress: No Stress Concern Present (09/30/2022)   [employer] of Occupational Health - Occupational  Stress Questionnaire    Feeling of Stress : Not at all  Social Connections: Socially Integrated (09/30/2022)   Social Connection and Isolation Panel [NHANES]    Frequency of Communication with Friends and Family: More than three times a week    Frequency of Social Gatherings with Friends and Family: More than three times a week    Attends Religious Services: More than 4 times per year    Active Member of [employer] or Organizations: Yes    Attends Banker Meetings: More than 4 times per year    Marital Status: Married  Catering manager Violence: Not At Risk (09/30/2022)   Humiliation, Afraid, Rape, and Kick questionnaire    Fear of Current or Ex-Partner: No    Emotionally Abused: No    Physically Abused: No    Sexually Abused: No  FAMILY HISTORY: Family History  Problem Relation Age of Onset   Cancer Mother        colon   Colon cancer Mother 53   Heart disease Father    Hyperlipidemia Sister    Diabetes Brother    Hypertension Brother    Prostate cancer Neg Hx    Stomach cancer Neg Hx    Esophageal cancer Neg Hx     ALLERGIES:  has No Known Allergies.  MEDICATIONS:  Current Outpatient Medications  Medication Sig Dispense Refill   aspirin 81 MG tablet Take 81 mg by mouth daily.     Blood Glucose Monitoring Suppl (ONE TOUCH ULTRA 2) w/Device KIT Check blood sugar twice a day and as directed. Dx. E11.65 1 each 0   cyanocobalamin 1000 MCG tablet Take 1,000 mcg by mouth daily.     lisinopril (ZESTRIL) 2.5 MG tablet Take 1 tablet (2.5 mg total) by mouth daily. 90 tablet 3   lovastatin (MEVACOR) 20 MG tablet TAKE ONE TABLET BY MOUTH ONCE A DAY 90 tablet 1   metFORMIN (GLUCOPHAGE) 1000 MG tablet TAKE 1 TABLET BY MOUTH TWICE A DAY WITH A MEAL 180 tablet 3   ONETOUCH DELICA LANCETS 33G MISC Check blood sugar twice a day and as directed. Dx. E11.65 100 each 5   ONETOUCH ULTRA test strip USE TO CHECK BLOOD SUGARS TWICE DAILY AS DIRECTED. 100 each 11   pioglitazone (ACTOS) 45  MG tablet Take 1 tablet (45 mg total) by mouth daily. 90 tablet 3   No current facility-administered medications for this visit.     PHYSICAL EXAMINATION: ECOG PERFORMANCE STATUS: 0 - Asymptomatic Vitals:   08/17/23 1439  BP: 135/74  Pulse: 83  Resp: 18  Temp: (!) 97.2 F (36.2 C)    Filed Weights   08/17/23 1439  Weight: 206 lb 11.2 oz (93.8 kg)     Physical Exam Constitutional:      General: He is not in acute distress.    Appearance: He is obese.  HENT:     Head: Normocephalic and atraumatic.  Eyes:     General: No scleral icterus. Cardiovascular:     Rate and Rhythm: Normal rate and regular rhythm.     Heart sounds: Normal heart sounds.  Pulmonary:     Effort: Pulmonary effort is normal. No respiratory distress.     Breath sounds: No wheezing.  Abdominal:     General: Bowel sounds are normal. There is no distension.     Palpations: Abdomen is soft.  Musculoskeletal:        General: No deformity. Normal range of motion.     Cervical back: Normal range of motion and neck supple.  Skin:    General: Skin is warm and dry.     Findings: No erythema or rash.  Neurological:     Mental Status: He is alert and oriented to person, place, and time. Mental status is at baseline.     Cranial Nerves: No cranial nerve deficit.     Coordination: Coordination normal.  Psychiatric:        Mood and Affect: Mood normal.     LABORATORY DATA:  I have reviewed the data as listed Lab Results  Component Value Date   WBC 6.6 08/14/2023   HGB 11.9 (L) 08/14/2023   HCT 37.6 (L) 08/14/2023   MCV 92.6 08/14/2023   PLT 218 08/14/2023   Recent Labs    10/20/22 0744 04/11/23 1052  NA 140 135  K  4.5 4.4  CL 103 102  CO2 28 24  GLUCOSE 270* 187*  BUN 22 27*  CREATININE 1.36 1.30*  CALCIUM 9.2 8.9  GFRNONAA  --  57*  PROT 6.6 6.8  ALBUMIN 4.0 3.7  AST 18 20  ALT 23 22  ALKPHOS 107 90  BILITOT 0.4 0.4   Iron/TIBC/Ferritin/ %Sat    Component Value Date/Time   IRON  97 08/14/2023 0852   TIBC 420 08/14/2023 0852   FERRITIN 246 08/14/2023 0852   IRONPCTSAT 23 08/14/2023 0852      RADIOGRAPHIC STUDIES: I have personally reviewed the radiological images as listed and agreed with the findings in the report. No results found.

## 2023-08-17 NOTE — Assessment & Plan Note (Signed)
Encourage oral hydration and avoid nephrotoxins.   

## 2023-09-16 ENCOUNTER — Other Ambulatory Visit: Payer: Self-pay | Admitting: Family Medicine

## 2023-10-17 DIAGNOSIS — G4733 Obstructive sleep apnea (adult) (pediatric): Secondary | ICD-10-CM | POA: Diagnosis not present

## 2023-10-18 ENCOUNTER — Other Ambulatory Visit: Payer: Self-pay | Admitting: Family Medicine

## 2023-10-27 ENCOUNTER — Encounter (INDEPENDENT_AMBULATORY_CARE_PROVIDER_SITE_OTHER): Payer: PPO | Admitting: Ophthalmology

## 2023-10-27 DIAGNOSIS — H35371 Puckering of macula, right eye: Secondary | ICD-10-CM

## 2023-10-27 DIAGNOSIS — E113391 Type 2 diabetes mellitus with moderate nonproliferative diabetic retinopathy without macular edema, right eye: Secondary | ICD-10-CM

## 2023-10-27 DIAGNOSIS — H43813 Vitreous degeneration, bilateral: Secondary | ICD-10-CM | POA: Diagnosis not present

## 2023-10-27 DIAGNOSIS — Z7984 Long term (current) use of oral hypoglycemic drugs: Secondary | ICD-10-CM | POA: Diagnosis not present

## 2023-10-27 DIAGNOSIS — E113292 Type 2 diabetes mellitus with mild nonproliferative diabetic retinopathy without macular edema, left eye: Secondary | ICD-10-CM | POA: Diagnosis not present

## 2023-11-16 ENCOUNTER — Other Ambulatory Visit: Payer: Self-pay | Admitting: Family Medicine

## 2023-11-29 ENCOUNTER — Other Ambulatory Visit

## 2023-11-29 ENCOUNTER — Encounter: Payer: Self-pay | Admitting: Family Medicine

## 2023-11-29 ENCOUNTER — Ambulatory Visit

## 2023-11-29 ENCOUNTER — Ambulatory Visit: Payer: Self-pay | Admitting: Family Medicine

## 2023-11-29 ENCOUNTER — Ambulatory Visit (INDEPENDENT_AMBULATORY_CARE_PROVIDER_SITE_OTHER): Admitting: Family Medicine

## 2023-11-29 VITALS — Ht 66.0 in

## 2023-11-29 DIAGNOSIS — G8929 Other chronic pain: Secondary | ICD-10-CM

## 2023-11-29 DIAGNOSIS — M545 Low back pain, unspecified: Secondary | ICD-10-CM

## 2023-11-29 DIAGNOSIS — M4316 Spondylolisthesis, lumbar region: Secondary | ICD-10-CM | POA: Diagnosis not present

## 2023-11-29 DIAGNOSIS — I10 Essential (primary) hypertension: Secondary | ICD-10-CM

## 2023-11-29 DIAGNOSIS — R350 Frequency of micturition: Secondary | ICD-10-CM

## 2023-11-29 DIAGNOSIS — R809 Proteinuria, unspecified: Secondary | ICD-10-CM

## 2023-11-29 DIAGNOSIS — M4807 Spinal stenosis, lumbosacral region: Secondary | ICD-10-CM | POA: Diagnosis not present

## 2023-11-29 DIAGNOSIS — Z7984 Long term (current) use of oral hypoglycemic drugs: Secondary | ICD-10-CM

## 2023-11-29 DIAGNOSIS — M47816 Spondylosis without myelopathy or radiculopathy, lumbar region: Secondary | ICD-10-CM | POA: Diagnosis not present

## 2023-11-29 DIAGNOSIS — E1129 Type 2 diabetes mellitus with other diabetic kidney complication: Secondary | ICD-10-CM | POA: Diagnosis not present

## 2023-11-29 DIAGNOSIS — M79651 Pain in right thigh: Secondary | ICD-10-CM

## 2023-11-29 LAB — CBC WITH DIFFERENTIAL/PLATELET
Basophils Absolute: 0.1 10*3/uL (ref 0.0–0.1)
Basophils Relative: 0.7 % (ref 0.0–3.0)
Eosinophils Absolute: 0.1 10*3/uL (ref 0.0–0.7)
Eosinophils Relative: 1.2 % (ref 0.0–5.0)
HCT: 41.3 % (ref 39.0–52.0)
Hemoglobin: 13.7 g/dL (ref 13.0–17.0)
Lymphocytes Relative: 20.9 % (ref 12.0–46.0)
Lymphs Abs: 1.6 10*3/uL (ref 0.7–4.0)
MCHC: 33.2 g/dL (ref 30.0–36.0)
MCV: 90 fl (ref 78.0–100.0)
Monocytes Absolute: 0.9 10*3/uL (ref 0.1–1.0)
Monocytes Relative: 12.1 % — ABNORMAL HIGH (ref 3.0–12.0)
Neutro Abs: 5 10*3/uL (ref 1.4–7.7)
Neutrophils Relative %: 65.1 % (ref 43.0–77.0)
Platelets: 266 10*3/uL (ref 150.0–400.0)
RBC: 4.59 Mil/uL (ref 4.22–5.81)
RDW: 14.2 % (ref 11.5–15.5)
WBC: 7.7 10*3/uL (ref 4.0–10.5)

## 2023-11-29 LAB — COMPREHENSIVE METABOLIC PANEL
ALT: 19 U/L (ref 0–53)
AST: 15 U/L (ref 0–37)
Albumin: 4.6 g/dL (ref 3.5–5.2)
Alkaline Phosphatase: 89 U/L (ref 39–117)
BUN: 32 mg/dL — ABNORMAL HIGH (ref 6–23)
CO2: 28 meq/L (ref 19–32)
Calcium: 10 mg/dL (ref 8.4–10.5)
Chloride: 105 meq/L (ref 96–112)
Creatinine, Ser: 1.5 mg/dL (ref 0.40–1.50)
GFR: 44.71 mL/min — ABNORMAL LOW (ref >60.00)
Glucose, Bld: 169 mg/dL — ABNORMAL HIGH (ref 70–99)
Potassium: 4.9 meq/L (ref 3.5–5.1)
Sodium: 142 meq/L (ref 135–145)
Total Bilirubin: 0.5 mg/dL (ref 0.2–1.2)
Total Protein: 7.3 g/dL (ref 6.0–8.3)

## 2023-11-29 LAB — POC URINALSYSI DIPSTICK (AUTOMATED)
Bilirubin, UA: NEGATIVE
Blood, UA: NEGATIVE
Glucose, UA: NEGATIVE
Ketones, UA: NEGATIVE
Leukocytes, UA: NEGATIVE
Nitrite, UA: NEGATIVE
Protein, UA: POSITIVE — AB
Spec Grav, UA: 1.02 (ref 1.010–1.025)
Urobilinogen, UA: 0.2 U/dL
pH, UA: 5.5 (ref 5.0–8.0)

## 2023-11-29 LAB — PSA: PSA: 4.7 ng/mL — ABNORMAL HIGH (ref 0.10–4.00)

## 2023-11-29 LAB — HEMOGLOBIN A1C: Hgb A1c MFr Bld: 7.9 % — ABNORMAL HIGH (ref 4.6–6.5)

## 2023-11-29 NOTE — Progress Notes (Signed)
 Established Patient Office Visit   Subjective  Patient ID: Theodore Estes, male    DOB: March 31, 1946  Age: 78 y.o. MRN: [id number]  Chief Complaint  Patient presents with   Leg Pain    Right leg pain  stared 4 days ago, thing pain, aches sharp, rate of pain 8 out of 10, patient has been out of work since sat, frequent urination      Pt  is a 78 yo male followed by Crawford Givens, MD and seen for acute concerns.  Pt was having constant R deep mid thigh pain x 4 days.  Pain was worse with sitting or standing.  Symptoms resolved today.  H/o low back pain.  Patient denies injury.  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  Feels like sits pretty comfortably at bench when making repairs.   Pt also with urinary frequency. Going q 30 min and up 4 x per night.  Denies increased thirst.  Drinking less due to symptoms.  Patient endorses some decrease in stream and dribbling over the years.  Unsure of last PSA.  Drinking an eight ounce cup of coffee per day.  Blood sugar this morning 177.  Average around 160.    Patient Active Problem List   Diagnosis Date Noted   Groin pain 08/16/2023   CKD (chronic kidney disease) stage 3, GFR 30-59 ml/min (HCC) 04/13/2023   Skin tag 10/30/2022   Moderate obstructive sleep apnea 05/27/2022   Anemia in chronic kidney disease (CKD) 02/02/2022   Snoring 07/12/2021   Iron deficiency anemia 06/23/2021   Lightheadedness 05/21/2021   Nocturia 05/21/2021   Fatigue 05/21/2021   SOBOE (shortness of breath on exertion) 04/28/2021   Healthcare maintenance 08/26/2020   Creatinine elevation 08/26/2020   Sciatica 02/11/2020   Insomnia 08/26/2019   Tennis elbow 05/23/2019   Shoulder pain 05/23/2019   Advance care planning 02/12/2015   Right knee pain 02/12/2015   Olecranon bursitis 03/19/2014   Medicare annual wellness visit, subsequent 01/29/2012   Hypogonadism in male 08/26/2010   ERECTILE DYSFUNCTION, ORGANIC 10/15/2009   Diabetes mellitus with microalbuminuria  (HCC) 03/26/2008   HLD (hyperlipidemia) 03/26/2008   Essential hypertension 03/26/2008   History of colonic polyps 03/26/2008   Past Medical History:  Diagnosis Date   Anemia    Diabetes mellitus without complication (HCC)    Dyspnea    Erectile dysfunction    History of colonic polyps    Hyperlipidemia    Hypertension    Hypotestosteronism    Past Surgical History:  Procedure Laterality Date   CATARACT EXTRACTION Left    COLONOSCOPY  08/18/2003   cyst excised from right wrist  09/26/1998   VASECTOMY  09/27/1975   Social History   Tobacco Use   Smoking status: Never   Smokeless tobacco: Never  Vaping Use   Vaping status: Never Used  Substance Use Topics   Alcohol use: No    Alcohol/week: 0.0 standard drinks of alcohol   Drug use: No   Family History  Problem Relation Age of Onset   Cancer Mother        colon   Colon cancer Mother 43   Heart disease Father    Hyperlipidemia Sister    Diabetes Brother    Hypertension Brother    Prostate cancer Neg Hx    Stomach cancer Neg Hx    Esophageal cancer Neg Hx     ROS Negative unless stated above    Objective:     Ht  5\' 6"  (1.676 m)   BMI 33.36 kg/m  BP Readings from Last 3 Encounters:  08/17/23 135/74  08/15/23 (!) 144/78  07/03/23 120/80   Wt Readings from Last 3 Encounters:  08/17/23 206 lb 11.2 oz (93.8 kg)  08/15/23 203 lb (92.1 kg)  07/03/23 198 lb 9.6 oz (90.1 kg)      Physical Exam Constitutional:      General: He is not in acute distress.    Appearance: Normal appearance.  HENT:     Head: Normocephalic and atraumatic.     Nose: Nose normal.     Mouth/Throat:     Mouth: Mucous membranes are moist.  Cardiovascular:     Rate and Rhythm: Normal rate and regular rhythm.     Heart sounds: Normal heart sounds. No murmur heard.    No gallop.  Pulmonary:     Effort: Pulmonary effort is normal. No respiratory distress.     Breath sounds: Normal breath sounds. No wheezing, rhonchi or rales.   Musculoskeletal:     Cervical back: Normal.     Thoracic back: Normal.     Lumbar back: Normal.     Right hip: Normal. No tenderness or bony tenderness. Normal strength.     Left hip: Normal. No tenderness or bony tenderness. Normal strength.     Right upper leg: Normal. No swelling, edema, tenderness or bony tenderness.     Left upper leg: Normal. No swelling, edema, tenderness or bony tenderness.  Skin:    General: Skin is warm and dry.  Neurological:     Mental Status: He is alert and oriented to person, place, and time.      Results for orders placed or performed in visit on 11/29/23  POCT Urinalysis Dipstick (Automated)  Result Value Ref Range   Color, UA yellow    Clarity, UA clear    Glucose, UA Negative Negative   Bilirubin, UA neg    Ketones, UA neg    Spec Grav, UA 1.020 1.010 - 1.025   Blood, UA neg    pH, UA 5.5 5.0 - 8.0   Protein, UA Positive (A) Negative   Urobilinogen, UA 0.2 0.2 or 1.0 E.U./dL   Nitrite, UA neg    Leukocytes, UA Negative Negative      Assessment & Plan:  Urinary frequency -     POCT Urinalysis Dipstick (Automated) -     PSA -     CBC with Differential/Platelet -     Comprehensive metabolic panel -     Urinalysis, Routine w reflex microscopic -     Hemoglobin A1c; Future  Right thigh pain -     DG Lumbar Spine 2-3 Views; Future -     CBC with Differential/Platelet  Chronic midline low back pain without sciatica -     DG Lumbar Spine 2-3 Views; Future -     CBC with Differential/Platelet  Diabetes mellitus with microalbuminuria (HCC) -     Hemoglobin A1c; Future  Essential hypertension  Patient seen for acute concern.  Increased urinary frequency x 4 days.  Discussed possible causes including UTI, hyperglycemia, BPH.  POC UA negative.  Will obtain micro.  Suspect DM may be contributing as blood sugar 177 this morning.  Hemoglobin A1c was 7.5% on 06/22/2023.  Will see if A1C can be added.  Discussed the importance of lifestyle  modifications.    Patient with acute mid right thigh pain now resolved.  Given history of low back pain consider  radiculopathy.  Also consider PVD or DVT.  Will obtain lumbar x-ray.  Prior x-ray of lumbar spine 01/31/2022 with mild degenerative changes and multilevel facet arthropathy.  Consider further workup including MRI or ultrasound if needed based on results/continued symptoms.  Discussed supportive care and ergonomic workspace modifications.  Consider PT.    BP elevated this visit.  Previously controlled.  Recheck.  Continue current meds.  Follow-up with PCP.  Given strict precautions.   On day of service, 41 minutes spent caring for this patient face-to-face, reviewing the chart, counseling and/or coordinating care for plan and treatment of diagnosis below.     Return if symptoms worsen or fail to improve.   Deeann Saint, MD

## 2023-11-29 NOTE — Telephone Encounter (Signed)
  Communication  Red Word that prompted transfer to Nurse Triage: Patient has been having painful right leg pain 8/10 for 4 days and frequent urination                Chief Complaint: Right leg pain, from hip to knee.8/10. Symptoms: Pain, mild swelling Frequency: 4 days Pertinent Negatives: Patient denies  Disposition: [] ED /[] Urgent Care (no appt availability in office) / [x] Appointment(In office/virtual)/ []  [facility]/ [] Home Care/ [] Refused Recommended Disposition /[] [facility]/ []  Follow-up with PCP Additional Notes: Agrees with appointment.  Reason for Disposition  [1] SEVERE pain (e.g., excruciating, unable to do any normal activities) AND [2] not improved after 2 hours of pain medicine  Answer Assessment - Initial Assessment Questions 1. ONSET: "When did the pain start?"      4 days 2. LOCATION: "Where is the pain located?"      Right leg , hip to knee 3. PAIN: "How bad is the pain?"    (Scale 1-10; or mild, moderate, severe)   -  MILD (1-3): doesn't interfere with normal activities    -  MODERATE (4-7): interferes with normal activities (e.g., work or school) or awakens from sleep, limping    -  SEVERE (8-10): excruciating pain, unable to do any normal activities, unable to walk     8 4. WORK OR EXERCISE: "Has there been any recent work or exercise that involved this part of the body?"      No 5. CAUSE: "What do you think is causing the leg pain?"     Unsure 6. OTHER SYMPTOMS: "Do you have any other symptoms?" (e.g., chest pain, back pain, breathing difficulty, swelling, rash, fever, numbness, weakness)     Swelling  7. PREGNANCY: "Is there any chance you are pregnant?" "When was your last menstrual period?"     N/a  Protocols used: Leg Pain-A-AH

## 2023-11-30 LAB — URINALYSIS, ROUTINE W REFLEX MICROSCOPIC
Bilirubin, UA: NEGATIVE
Ketones, UA: NEGATIVE
Leukocytes,UA: NEGATIVE
Nitrite, UA: NEGATIVE
RBC, UA: NEGATIVE
Specific Gravity, UA: 1.019 (ref 1.005–1.030)
Urobilinogen, Ur: 0.2 mg/dL (ref 0.2–1.0)
pH, UA: 5 (ref 5.0–7.5)

## 2023-12-01 ENCOUNTER — Other Ambulatory Visit: Payer: Self-pay | Admitting: Family Medicine

## 2023-12-01 ENCOUNTER — Encounter: Payer: Self-pay | Admitting: Family Medicine

## 2023-12-01 DIAGNOSIS — R972 Elevated prostate specific antigen [PSA]: Secondary | ICD-10-CM

## 2023-12-01 DIAGNOSIS — R350 Frequency of micturition: Secondary | ICD-10-CM

## 2023-12-03 ENCOUNTER — Telehealth: Payer: Self-pay | Admitting: Family Medicine

## 2023-12-03 NOTE — Telephone Encounter (Signed)
 Please update patient- they will get a letter/mychart message about this.    The hospital system discovered a software error in a system at the laboratory at [facility] at [address] in [place] that examines kidney function. The patient's results were incorrectly calculated on this test.  The test is called uACR. It measures the amount of albumin (a protein) in the urine relative to creatinine (a waste product). This test is one of several factors used to judge kidney function. The laboratory software miscalculated the ratio of one to the other. The measurements themselves were correct. The software error has been fixed.  I went back and checked the patient's lab results and medicines.    We can talk about it at his visit on 12/04/23.    If the patient has any questions or concerns about this, please schedule a visit/let me know.   Thanks.

## 2023-12-04 ENCOUNTER — Encounter: Payer: Self-pay | Admitting: Family Medicine

## 2023-12-04 ENCOUNTER — Ambulatory Visit (INDEPENDENT_AMBULATORY_CARE_PROVIDER_SITE_OTHER): Admitting: Family Medicine

## 2023-12-04 VITALS — BP 128/72 | HR 102 | Temp 97.7°F | Ht 66.0 in | Wt 197.2 lb

## 2023-12-04 DIAGNOSIS — Z7984 Long term (current) use of oral hypoglycemic drugs: Secondary | ICD-10-CM | POA: Diagnosis not present

## 2023-12-04 DIAGNOSIS — R350 Frequency of micturition: Secondary | ICD-10-CM | POA: Diagnosis not present

## 2023-12-04 DIAGNOSIS — M79606 Pain in leg, unspecified: Secondary | ICD-10-CM

## 2023-12-04 DIAGNOSIS — R809 Proteinuria, unspecified: Secondary | ICD-10-CM | POA: Diagnosis not present

## 2023-12-04 DIAGNOSIS — E1129 Type 2 diabetes mellitus with other diabetic kidney complication: Secondary | ICD-10-CM

## 2023-12-04 MED ORDER — GABAPENTIN 100 MG PO CAPS: 100.0000 mg | ORAL_CAPSULE | Freq: Three times a day (TID) | ORAL | 1 refills | Status: DC

## 2023-12-04 MED ORDER — SULFAMETHOXAZOLE-TRIMETHOPRIM 800-160 MG PO TABS: 1.0000 | ORAL_TABLET | Freq: Two times a day (BID) | ORAL | 0 refills | Status: DC

## 2023-12-04 NOTE — Telephone Encounter (Signed)
 D/w pt at OV

## 2023-12-04 NOTE — Patient Instructions (Addendum)
 Start taking gabapentin for leg pain and let me know if that helps.   Start with 1 tab 3 times a day.  Can take up to 2 tabs 3 times a day.  Sedation caution.   We'll update you about your xray.   Go to the lab on the way out.   If you have mychart we'll likely use that to update you.    Start septra in the meantime.  While taking septra, cut metformin back to 1 tab a day.   Recheck at a yearly visit in the summer of 2025, labs ahead of time.  Take care.  Glad to see you.

## 2023-12-04 NOTE — Progress Notes (Unsigned)
 R leg pain.  Pain down the R thigh, anterior.  No rash.  No redness or bruising.  No L leg sx.  More pain weight bearing.  Going on 1 week.  Pain all the time but sometimes worse than others.    Prev Xray done.  D/w pt.    He had prev urinary frequency.  That resolved in the meantime.  No burning with urination. PSA elevated, d/w pt.   D/w pt about prev MALB results.  Already on ACE and MALB ratio <200.  It does not appear that he has to change his medications at this point.  We are going to recheck his labs this summer per routine.  Meds, vitals, and allergies reviewed.   ROS: Per HPI unless specifically indicated in ROS section   Nad Ncat Neck supple, no LA Rrr Ctab Abd soft not ttp Calf circ 33cm B, calf not ttp R thigh not ttp.   R SLR neg.  S/S intact BLE.  Prostate wnl. Not enlarged or ttp.   See after visit summary.

## 2023-12-05 LAB — URINE CULTURE
MICRO NUMBER:: 16180639
Result:: NO GROWTH
SPECIMEN QUALITY:: ADEQUATE

## 2023-12-06 DIAGNOSIS — R350 Frequency of micturition: Secondary | ICD-10-CM | POA: Insufficient documentation

## 2023-12-06 DIAGNOSIS — M79606 Pain in leg, unspecified: Secondary | ICD-10-CM | POA: Insufficient documentation

## 2023-12-06 NOTE — Assessment & Plan Note (Signed)
 Leg pain could radiate from the back.  Discussed.  X-rays reviewed.  Will ask for expedited read on the plain films. Start taking gabapentin for leg pain and he can let me know if that helps.   Start with 1 tab 3 times a day.  Can take up to 2 tabs 3 times a day.  Sedation caution.  He agrees to plan.

## 2023-12-06 NOTE — Assessment & Plan Note (Signed)
 D/w pt about prev MALB results.  Already on ACE and MALB ratio <200.  It does not appear that he has to change his medications at this point.  We are going to recheck his labs this summer per routine.

## 2023-12-06 NOTE — Assessment & Plan Note (Signed)
 Start septra in the meantime.  While taking septra, cut metformin back to 1 tab a day.  See notes on ucx.   He does not have prostatitis based on his exam but given his urinary symptoms and elevated PSA I think it makes sense to treat him in the meantime.

## 2023-12-11 ENCOUNTER — Telehealth: Payer: Self-pay

## 2023-12-11 ENCOUNTER — Encounter: Payer: Self-pay | Admitting: Family Medicine

## 2023-12-11 NOTE — Telephone Encounter (Signed)
 Copied from CRM [id number]. Topic: Clinical - Lab/Test Results >> Dec 11, 2023  9:46 AM [place] J wrote: Reason for CRM: Patient returning missed call regarding imaging results. Let patient know to look out for a call back.

## 2023-12-12 NOTE — Telephone Encounter (Signed)
See lab results for documentation.  

## 2023-12-13 ENCOUNTER — Other Ambulatory Visit: Payer: Self-pay | Admitting: Family Medicine

## 2023-12-13 MED ORDER — SULFAMETHOXAZOLE-TRIMETHOPRIM 800-160 MG PO TABS: 1.0000 | ORAL_TABLET | Freq: Two times a day (BID) | ORAL | 0 refills | Status: DC

## 2023-12-13 MED ORDER — GABAPENTIN 100 MG PO CAPS: 100.0000 mg | ORAL_CAPSULE | Freq: Three times a day (TID) | ORAL | 1 refills | Status: DC

## 2023-12-13 NOTE — Telephone Encounter (Signed)
 Closing encounter. Notes documented in another encounter.

## 2023-12-16 ENCOUNTER — Other Ambulatory Visit: Payer: Self-pay | Admitting: Family Medicine

## 2023-12-16 DIAGNOSIS — G4733 Obstructive sleep apnea (adult) (pediatric): Secondary | ICD-10-CM | POA: Diagnosis not present

## 2024-02-12 ENCOUNTER — Inpatient Hospital Stay: Payer: PPO | Attending: Oncology

## 2024-02-12 DIAGNOSIS — N183 Chronic kidney disease, stage 3 unspecified: Secondary | ICD-10-CM | POA: Insufficient documentation

## 2024-02-12 DIAGNOSIS — D631 Anemia in chronic kidney disease: Secondary | ICD-10-CM | POA: Diagnosis not present

## 2024-02-12 DIAGNOSIS — N1831 Chronic kidney disease, stage 3a: Secondary | ICD-10-CM

## 2024-02-12 LAB — CBC WITH DIFFERENTIAL (CANCER CENTER ONLY)
Abs Immature Granulocytes: 0.07 10*3/uL (ref 0.00–0.07)
Basophils Absolute: 0.1 10*3/uL (ref 0.0–0.1)
Basophils Relative: 2 %
Eosinophils Absolute: 0.1 10*3/uL (ref 0.0–0.5)
Eosinophils Relative: 3 %
HCT: 33.8 % — ABNORMAL LOW (ref 39.0–52.0)
Hemoglobin: 10.9 g/dL — ABNORMAL LOW (ref 13.0–17.0)
Immature Granulocytes: 1 %
Lymphocytes Relative: 31 %
Lymphs Abs: 1.7 10*3/uL (ref 0.7–4.0)
MCH: 29.5 pg (ref 26.0–34.0)
MCHC: 32.2 g/dL (ref 30.0–36.0)
MCV: 91.6 fL (ref 80.0–100.0)
Monocytes Absolute: 0.5 10*3/uL (ref 0.1–1.0)
Monocytes Relative: 10 %
Neutro Abs: 2.9 10*3/uL (ref 1.7–7.7)
Neutrophils Relative %: 53 %
Platelet Count: 228 10*3/uL (ref 150–400)
RBC: 3.69 MIL/uL — ABNORMAL LOW (ref 4.22–5.81)
RDW: 14.8 % (ref 11.5–15.5)
WBC Count: 5.4 10*3/uL (ref 4.0–10.5)
nRBC: 0 % (ref 0.0–0.2)

## 2024-02-12 LAB — FERRITIN: Ferritin: 190 ng/mL (ref 24–336)

## 2024-02-12 LAB — RETIC PANEL
Immature Retic Fract: 11 % (ref 2.3–15.9)
RBC.: 3.68 MIL/uL — ABNORMAL LOW (ref 4.22–5.81)
Retic Count, Absolute: 60 10*3/uL (ref 19.0–186.0)
Retic Ct Pct: 1.6 % (ref 0.4–3.1)
Reticulocyte Hemoglobin: 32 pg (ref >27.9)

## 2024-02-12 LAB — IRON AND TIBC
Iron: 79 ug/dL (ref 45–182)
Saturation Ratios: 19 % (ref 17.9–39.5)
TIBC: 416 ug/dL (ref 250–450)
UIBC: 337 ug/dL

## 2024-02-15 ENCOUNTER — Inpatient Hospital Stay: Payer: PPO | Admitting: Oncology

## 2024-02-15 ENCOUNTER — Inpatient Hospital Stay: Payer: PPO

## 2024-02-15 ENCOUNTER — Encounter: Payer: Self-pay | Admitting: Oncology

## 2024-02-15 VITALS — BP 144/64 | HR 76 | Temp 96.2°F | Resp 18 | Ht 66.0 in | Wt 205.1 lb

## 2024-02-15 DIAGNOSIS — N1831 Chronic kidney disease, stage 3a: Secondary | ICD-10-CM

## 2024-02-15 DIAGNOSIS — N183 Chronic kidney disease, stage 3 unspecified: Secondary | ICD-10-CM | POA: Diagnosis not present

## 2024-02-15 DIAGNOSIS — D631 Anemia in chronic kidney disease: Secondary | ICD-10-CM | POA: Diagnosis not present

## 2024-02-15 DIAGNOSIS — D508 Other iron deficiency anemias: Secondary | ICD-10-CM

## 2024-02-15 MED ORDER — IRON SUCROSE 20 MG/ML IV SOLN
200.0000 mg | Freq: Once | INTRAVENOUS | Status: AC
  Administered 2024-02-15: 200 mg via INTRAVENOUS
  Filled 2024-02-15: qty 10

## 2024-02-15 NOTE — Progress Notes (Signed)
 Fatigue/weakness: BETTER Dyspena: NO  Light headedness: NO  Blood in stool: NO  Back xray 3/25.

## 2024-02-15 NOTE — Assessment & Plan Note (Signed)
 Labs are reviewed and discussed with patient. Lab Results  Component Value Date   HGB 10.9 (L) 02/12/2024   TIBC 416 02/12/2024   IRONPCTSAT 19 02/12/2024   FERRITIN 190 02/12/2024   Goal of ferritin is 200. Venofer  200mg  x 1

## 2024-02-15 NOTE — Progress Notes (Signed)
 Hematology/Oncology Progress Note [phone] [phone]   Patient Care Team: Donnie Galea, MD as PCP - General (Family Medicine) Jerelene Monday Deadra Everts, MD as Consulting Physician (Cardiology) Julia Oats, OD (Optometry) Timmy Forbes, MD as Consulting Physician (Oncology)  CHIEF COMPLAINTS/REASON FOR VISIT:  Follow up with anemia   ASSESSMENT & PLAN:   Anemia in chronic kidney disease (CKD) Labs are reviewed and discussed with patient. Lab Results  Component Value Date   HGB 10.9 (L) 02/12/2024   TIBC 416 02/12/2024   IRONPCTSAT 19 02/12/2024   FERRITIN 190 02/12/2024   Goal of ferritin is 200. Venofer  200mg  x 1   CKD (chronic kidney disease) stage 3, GFR 30-59 ml/min (HCC) Encourage oral hydration and avoid nephrotoxins.    Orders Placed This Encounter  Procedures   CBC with Differential (Cancer Center Only)    Standing Status:   Future    Expected Date:   08/17/2024    Expiration Date:   02/14/2025   Iron  and TIBC    Standing Status:   Future    Expected Date:   08/17/2024    Expiration Date:   02/14/2025   Ferritin    Standing Status:   Future    Expected Date:   08/17/2024    Expiration Date:   02/14/2025   Retic Panel    Standing Status:   Future    Expected Date:   08/17/2024    Expiration Date:   02/14/2025   Follow up in 6 months.  All questions were answered. The patient knows to call the clinic with any problems, questions or concerns.  Timmy Forbes, MD, PhD [facility] Hematology Oncology 02/15/2024   HISTORY OF PRESENTING ILLNESS:   Theodore Estes is a  78 y.o.  male with PMH listed below was seen in consultation at the request of  Donnie Galea, MD  for evaluation of anemia  06/03/2021 CBC showed hemoglobin 11.4, mcv 88.1, normal wbc, platelt.  Reviewed previous labs.  Anemia onset was since August 2022.  He denies any black or bloody stool. Last colonoscopy was on 01/09/2014.  Sessile polyp measuring 3 mm in size was found  in the sigmoid colon.  Polyp was resected. Pathology showed benign colon mucosa with lymphoid aggregates.  08/27/2021 colonoscopy showed  5 mm polyp in the descending colon, removed with a cold snare. Resected and retrieved.One 7 mm polyp at the splenic flexure, removed with a cold snare and biopsy forceps.Resected and retrieved.- Diverticulosis in the sigmoid colon and in the descending colon.  08/27/21 Upper endoscopy showed the examined portions of the nasopharynx, oropharynx and larynx were normal.- LA Grade A reflux esophagitis with no bleeding.- Gastroesophageal flap valve classified as Hill Grade IV (no fold, wide open lumen, hiatal hernia present). - 3 cm hiatal hernia.- Erosive gastropathy with scattered flecks of hematin. Biopsied. This could be a potential source of anemia.- Normal examined duodenum.  Patient finished a course of PPI.   INTERVAL HISTORY Theodore Estes is a 78 y.o. male who has above history reviewed by me today presents for follow up visit for management of anemia He feels more fatigued.  Denies hematochezia, hematuria, hematemesis, epistaxis, black tarry stool or easy bruising.  He is on Aspirin 81mg  daily.    Review of Systems  Constitutional:  Negative for appetite change, chills, fatigue, fever and unexpected weight change.  HENT:   Negative for hearing loss and voice change.   Eyes:  Negative for eye problems and  icterus.  Respiratory:  Negative for chest tightness, cough and shortness of breath.   Cardiovascular:  Negative for chest pain and leg swelling.  Gastrointestinal:  Negative for abdominal distention and abdominal pain.  Endocrine: Negative for hot flashes.  Genitourinary:  Negative for difficulty urinating, dysuria and frequency.   Musculoskeletal:  Negative for arthralgias.  Skin:  Negative for itching and rash.  Neurological:  Negative for light-headedness and numbness.  Hematological:  Negative for adenopathy. Does not bruise/bleed easily.   Psychiatric/Behavioral:  Negative for confusion.     MEDICAL HISTORY:  Past Medical History:  Diagnosis Date   Anemia    Diabetes mellitus without complication (HCC)    Dyspnea    Erectile dysfunction    History of colonic polyps    Hyperlipidemia    Hypertension    Hypotestosteronism     SURGICAL HISTORY: Past Surgical History:  Procedure Laterality Date   CATARACT EXTRACTION Left    COLONOSCOPY  08/18/2003   cyst excised from right wrist  09/26/1998   VASECTOMY  09/27/1975    SOCIAL HISTORY: Social History   Socioeconomic History   Marital status: Married    Spouse name: Not on file   Number of children: Not on file   Years of education: Not on file   Highest education level: Not on file  Occupational History   Not on file  Tobacco Use   Smoking status: Never   Smokeless tobacco: Never  Vaping Use   Vaping status: Never Used  Substance and Sexual Activity   Alcohol use: No    Alcohol/week: 0.0 standard drinks of alcohol   Drug use: No   Sexual activity: Yes    Partners: Female  Other Topics Concern   Not on file  Social History Narrative   HS[employer], [employer]   Work: Immunologist   Married '69   2 daughters '76 '77   5 grandchildren   Marriage in good health   Social Drivers of Corporate investment banker Strain: Low Risk  (09/30/2022)   Overall Financial Resource Strain (CARDIA)    Difficulty of Paying Living Expenses: Not hard at all  Food Insecurity: No Food Insecurity (09/30/2022)   Hunger Vital Sign    Worried About Running Out of Food in the Last Year: Never true    Ran Out of Food in the Last Year: Never true  Transportation Needs: No Transportation Needs (09/30/2022)   PRAPARE - Administrator, Civil Service (Medical): No    Lack of Transportation (Non-Medical): No  Physical Activity: Inactive (09/30/2022)   Exercise Vital Sign    Days of Exercise per Week: 0 days    Minutes of Exercise per Session: 0 min  Stress: No Stress  Concern Present (09/30/2022)   [employer] of Occupational Health - Occupational Stress Questionnaire    Feeling of Stress : Not at all  Social Connections: Socially Integrated (09/30/2022)   Social Connection and Isolation Panel [NHANES]    Frequency of Communication with Friends and Family: More than three times a week    Frequency of Social Gatherings with Friends and Family: More than three times a week    Attends Religious Services: More than 4 times per year    Active Member of [employer] or Organizations: Yes    Attends Banker Meetings: More than 4 times per year    Marital Status: Married  Catering manager Violence: Not At Risk (09/30/2022)   Humiliation, Afraid, Rape, and Kick  questionnaire    Fear of Current or Ex-Partner: No    Emotionally Abused: No    Physically Abused: No    Sexually Abused: No    FAMILY HISTORY: Family History  Problem Relation Age of Onset   Cancer Mother        colon   Colon cancer Mother 84   Heart disease Father    Hyperlipidemia Sister    Diabetes Brother    Hypertension Brother    Prostate cancer Neg Hx    Stomach cancer Neg Hx    Esophageal cancer Neg Hx     ALLERGIES:  has no known allergies.  MEDICATIONS:  Current Outpatient Medications  Medication Sig Dispense Refill   aspirin 81 MG tablet Take 81 mg by mouth daily.     Blood Glucose Monitoring Suppl (ONE TOUCH ULTRA 2) w/Device KIT Check blood sugar twice a day and as directed. Dx. E11.65 1 each 0   cyanocobalamin  1000 MCG tablet Take 1,000 mcg by mouth daily.     lisinopril  (ZESTRIL ) 2.5 MG tablet TAKE ONE TABLET BY MOUTH ONCE A DAY 90 tablet 3   lovastatin  (MEVACOR ) 20 MG tablet TAKE ONE TABLET BY MOUTH ONCE A DAY 90 tablet 1   metFORMIN  (GLUCOPHAGE ) 1000 MG tablet TAKE ONE TABLET BY MOUTH TWICE A DAY WITH A MEAL 180 tablet 3   ONETOUCH DELICA LANCETS 33G MISC Check blood sugar twice a day and as directed. Dx. E11.65 100 each 5   ONETOUCH ULTRA TEST test strip USE TO  CHECK BLOOD SUGARS TWICE DAILY AS DIRECTED. 100 each 11   pioglitazone  (ACTOS ) 45 MG tablet Take 1 tablet (45 mg total) by mouth daily. 90 tablet 3   No current facility-administered medications for this visit.     PHYSICAL EXAMINATION: ECOG PERFORMANCE STATUS: 0 - Asymptomatic Vitals:   02/15/24 1000  BP: (!) 144/64  Pulse: 76  Resp: 18  Temp: (!) 96.2 F (35.7 C)  SpO2: 99%    Filed Weights   02/15/24 1000  Weight: 205 lb 1.6 oz (93 kg)     Physical Exam Constitutional:      General: He is not in acute distress.    Appearance: He is obese.  HENT:     Head: Normocephalic and atraumatic.  Eyes:     General: No scleral icterus. Cardiovascular:     Rate and Rhythm: Normal rate and regular rhythm.  Pulmonary:     Effort: Pulmonary effort is normal. No respiratory distress.     Breath sounds: No wheezing.  Abdominal:     General: Bowel sounds are normal. There is no distension.     Palpations: Abdomen is soft.  Musculoskeletal:        General: No deformity. Normal range of motion.     Cervical back: Normal range of motion and neck supple.  Skin:    General: Skin is warm and dry.     Findings: No erythema or rash.  Neurological:     Mental Status: He is alert and oriented to person, place, and time. Mental status is at baseline.  Psychiatric:        Mood and Affect: Mood normal.     LABORATORY DATA:  I have reviewed the data as listed Lab Results  Component Value Date   WBC 5.4 02/12/2024   HGB 10.9 (L) 02/12/2024   HCT 33.8 (L) 02/12/2024   MCV 91.6 02/12/2024   PLT 228 02/12/2024   Recent Labs    04/11/23  1052 11/29/23 1204  NA 135 142  K 4.4 4.9  CL 102 105  CO2 24 28  GLUCOSE 187* 169*  BUN 27* 32*  CREATININE 1.30* 1.50  CALCIUM 8.9 10.0  GFRNONAA 57*  --   PROT 6.8 7.3  ALBUMIN 3.7 4.6  AST 20 15  ALT 22 19  ALKPHOS 90 89  BILITOT 0.4 0.5   Iron /TIBC/Ferritin/ %Sat    Component Value Date/Time   IRON  79 02/12/2024 0800   TIBC  416 02/12/2024 0800   FERRITIN 190 02/12/2024 0800   IRONPCTSAT 19 02/12/2024 0800      RADIOGRAPHIC STUDIES: I have personally reviewed the radiological images as listed and agreed with the findings in the report. No results found.

## 2024-02-15 NOTE — Assessment & Plan Note (Signed)
 Encourage oral hydration and avoid nephrotoxins.

## 2024-03-11 ENCOUNTER — Other Ambulatory Visit: Payer: Self-pay | Admitting: Family Medicine

## 2024-03-12 DIAGNOSIS — G4733 Obstructive sleep apnea (adult) (pediatric): Secondary | ICD-10-CM | POA: Diagnosis not present

## 2024-03-27 IMAGING — DX DG LUMBAR SPINE COMPLETE 4+V
5 series · 5 of 5 positions shown · non-contrast
Comparison: None Available.

CLINICAL DATA: Low back pain extending down legs to ankles, left
worse than right.

EXAM:
LUMBAR SPINE - COMPLETE 4+ VIEW

[lumbar spine ap]
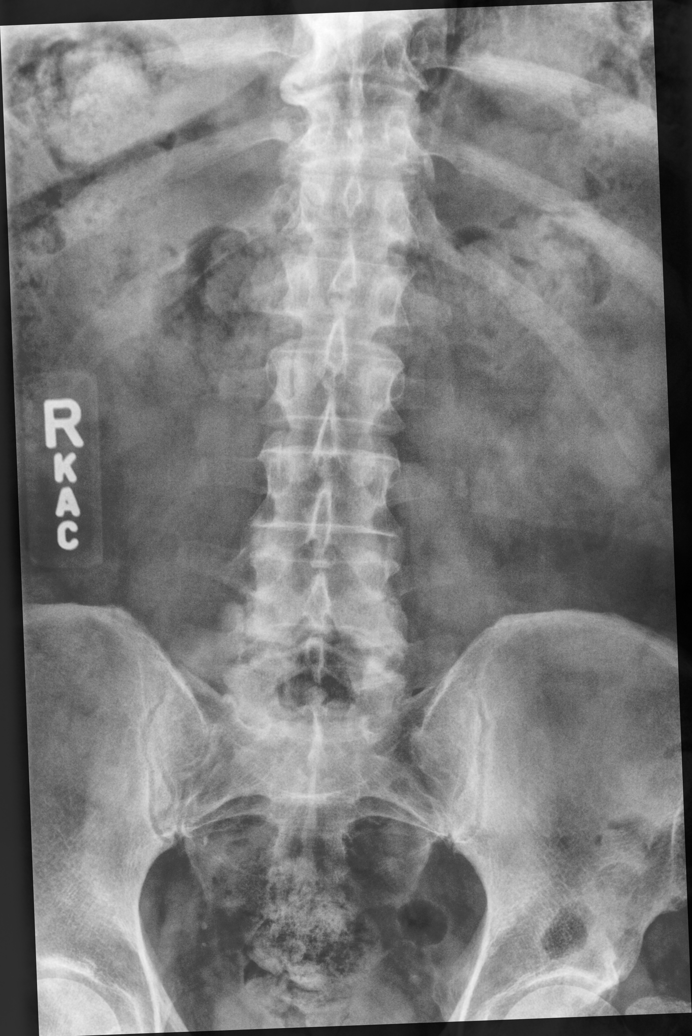

[lumbar spine mlo (1 of 2)]
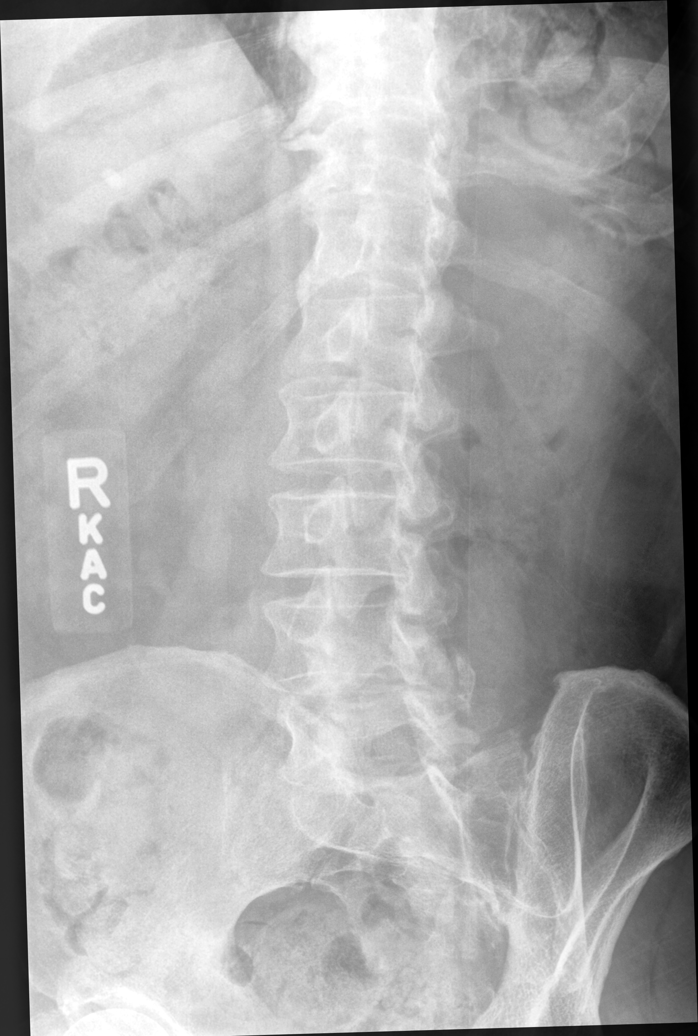

[lumbar spine mlo (2 of 2)]
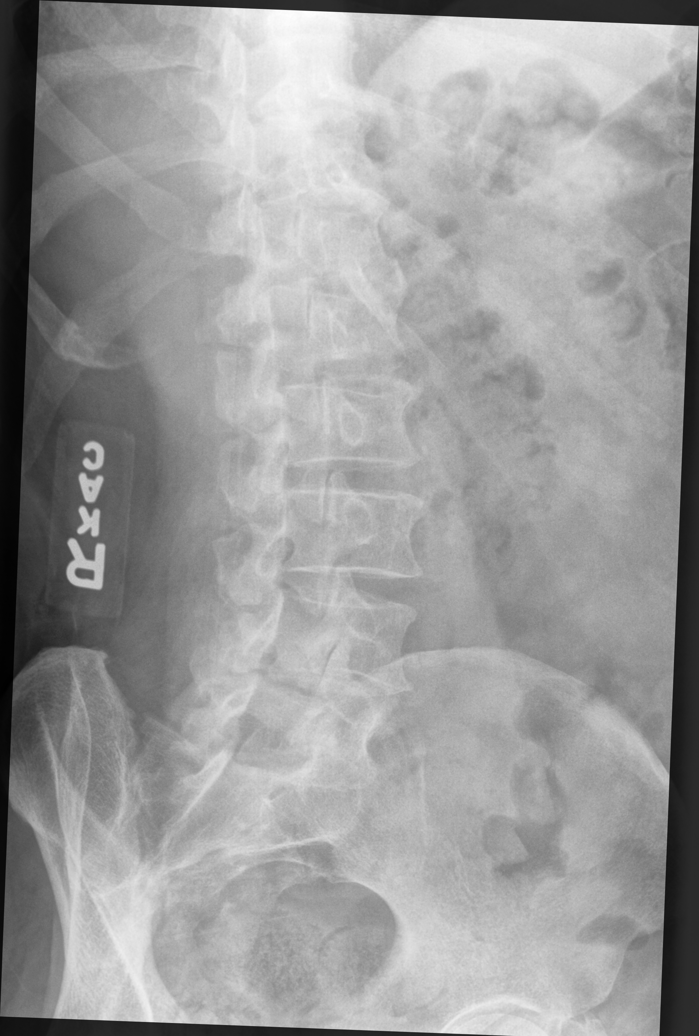

[lumbar spine lat (1 of 2)]
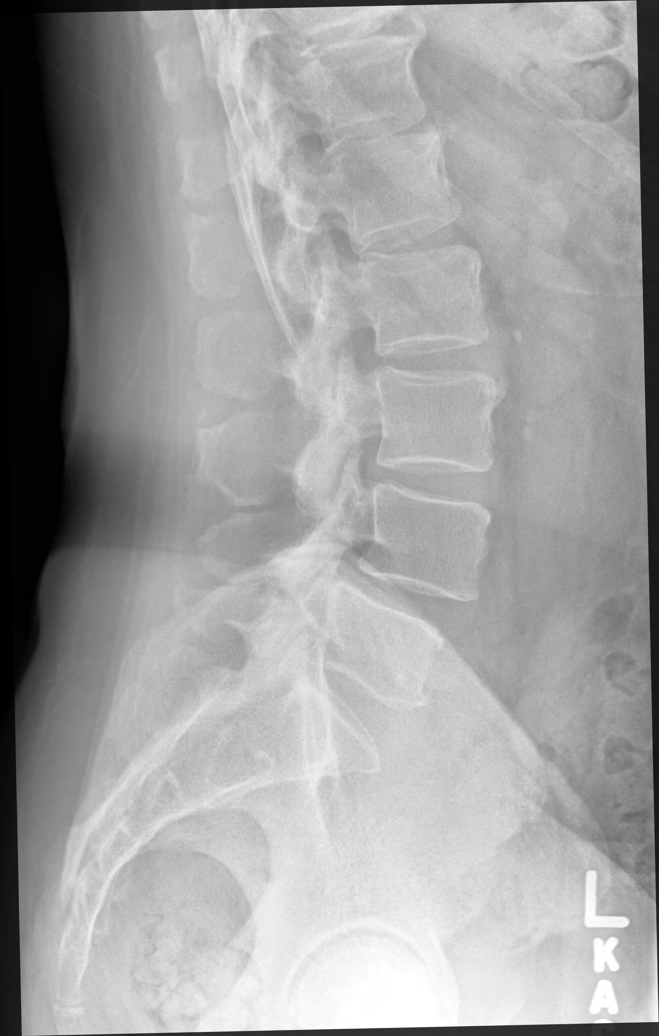

[lumbar spine lat (2 of 2)]
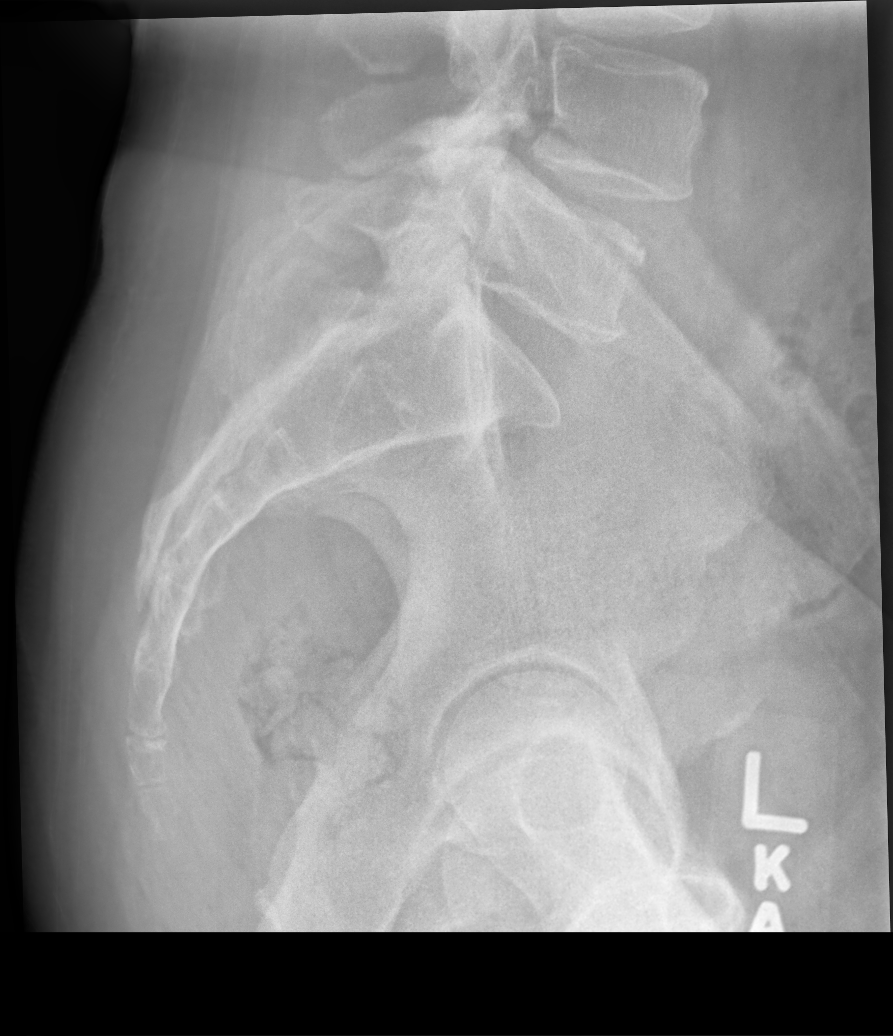

[5 of 5 positions shown; findings below may reference images not displayed]

FINDINGS: Five lumbar type vertebra. There is no acute fracture or subluxation
of the lumbar spine. Mild degenerative changes and multilevel facet
arthropathy. The visualized posterior elements are intact. The soft
tissues are unremarkable
IMPRESSION: No acute/traumatic lumbar spine pathology.

## 2024-05-10 ENCOUNTER — Encounter: Admitting: Family Medicine

## 2024-06-14 ENCOUNTER — Encounter: Payer: Self-pay | Admitting: Family Medicine

## 2024-06-14 ENCOUNTER — Ambulatory Visit: Admitting: Family Medicine

## 2024-06-14 VITALS — BP 134/68 | HR 72 | Temp 97.8°F | Ht 66.26 in | Wt 204.0 lb

## 2024-06-14 DIAGNOSIS — R972 Elevated prostate specific antigen [PSA]: Secondary | ICD-10-CM

## 2024-06-14 DIAGNOSIS — E782 Mixed hyperlipidemia: Secondary | ICD-10-CM | POA: Diagnosis not present

## 2024-06-14 DIAGNOSIS — R809 Proteinuria, unspecified: Secondary | ICD-10-CM

## 2024-06-14 DIAGNOSIS — Z7189 Other specified counseling: Secondary | ICD-10-CM

## 2024-06-14 DIAGNOSIS — Z125 Encounter for screening for malignant neoplasm of prostate: Secondary | ICD-10-CM

## 2024-06-14 DIAGNOSIS — D649 Anemia, unspecified: Secondary | ICD-10-CM | POA: Diagnosis not present

## 2024-06-14 DIAGNOSIS — E1129 Type 2 diabetes mellitus with other diabetic kidney complication: Secondary | ICD-10-CM

## 2024-06-14 DIAGNOSIS — Z0000 Encounter for general adult medical examination without abnormal findings: Secondary | ICD-10-CM

## 2024-06-14 LAB — CBC WITH DIFFERENTIAL/PLATELET
Basophils Absolute: 0 K/uL (ref 0.0–0.1)
Basophils Relative: 0.7 % (ref 0.0–3.0)
Eosinophils Absolute: 0.1 K/uL (ref 0.0–0.7)
Eosinophils Relative: 2.3 % (ref 0.0–5.0)
HCT: 35.3 % — ABNORMAL LOW (ref 39.0–52.0)
Hemoglobin: 11.7 g/dL — ABNORMAL LOW (ref 13.0–17.0)
Lymphocytes Relative: 25.6 % (ref 12.0–46.0)
Lymphs Abs: 1.6 K/uL (ref 0.7–4.0)
MCHC: 33.2 g/dL (ref 30.0–36.0)
MCV: 88.7 fl (ref 78.0–100.0)
Monocytes Absolute: 0.5 K/uL (ref 0.1–1.0)
Monocytes Relative: 7.4 % (ref 3.0–12.0)
Neutro Abs: 4 K/uL (ref 1.4–7.7)
Neutrophils Relative %: 64 % (ref 43.0–77.0)
Platelets: 232 K/uL (ref 150.0–400.0)
RBC: 3.98 Mil/uL — ABNORMAL LOW (ref 4.22–5.81)
RDW: 14 % (ref 11.5–15.5)
WBC: 6.3 K/uL (ref 4.0–10.5)

## 2024-06-14 LAB — MICROALBUMIN / CREATININE URINE RATIO
Creatinine,U: 81.6 mg/dL
Microalb Creat Ratio: 72.7 mg/g — ABNORMAL HIGH (ref 0.0–30.0)
Microalb, Ur: 5.9 mg/dL — ABNORMAL HIGH (ref 0.0–1.9)

## 2024-06-14 LAB — COMPREHENSIVE METABOLIC PANEL WITH GFR
ALT: 22 U/L (ref 0–53)
AST: 19 U/L (ref 0–37)
Albumin: 4.4 g/dL (ref 3.5–5.2)
Alkaline Phosphatase: 80 U/L (ref 39–117)
BUN: 21 mg/dL (ref 6–23)
CO2: 30 meq/L (ref 19–32)
Calcium: 9.7 mg/dL (ref 8.4–10.5)
Chloride: 104 meq/L (ref 96–112)
Creatinine, Ser: 1.23 mg/dL (ref 0.40–1.50)
GFR: 56.52 mL/min — ABNORMAL LOW (ref >60.00)
Glucose, Bld: 116 mg/dL — ABNORMAL HIGH (ref 70–99)
Potassium: 4.6 meq/L (ref 3.5–5.1)
Sodium: 141 meq/L (ref 135–145)
Total Bilirubin: 0.5 mg/dL (ref 0.2–1.2)
Total Protein: 6.8 g/dL (ref 6.0–8.3)

## 2024-06-14 LAB — HEMOGLOBIN A1C: Hgb A1c MFr Bld: 8.1 % — ABNORMAL HIGH (ref 4.6–6.5)

## 2024-06-14 LAB — FERRITIN: Ferritin: 240.5 ng/mL (ref 22.0–322.0)

## 2024-06-14 LAB — LIPID PANEL
Cholesterol: 142 mg/dL (ref 0–200)
HDL: 56.7 mg/dL (ref >39.00)
LDL Cholesterol: 62 mg/dL (ref 0–99)
NonHDL: 85.33
Total CHOL/HDL Ratio: 3
Triglycerides: 116 mg/dL (ref 0.0–149.0)
VLDL: 23.2 mg/dL (ref 0.0–40.0)

## 2024-06-14 LAB — TSH: TSH: 3.15 u[IU]/mL (ref 0.35–5.50)

## 2024-06-14 LAB — PSA, MEDICARE: PSA: 4.36 ng/mL — ABNORMAL HIGH (ref 0.10–4.00)

## 2024-06-14 LAB — VITAMIN B12: Vitamin B-12: 526 pg/mL (ref 211–911)

## 2024-06-14 NOTE — Progress Notes (Signed)
 Diabetes:  Using medications without difficulties:yes Hypoglycemic episodes:no Hyperglycemic episodes:no Feet problems: no Blood Sugars averaging: usually ~140s eye exam within last year: yes, Dr. Mevelyn in [place].  Labs pending.   Elevated Cholesterol: Using medications without problems: yes Muscle aches: no Diet compliance: he is working on diet.  Exercise: yes Labs pending.   History of anemia.  Recheck CBC and B12 and ferritin pending.  Previous hematology evaluation.  See notes on labs.  Flu 2025 Shingles 2020 PNA up-to-date covid prev done, d/w pt.  Tetanus 2013, d/w pt.   RSV vaccine d/w pt in general.   Colonoscopy 2022 Prostate cancer screening 2025- PSA was elevated 2025, d/w pt about repeat.   Advance directive wife designated if patient were incapacitated.  PMH and SH reviewed.   Vital signs, Meds and allergies reviewed.  ROS: Per HPI unless specifically indicated in ROS section   GEN: nad, alert and oriented HEENT: mucous membranes moist NECK: supple w/o LA CV: rrr PULM: ctab, no inc wob ABD: soft, +bs EXT: no edema SKIN: no acute rash  Diabetic foot exam: Normal inspection No skin breakdown No calluses  Normal DP pulses Normal sensation to light tough and monofilament Nails normal except for changes on the L1st nail after injury years ago.

## 2024-06-14 NOTE — Patient Instructions (Addendum)
 Go to the lab on the way out.   If you have mychart we'll likely use that to update you.    Take care.  Glad to see you. Plan on recheck in about 6 months. Labs at the visit.  Flu shot today.

## 2024-06-16 ENCOUNTER — Ambulatory Visit: Payer: Self-pay | Admitting: Family Medicine

## 2024-06-16 DIAGNOSIS — R972 Elevated prostate specific antigen [PSA]: Secondary | ICD-10-CM | POA: Insufficient documentation

## 2024-06-16 DIAGNOSIS — D649 Anemia, unspecified: Secondary | ICD-10-CM | POA: Insufficient documentation

## 2024-06-16 MED ORDER — EMPAGLIFLOZIN 10 MG PO TABS: 10.0000 mg | ORAL_TABLET | Freq: Every day | ORAL | 3 refills | Status: AC

## 2024-06-16 NOTE — Assessment & Plan Note (Signed)
 Flu 2025 Shingles 2020 PNA up-to-date covid prev done, d/w pt.  Tetanus 2013, d/w pt.   RSV vaccine d/w pt in general.   Colonoscopy 2022 Prostate cancer screening 2025- PSA was elevated 2025, d/w pt about repeat.   Advance directive wife designated if patient were incapacitated.

## 2024-06-16 NOTE — Assessment & Plan Note (Signed)
 Recheck CBC and B12 and ferritin pending.  Previous hematology evaluation.  See notes on labs.

## 2024-06-16 NOTE — Assessment & Plan Note (Signed)
 Continue lovastatin .  Diet and exercise discussed.  See notes on labs.

## 2024-06-16 NOTE — Assessment & Plan Note (Signed)
 Prostate cancer screening 2025- PSA was elevated 2025, d/w pt about repeat.

## 2024-06-16 NOTE — Assessment & Plan Note (Signed)
 See notes on labs.  Continue lisinopril  lovastatin  metformin  Actos .

## 2024-06-16 NOTE — Assessment & Plan Note (Signed)
 Advance directive- wife designated if patient were incapacitated.

## 2024-06-24 DIAGNOSIS — G4733 Obstructive sleep apnea (adult) (pediatric): Secondary | ICD-10-CM | POA: Diagnosis not present

## 2024-07-17 ENCOUNTER — Encounter: Payer: Self-pay | Admitting: Urology

## 2024-07-17 ENCOUNTER — Ambulatory Visit (INDEPENDENT_AMBULATORY_CARE_PROVIDER_SITE_OTHER): Admitting: Urology

## 2024-07-17 ENCOUNTER — Ambulatory Visit: Admitting: Sleep Medicine

## 2024-07-17 ENCOUNTER — Encounter: Payer: Self-pay | Admitting: Sleep Medicine

## 2024-07-17 VITALS — BP 110/70 | HR 86 | Temp 98.6°F | Ht 66.0 in | Wt 194.6 lb

## 2024-07-17 VITALS — BP 127/75 | HR 69 | Ht 66.0 in | Wt 190.0 lb

## 2024-07-17 DIAGNOSIS — G4733 Obstructive sleep apnea (adult) (pediatric): Secondary | ICD-10-CM

## 2024-07-17 DIAGNOSIS — N402 Nodular prostate without lower urinary tract symptoms: Secondary | ICD-10-CM | POA: Diagnosis not present

## 2024-07-17 DIAGNOSIS — R972 Elevated prostate specific antigen [PSA]: Secondary | ICD-10-CM

## 2024-07-17 DIAGNOSIS — I10 Essential (primary) hypertension: Secondary | ICD-10-CM | POA: Diagnosis not present

## 2024-07-17 NOTE — Progress Notes (Signed)
 07/17/2024 8:31 AM   Theodore Estes 1946/09/08 [id number]  Referring provider: Cleatus Arlyss RAMAN, MD [address] [place],  [place] [address]  Chief Complaint  Patient presents with   Elevated PSA    HPI: Theodore Estes is a 78 y.o. male referred for evaluation of an elevated PSA.  PSA levels: 4.70 on 11/29/2023; 4.36 on 06/14/2024 Baseline PSA level: See below Lower urinary tract symptoms: No bothersome LUTS Family history prostate cancer first-degree relatives: Negative Past urologic history: Negative  PSA trend    Prostate Specific Ag, Serum  Latest Ref Rng 0.0 - 4.0 ng/mL  05/13/2019 3.54  08/17/2020 4.06  05/21/2021 2.73  10/20/2022 2.85  11/29/2023 4.70  06/14/2024 4.36     PMH: Past Medical History:  Diagnosis Date   Anemia    Diabetes mellitus without complication (HCC)    Dyspnea    Erectile dysfunction    History of colonic polyps    Hyperlipidemia    Hypertension    Hypotestosteronism     Surgical History: Past Surgical History:  Procedure Laterality Date   CATARACT EXTRACTION Left    COLONOSCOPY  08/18/2003   cyst excised from right wrist  09/26/1998   VASECTOMY  09/27/1975    Home Medications:  Allergies as of 07/17/2024   No Known Allergies      Medication List        Accurate as of July 17, 2024  8:31 AM. If you have any questions, ask your nurse or doctor.          aspirin 81 MG tablet Take 81 mg by mouth daily.   cyanocobalamin  1000 MCG tablet Take 1,000 mcg by mouth daily.   empagliflozin  10 MG Tabs tablet Commonly known as: JARDIANCE  Take 1 tablet (10 mg total) by mouth daily.   lisinopril  2.5 MG tablet Commonly known as: ZESTRIL  TAKE ONE TABLET BY MOUTH ONCE A DAY   lovastatin  20 MG tablet Commonly known as: MEVACOR  TAKE ONE TABLET BY MOUTH ONCE A DAY   metFORMIN  1000 MG tablet Commonly known as: GLUCOPHAGE  TAKE ONE TABLET BY MOUTH TWICE A DAY WITH A MEAL   ONE TOUCH ULTRA 2 w/Device Kit Check blood sugar  twice a day and as directed. Dx. E11.65   OneTouch Delica Lancets 33G Misc Check blood sugar twice a day and as directed. Dx. E11.65   OneTouch Ultra Test test strip Generic drug: glucose blood USE TO CHECK BLOOD SUGARS TWICE DAILY AS DIRECTED.   pioglitazone  45 MG tablet Commonly known as: ACTOS  TAKE ONE TABLET (45 MG TOTAL) BY MOUTH DAILY.        Allergies: No Known Allergies  Family History: Family History  Problem Relation Age of Onset   Cancer Mother        colon   Colon cancer Mother 70   Heart disease Father    Hyperlipidemia Sister    Diabetes Brother    Hypertension Brother    Prostate cancer Neg Hx    Stomach cancer Neg Hx    Esophageal cancer Neg Hx     Social History:  reports that he has never smoked. He has never used smokeless tobacco. He reports that he does not drink alcohol and does not use drugs.   Physical Exam: BP 127/75   Pulse 69   Ht 5' 6 (1.676 m)   Wt 190 lb (86.2 kg)   BMI 30.67 kg/m   Constitutional:  Alert and oriented, No acute distress. HEENT:  AT  Respiratory: Normal respiratory effort, no increased work of breathing. GU: Prostate 35g, increased firmness superior aspect right prostate Psychiatric: Normal mood and affect.    Assessment & Plan:    1.  Elevated PSA Mild PSA elevation; normal by age-specific guidelines Abnormal DRE Recommend further evaluation with prostate MRI   Theodore JAYSON Barba, MD  [facility] [address], [address] [place], [place] [address] [phone]

## 2024-07-17 NOTE — Progress Notes (Signed)
 Name:Theodore Estes MRN: [id number] DOB: [birth year]   CHIEF COMPLAINT:  CPAP F/U   HISTORY OF PRESENT ILLNESS: Theodore Estes is a 78 y.o. w/ a h/o OSA, DMII, HTN and obesity who presents for CPAP follow up visit. Reports using CPAP therapy every night, which is confirmed by compliance data. He is currently using the Airfit F30i FFM, which is comfortable. Reports feeling unrefreshed upon awakening with CPAP therapy.     EPWORTH SLEEP SCORE 8    07/03/2023    8:50 AM 05/27/2022    9:16 AM 07/12/2021    3:00 PM  Results of the Epworth flowsheet  Sitting and reading 2 3 3   Watching TV 2 3 3   Sitting, inactive in a public place (e.g. a theatre or a meeting) 0 1 2  As a passenger in a car for an hour without a break 1 1 1   Lying down to rest in the afternoon when circumstances permit 2 1 2   Sitting and talking to someone 0 0 1  Sitting quietly after a lunch without alcohol 1 0 3  In a car, while stopped for a few minutes in traffic 0 0 2  Total score 8 9 17     PAST MEDICAL HISTORY :   has a past medical history of Anemia, Diabetes mellitus without complication (HCC), Dyspnea, Erectile dysfunction, History of colonic polyps, Hyperlipidemia, Hypertension, and Hypotestosteronism.  has a past surgical history that includes Colonoscopy (08/18/2003); Vasectomy (09/27/1975); cyst excised from right wrist (09/26/1998); and Cataract extraction (Left). Prior to Admission medications   Medication Sig Start Date End Date Taking? Authorizing Provider  aspirin 81 MG tablet Take 81 mg by mouth daily.   Yes [provider]  Blood Glucose Monitoring Suppl (ONE TOUCH ULTRA 2) w/Device KIT Check blood sugar twice a day and as directed. Dx. E11.65 05/31/18  Yes Cleatus Arlyss RAMAN, MD  cyanocobalamin  1000 MCG tablet Take 1,000 mcg by mouth daily.   Yes [provider]  empagliflozin  (JARDIANCE ) 10 MG TABS tablet Take 1 tablet (10 mg total) by mouth daily. 06/16/24  Yes Cleatus Arlyss RAMAN,  MD  lisinopril  (ZESTRIL ) 2.5 MG tablet TAKE ONE TABLET BY MOUTH ONCE A DAY 10/18/23  Yes Cleatus Arlyss RAMAN, MD  lovastatin  (MEVACOR ) 20 MG tablet TAKE ONE TABLET BY MOUTH ONCE A DAY 03/11/24  Yes Cleatus Arlyss RAMAN, MD  metFORMIN  (GLUCOPHAGE ) 1000 MG tablet TAKE ONE TABLET BY MOUTH TWICE A DAY WITH A MEAL 11/16/23  Yes Cleatus Arlyss RAMAN, MD  [facility] DELICA LANCETS 33G MISC Check blood sugar twice a day and as directed. Dx. E11.65 12/04/15  Yes Cleatus Arlyss RAMAN, MD  [facility] ULTRA TEST test strip USE TO CHECK BLOOD SUGARS TWICE DAILY AS DIRECTED. 12/18/23  Yes Cleatus Arlyss RAMAN, MD  pioglitazone  (ACTOS ) 45 MG tablet TAKE ONE TABLET (45 MG TOTAL) BY MOUTH DAILY. 03/11/24  Yes Cleatus Arlyss RAMAN, MD   No Known Allergies  FAMILY HISTORY:  family history includes Cancer in his mother; Colon cancer (age of onset: 39) in his mother; Diabetes in his brother; Heart disease in his father; Hyperlipidemia in his sister; Hypertension in his brother. SOCIAL HISTORY:  reports that he has never smoked. He has never used smokeless tobacco. He reports that he does not drink alcohol and does not use drugs.   Review of Systems:  Gen:  Denies  fever, sweats, chills weight loss  HEENT: Denies blurred vision, double vision, ear pain, eye pain, hearing loss,  nose bleeds, sore throat Cardiac:  No dizziness, chest pain or heaviness, chest tightness,edema, No JVD Resp:   No cough, -sputum production, -shortness of breath,-wheezing, -hemoptysis,  Gi: Denies swallowing difficulty, stomach pain, nausea or vomiting, diarrhea, constipation, bowel incontinence Gu:  Denies bladder incontinence, burning urine Ext:   Denies Joint pain, stiffness or swelling Skin: Denies  skin rash, easy bruising or bleeding or hives Endoc:  Denies polyuria, polydipsia , polyphagia or weight change Psych:   Denies depression, insomnia or hallucinations  Other:  All other systems negative  VITAL SIGNS: BP 110/70   Pulse 86   Temp 98.6 F (37 C)    Ht 5' 6 (1.676 m)   Wt 194 lb 9.6 oz (88.3 kg)   SpO2 97%   BMI 31.41 kg/m    Physical Examination:   General Appearance: No distress  EYES PERRLA, EOM intact.   NECK Supple, No JVD Pulmonary: normal breath sounds, No wheezing.  CardiovascularNormal S1,S2.  No m/r/g.   Abdomen: Benign, Soft, non-tender. Skin:   warm, no rashes, no ecchymosis  Extremities: normal, no cyanosis, clubbing. Neuro:without focal findings,  speech normal  PSYCHIATRIC: Mood, affect within normal limits.   ASSESSMENT AND PLAN  OSA Patient is using and benefiting from CPAP therapy. Discussed the consequences of untreated sleep apnea. Advised not to drive drowsy for safety of patient and others. Will follow up in 1 year.    HTN Stable, on current management. Following with PCP.    Patient  satisfied with Plan of action and management. All questions answered  I spent a total of 24 minutes reviewing chart data, face-to-face evaluation with the patient, counseling and coordination of care as detailed above.    Caeden Foots, M.D.  Sleep Medicine [facility]

## 2024-07-17 NOTE — Patient Instructions (Signed)
 Please contact Central Scheduling to set up your prostate MRI at [phone].  Prostate MRI Prep:  1- No ejaculation 48 hours prior to exam  2- No caffeine or carbonated beverages on day of the exam  3- Eat light diet evening prior and day of exam  4- Avoid eating 4 hours prior to exam  5- Fleets enema needs to be done 4 hours prior to exam -See below. Can be purchased at the drug store.

## 2024-07-17 NOTE — Patient Instructions (Signed)

## 2024-07-24 ENCOUNTER — Ambulatory Visit
Admission: RE | Admit: 2024-07-24 | Discharge: 2024-07-24 | Disposition: A | Discharge status: Home / Self Care | Source: Ambulatory Visit | Attending: Urology | Admitting: Urology

## 2024-07-24 DIAGNOSIS — N4289 Other specified disorders of prostate: Secondary | ICD-10-CM | POA: Diagnosis not present

## 2024-07-24 DIAGNOSIS — R972 Elevated prostate specific antigen [PSA]: Secondary | ICD-10-CM | POA: Diagnosis not present

## 2024-07-24 MED ORDER — GADOBUTROL 1 MMOL/ML IV SOLN
10.0000 mL | Freq: Once | INTRAVENOUS | Status: AC | PRN
  Administered 2024-07-24: 10 mL via INTRAVENOUS

## 2024-07-26 ENCOUNTER — Ambulatory Visit: Payer: Self-pay | Admitting: Urology

## 2024-07-29 ENCOUNTER — Encounter: Payer: Self-pay | Admitting: *Deleted

## 2024-07-29 ENCOUNTER — Telehealth: Payer: Self-pay | Admitting: *Deleted

## 2024-07-29 NOTE — Telephone Encounter (Signed)
 Sent clearance to stop aspirin 81  5 to 7 day prior to  Dr. Cleatus Per patient wife . He has not seen cardiologist since 2017.

## 2024-08-02 NOTE — Telephone Encounter (Signed)
 Called patient to let him know his PCP approved the stop of his blood thinner (aspirin 81mg ) 5-7 days prior to his fusion biopsy appointment on 08-28-2024. He understands he should stop his medication a week prior. Pt had no further questions.-Jeren Dufrane, CMA.

## 2024-08-12 ENCOUNTER — Inpatient Hospital Stay: Attending: Oncology

## 2024-08-12 DIAGNOSIS — N183 Chronic kidney disease, stage 3 unspecified: Secondary | ICD-10-CM | POA: Insufficient documentation

## 2024-08-12 DIAGNOSIS — Z800 Family history of malignant neoplasm of digestive organs: Secondary | ICD-10-CM | POA: Diagnosis not present

## 2024-08-12 DIAGNOSIS — D631 Anemia in chronic kidney disease: Secondary | ICD-10-CM | POA: Diagnosis not present

## 2024-08-12 LAB — IRON AND TIBC
Iron: 89 ug/dL (ref 45–182)
Saturation Ratios: 23 % (ref 17.9–39.5)
TIBC: 381 ug/dL (ref 250–450)
UIBC: 292 ug/dL

## 2024-08-12 LAB — CBC WITH DIFFERENTIAL (CANCER CENTER ONLY)
Abs Immature Granulocytes: 0.06 K/uL (ref 0.00–0.07)
Basophils Absolute: 0.1 K/uL (ref 0.0–0.1)
Basophils Relative: 1 %
Eosinophils Absolute: 0.2 K/uL (ref 0.0–0.5)
Eosinophils Relative: 3 %
HCT: 34.6 % — ABNORMAL LOW (ref 39.0–52.0)
Hemoglobin: 11.1 g/dL — ABNORMAL LOW (ref 13.0–17.0)
Immature Granulocytes: 1 %
Lymphocytes Relative: 27 %
Lymphs Abs: 1.7 K/uL (ref 0.7–4.0)
MCH: 29.2 pg (ref 26.0–34.0)
MCHC: 32.1 g/dL (ref 30.0–36.0)
MCV: 91.1 fL (ref 80.0–100.0)
Monocytes Absolute: 0.7 K/uL (ref 0.1–1.0)
Monocytes Relative: 10 %
Neutro Abs: 3.8 K/uL (ref 1.7–7.7)
Neutrophils Relative %: 58 %
Platelet Count: 237 K/uL (ref 150–400)
RBC: 3.8 MIL/uL — ABNORMAL LOW (ref 4.22–5.81)
RDW: 14.9 % (ref 11.5–15.5)
WBC Count: 6.5 K/uL (ref 4.0–10.5)
nRBC: 0 % (ref 0.0–0.2)

## 2024-08-12 LAB — RETIC PANEL
Immature Retic Fract: 14.4 % (ref 2.3–15.9)
RBC.: 3.81 MIL/uL — ABNORMAL LOW (ref 4.22–5.81)
Retic Count, Absolute: 67.4 K/uL (ref 19.0–186.0)
Retic Ct Pct: 1.8 % (ref 0.4–3.1)
Reticulocyte Hemoglobin: 32.2 pg (ref >27.9)

## 2024-08-12 LAB — FERRITIN: Ferritin: 330 ng/mL (ref 24–336)

## 2024-08-14 ENCOUNTER — Inpatient Hospital Stay

## 2024-08-14 ENCOUNTER — Inpatient Hospital Stay (HOSPITAL_BASED_OUTPATIENT_CLINIC_OR_DEPARTMENT_OTHER): Admitting: Oncology

## 2024-08-14 ENCOUNTER — Encounter: Payer: Self-pay | Admitting: Oncology

## 2024-08-14 VITALS — BP 147/84 | HR 90 | Temp 97.8°F | Resp 16 | Wt 199.0 lb

## 2024-08-14 DIAGNOSIS — N1831 Chronic kidney disease, stage 3a: Secondary | ICD-10-CM | POA: Diagnosis not present

## 2024-08-14 DIAGNOSIS — N183 Chronic kidney disease, stage 3 unspecified: Secondary | ICD-10-CM | POA: Diagnosis not present

## 2024-08-14 DIAGNOSIS — D631 Anemia in chronic kidney disease: Secondary | ICD-10-CM | POA: Diagnosis not present

## 2024-08-14 NOTE — Progress Notes (Signed)
 Hematology/Oncology Progress Note [phone] [phone]   Patient Care Team: Cleatus Arlyss RAMAN, MD as PCP - General (Family Medicine) Perla Evalene PARAS, MD as Consulting Physician (Cardiology) Mevelyn JONETTA Bathe, OD (Optometry) Babara Call, MD as Consulting Physician (Oncology)  CHIEF COMPLAINTS/REASON FOR VISIT:  Follow up with anemia due to CKD   ASSESSMENT & PLAN:   Anemia in chronic kidney disease (CKD) Labs are reviewed and discussed with patient. Lab Results  Component Value Date   HGB 11.1 (L) 08/12/2024   TIBC 381 08/12/2024   IRONPCTSAT 23 08/12/2024   FERRITIN 330 08/12/2024   Goal of ferritin is 200. Hold Venofer  Hb is >10, no need for epo    CKD (chronic kidney disease) stage 3, GFR 30-59 ml/min (HCC) Encourage oral hydration and avoid nephrotoxins.    Orders Placed This Encounter  Procedures   CBC with Differential (Cancer Center Only)    Standing Status:   Future    Expected Date:   02/11/2025    Expiration Date:   05/12/2025   Iron  and TIBC    Standing Status:   Future    Expected Date:   02/11/2025    Expiration Date:   05/12/2025   Ferritin    Standing Status:   Future    Expected Date:   02/11/2025    Expiration Date:   05/12/2025   Retic Panel    Standing Status:   Future    Expected Date:   02/11/2025    Expiration Date:   05/12/2025   Follow up in 6 months.  All questions were answered. The patient knows to call the clinic with any problems, questions or concerns.  Call Babara, MD, PhD [facility] Hematology Oncology 08/14/2024   HISTORY OF PRESENTING ILLNESS:   Theodore Estes is a  78 y.o.  male with PMH listed below was seen in consultation at the request of  Cleatus Arlyss RAMAN, MD  for evaluation of anemia  06/03/2021 CBC showed hemoglobin 11.4, mcv 88.1, normal wbc, platelt.  Reviewed previous labs.  Anemia onset was since August 2022.  He denies any black or bloody stool. Last colonoscopy was on 01/09/2014.  Sessile polyp  measuring 3 mm in size was found in the sigmoid colon.  Polyp was resected. Pathology showed benign colon mucosa with lymphoid aggregates.  08/27/2021 colonoscopy showed  5 mm polyp in the descending colon, removed with a cold snare. Resected and retrieved.One 7 mm polyp at the splenic flexure, removed with a cold snare and biopsy forceps.Resected and retrieved.- Diverticulosis in the sigmoid colon and in the descending colon.  08/27/21 Upper endoscopy showed the examined portions of the nasopharynx, oropharynx and larynx were normal.- LA Grade A reflux esophagitis with no bleeding.- Gastroesophageal flap valve classified as Hill Grade IV (no fold, wide open lumen, hiatal hernia present). - 3 cm hiatal hernia.- Erosive gastropathy with scattered flecks of hematin. Biopsied. This could be a potential source of anemia.- Normal examined duodenum.  Patient finished a course of PPI.   INTERVAL HISTORY Theodore Estes is a 78 y.o. male who has above history reviewed by me today presents for follow up visit for management of anemia He feels well today Denies hematochezia, hematuria, hematemesis, epistaxis, black tarry stool or easy bruising.  He is on Aspirin 81mg  daily.    Review of Systems  Constitutional:  Negative for appetite change, chills, fatigue, fever and unexpected weight change.  HENT:   Negative for hearing loss and voice change.  Eyes:  Negative for eye problems and icterus.  Respiratory:  Negative for chest tightness, cough and shortness of breath.   Cardiovascular:  Negative for chest pain and leg swelling.  Gastrointestinal:  Negative for abdominal distention and abdominal pain.  Endocrine: Negative for hot flashes.  Genitourinary:  Negative for difficulty urinating, dysuria and frequency.   Musculoskeletal:  Negative for arthralgias.  Skin:  Negative for itching and rash.  Neurological:  Negative for light-headedness and numbness.  Hematological:  Negative for adenopathy. Does  not bruise/bleed easily.  Psychiatric/Behavioral:  Negative for confusion.     MEDICAL HISTORY:  Past Medical History:  Diagnosis Date   Anemia    Diabetes mellitus without complication (HCC)    Dyspnea    Erectile dysfunction    History of colonic polyps    Hyperlipidemia    Hypertension    Hypotestosteronism     SURGICAL HISTORY: Past Surgical History:  Procedure Laterality Date   CATARACT EXTRACTION Left    COLONOSCOPY  08/18/2003   cyst excised from right wrist  09/26/1998   VASECTOMY  09/27/1975    SOCIAL HISTORY: Social History   Socioeconomic History   Marital status: Married    Spouse name: Not on file   Number of children: Not on file   Years of education: Not on file   Highest education level: Not on file  Occupational History   Not on file  Tobacco Use   Smoking status: Never   Smokeless tobacco: Never  Vaping Use   Vaping status: Never Used  Substance and Sexual Activity   Alcohol use: No    Alcohol/week: 0.0 standard drinks of alcohol   Drug use: No   Sexual activity: Yes    Partners: Female  Other Topics Concern   Not on file  Social History Narrative   HS[employer], [employer]   Work: immunologist   Married '69   2 daughters '76 '77   5 grandchildren   Marriage in good health   Social Drivers of Corporate Investment Banker Strain: Low Risk  (09/30/2022)   Overall Financial Resource Strain (CARDIA)    Difficulty of Paying Living Expenses: Not hard at all  Food Insecurity: No Food Insecurity (09/30/2022)   Hunger Vital Sign    Worried About Running Out of Food in the Last Year: Never true    Ran Out of Food in the Last Year: Never true  Transportation Needs: No Transportation Needs (09/30/2022)   PRAPARE - Administrator, Civil Service (Medical): No    Lack of Transportation (Non-Medical): No  Physical Activity: Inactive (09/30/2022)   Exercise Vital Sign    Days of Exercise per Week: 0 days    Minutes of Exercise per Session: 0 min   Stress: No Stress Concern Present (09/30/2022)   [employer] of Occupational Health - Occupational Stress Questionnaire    Feeling of Stress : Not at all  Social Connections: Socially Integrated (09/30/2022)   Social Connection and Isolation Panel    Frequency of Communication with Friends and Family: More than three times a week    Frequency of Social Gatherings with Friends and Family: More than three times a week    Attends Religious Services: More than 4 times per year    Active Member of [employer] or Organizations: Yes    Attends Banker Meetings: More than 4 times per year    Marital Status: Married  Catering Manager Violence: Not At Risk (09/30/2022)  Humiliation, Afraid, Rape, and Kick questionnaire    Fear of Current or Ex-Partner: No    Emotionally Abused: No    Physically Abused: No    Sexually Abused: No    FAMILY HISTORY: Family History  Problem Relation Age of Onset   Cancer Mother        colon   Colon cancer Mother 74   Heart disease Father    Hyperlipidemia Sister    Diabetes Brother    Hypertension Brother    Prostate cancer Neg Hx    Stomach cancer Neg Hx    Esophageal cancer Neg Hx     ALLERGIES:  has no known allergies.  MEDICATIONS:  Current Outpatient Medications  Medication Sig Dispense Refill   aspirin 81 MG tablet Take 81 mg by mouth daily.     Blood Glucose Monitoring Suppl (ONE TOUCH ULTRA 2) w/Device KIT Check blood sugar twice a day and as directed. Dx. E11.65 1 each 0   cyanocobalamin  1000 MCG tablet Take 1,000 mcg by mouth daily.     empagliflozin  (JARDIANCE ) 10 MG TABS tablet Take 1 tablet (10 mg total) by mouth daily. 90 tablet 3   lisinopril  (ZESTRIL ) 2.5 MG tablet TAKE ONE TABLET BY MOUTH ONCE A DAY 90 tablet 3   lovastatin  (MEVACOR ) 20 MG tablet TAKE ONE TABLET BY MOUTH ONCE A DAY 90 tablet 1   metFORMIN  (GLUCOPHAGE ) 1000 MG tablet TAKE ONE TABLET BY MOUTH TWICE A DAY WITH A MEAL 180 tablet 3   ONETOUCH DELICA LANCETS 33G  MISC Check blood sugar twice a day and as directed. Dx. E11.65 100 each 5   ONETOUCH ULTRA TEST test strip USE TO CHECK BLOOD SUGARS TWICE DAILY AS DIRECTED. 100 each 11   pioglitazone  (ACTOS ) 45 MG tablet TAKE ONE TABLET (45 MG TOTAL) BY MOUTH DAILY. 90 tablet 3   No current facility-administered medications for this visit.     PHYSICAL EXAMINATION: ECOG PERFORMANCE STATUS: 0 - Asymptomatic Vitals:   08/14/24 1036  BP: (!) 147/84  Pulse: 90  Resp: 16  Temp: 97.8 F (36.6 C)  SpO2: 97%    Filed Weights   08/14/24 1036  Weight: 199 lb (90.3 kg)     Physical Exam Constitutional:      General: He is not in acute distress.    Appearance: He is obese.  HENT:     Head: Normocephalic and atraumatic.  Eyes:     General: No scleral icterus. Cardiovascular:     Rate and Rhythm: Normal rate and regular rhythm.  Pulmonary:     Effort: Pulmonary effort is normal. No respiratory distress.     Breath sounds: No wheezing.  Abdominal:     General: Bowel sounds are normal. There is no distension.     Palpations: Abdomen is soft.  Musculoskeletal:        General: No deformity. Normal range of motion.     Cervical back: Normal range of motion and neck supple.  Skin:    General: Skin is warm and dry.     Findings: No erythema or rash.  Neurological:     Mental Status: He is alert and oriented to person, place, and time. Mental status is at baseline.  Psychiatric:        Mood and Affect: Mood normal.     LABORATORY DATA:  I have reviewed the data as listed Lab Results  Component Value Date   WBC 6.5 08/12/2024   HGB 11.1 (L) 08/12/2024  HCT 34.6 (L) 08/12/2024   MCV 91.1 08/12/2024   PLT 237 08/12/2024   Recent Labs    11/29/23 1204 06/14/24 1147  NA 142 141  K 4.9 4.6  CL 105 104  CO2 28 30  GLUCOSE 169* 116*  BUN 32* 21  CREATININE 1.50 1.23  CALCIUM 10.0 9.7  PROT 7.3 6.8  ALBUMIN 4.6 4.4  AST 15 19  ALT 19 22  ALKPHOS 89 80  BILITOT 0.5 0.5    Iron /TIBC/Ferritin/ %Sat    Component Value Date/Time   IRON  89 08/12/2024 1026   TIBC 381 08/12/2024 1026   FERRITIN 330 08/12/2024 1026   IRONPCTSAT 23 08/12/2024 1026      RADIOGRAPHIC STUDIES: I have personally reviewed the radiological images as listed and agreed with the findings in the report. MR PROSTATE W WO CONTRAST Result Date: 07/25/2024 CLINICAL DATA:  Elevated PSA. EXAM: MR PROSTATE WITHOUT AND WITH CONTRAST TECHNIQUE: Multiplanar multisequence MRI images were obtained of the pelvis centered about the prostate. Pre and post contrast images were obtained. CONTRAST:  10mL GADAVIST GADOBUTROL 1 MMOL/ML IV SOLN COMPARISON:  None Available. FINDINGS: Prostate: -- Peripheral Zone: Focal area of restricted diffusion is seen in the right posteromedial mid gland which measures 11 x 7 mm (image 11/8) and corresponds with a T2 hypointense nodule. This shows marked ADC hypointensity and DWI hyperintensity, and early focal contrast enhancement. (PI-RADS 4) A smaller focus of restricted diffusion is seen in the left posteromedial mid gland/apex, which measures 6 x 6 mm on image 13/8. This also shows marked ADC hypointensity and DWI hyperintensity and early focal contrast enhancement. (PI-RADS 4) -- Transition/Central Zone: Mildly enlarged with diffuse involvement by BPH nodules. No suspicious appearing T2 hypointense lesions identified. -- Measurements:  5.2 by 3.5 by 4.8 cm Transcapsular spread:  Absent Seminal vesicle involvement:  Absent Neurovascular bundle involvement:  Absent Pelvic adenopathy: None visualized Bone metastasis: None visualized Other findings:  None IMPRESSION: Lesion 1: 11 mm peripheral zone nodule in the right posteromedial mid gland, suspicious for high-grade carcinoma. PI-RADS 4 (v2.1): High (clinically significant cancer likely). Lesion 2: 6 mm peripheral zone nodule in the left posteromedial mid gland/apex, suspicious for high-grade carcinoma. PI-RADS 4 (v2.1): High  (clinically significant cancer likely). No evidence of extracapsular extension or pelvic metastatic disease. (I have post-processed this exam in the DynaCAD application for potential fusion-guided biopsy.). Electronically Signed   By: Norleen DELENA Kil M.D.   On: 07/25/2024 12:53

## 2024-08-14 NOTE — Assessment & Plan Note (Addendum)
 Labs are reviewed and discussed with patient. Lab Results  Component Value Date   HGB 11.1 (L) 08/12/2024   TIBC 381 08/12/2024   IRONPCTSAT 23 08/12/2024   FERRITIN 330 08/12/2024   Goal of ferritin is 200. Hold Venofer  Hb is >10, no need for epo

## 2024-08-14 NOTE — Assessment & Plan Note (Signed)
 Encourage oral hydration and avoid nephrotoxins.

## 2024-08-14 NOTE — Progress Notes (Signed)
 Pt in for follow up, states fatigues easily, especially on minimal exertion.

## 2024-08-20 ENCOUNTER — Other Ambulatory Visit: Payer: Self-pay | Admitting: Family Medicine

## 2024-08-20 DIAGNOSIS — E782 Mixed hyperlipidemia: Secondary | ICD-10-CM

## 2024-08-21 NOTE — Progress Notes (Signed)
   08/21/24  Indication:   PSA 4.36 (06/14/24)   From 2-4 baseline  MRI (07/25/24) - PIRADS 4 at right mid PZ, PIRADS 4 at Left mid apex, ~57g gland  MRI Fusion Prostate Biopsy Procedure:   Informed consent was obtained, and we discussed the risks of bleeding and infection/sepsis. A time out was performed to ensure correct patient identity.  Pre-Procedure: - Last PSA Level:  Lab Results  Component Value Date   PSA 4.36 (H) 06/14/2024   PSA 4.70 (H) 11/29/2023   PSA 2.85 10/20/2022   - Gentamicin and levaquin given for antibiotic prophylaxis - MRI images were reviewed, targets mapped and uploaded via Dynacad   - Prostate volume by MRI: 57 cc  ROI 1: PIRADS 4 at right mid PZ ROI 2: PIRADS 4 at Left mid apex  Procedure: - DRE = diffusely firm apical gland - Periprostatic nerve block performed using 20 cc 1% lidocaine  - Anatomic measurements obtained revealing a 47 gm prostate, PSA density 0.09 - Small hypoechoic lesion correspondent to ROI1 at Right mid apex  Next, the MRI images were registered to real-time ultrasound using UroNav fusion software with accurate contour alignment. We noted 2 target lesions. Three biopsy cores were obtained from each target lesion. Next, we obtained an additional 12-core systematic biopsy template.   Total biopsy cores: 12 systematic + 3 cores RO1 + 3 cores ROI2 = 18  Post-Procedure: - Patient tolerated the procedure well - He was counseled to seek immediate medical attention if experiences significant bleeding, fevers, urinary retention, or severe pain - Return in one week to discuss biopsy results  Assessment/ Plan: Will follow up in ~7-10 days to discuss pathology  Penne Skye, MD 08/21/2024

## 2024-08-28 ENCOUNTER — Ambulatory Visit: Admitting: Urology

## 2024-08-28 VITALS — BP 135/78 | HR 96 | Ht 66.0 in | Wt 190.0 lb

## 2024-08-28 DIAGNOSIS — C61 Malignant neoplasm of prostate: Secondary | ICD-10-CM

## 2024-08-28 DIAGNOSIS — R972 Elevated prostate specific antigen [PSA]: Secondary | ICD-10-CM

## 2024-08-28 DIAGNOSIS — Z2989 Encounter for other specified prophylactic measures: Secondary | ICD-10-CM

## 2024-08-28 MED ORDER — LEVOFLOXACIN 500 MG PO TABS
500.0000 mg | ORAL_TABLET | Freq: Once | ORAL | Status: AC
  Administered 2024-08-28: 500 mg via ORAL

## 2024-08-28 MED ORDER — LIDOCAINE HCL URETHRAL/MUCOSAL 2 % EX GEL
1.0000 | Freq: Once | CUTANEOUS | Status: AC
  Administered 2024-08-28: 1 via URETHRAL

## 2024-08-28 MED ORDER — GENTAMICIN SULFATE 40 MG/ML IJ SOLN
80.0000 mg | Freq: Once | INTRAMUSCULAR | Status: AC
  Administered 2024-08-28: 80 mg via INTRAMUSCULAR

## 2024-08-28 NOTE — Patient Instructions (Signed)

## 2024-09-02 LAB — PROSTATE CORE NEEDLE BIOPSY

## 2024-09-06 ENCOUNTER — Encounter: Payer: Self-pay | Admitting: Urology

## 2024-09-06 ENCOUNTER — Ambulatory Visit: Admitting: Urology

## 2024-09-06 VITALS — BP 137/77 | HR 78 | Ht 66.0 in | Wt 190.0 lb

## 2024-09-06 DIAGNOSIS — N402 Nodular prostate without lower urinary tract symptoms: Secondary | ICD-10-CM | POA: Diagnosis not present

## 2024-09-06 NOTE — Patient Instructions (Signed)
 Scheduling number: [phone]

## 2024-09-06 NOTE — Progress Notes (Signed)
 09/06/2024 9:26 AM   Theodore Estes 03/23/1946 [id number]  Referring provider: Cleatus Arlyss RAMAN, MD [address] [place],  [place] [address]  Chief Complaint  Patient presents with   Results    HPI: Theodore Estes is a 78 y.o. male presents for prostate biopsy follow-up.  Fusion biopsy 08/28/2024; PSA 4.36; PI-RADS 4 lesion x 2 12 core template +3 cores ROI 1+3 cores ROI 2 No postbiopsy complaints Pathology: ROI #1 Gleason 4+4 adenocarcinoma; ROI #2 Gleason 3+4 adenocarcinoma; 5/12 template cores positive.  See diagram below     PMH: Past Medical History:  Diagnosis Date   Anemia    Diabetes mellitus without complication (HCC)    Dyspnea    Erectile dysfunction    History of colonic polyps    Hyperlipidemia    Hypertension    Hypotestosteronism     Surgical History: Past Surgical History:  Procedure Laterality Date   CATARACT EXTRACTION Left    COLONOSCOPY  08/18/2003   cyst excised from right wrist  09/26/1998   VASECTOMY  09/27/1975    Home Medications:  Allergies as of 09/06/2024   No Known Allergies      Medication List        Accurate as of September 06, 2024  9:26 AM. If you have any questions, ask your nurse or doctor.          aspirin 81 MG tablet Take 81 mg by mouth daily.   clindamycin 1 % lotion Commonly known as: CLEOCIN T Apply topically.   cyanocobalamin  1000 MCG tablet Take 1,000 mcg by mouth daily.   empagliflozin  10 MG Tabs tablet Commonly known as: JARDIANCE  Take 1 tablet (10 mg total) by mouth daily.   lisinopril  2.5 MG tablet Commonly known as: ZESTRIL  TAKE ONE TABLET BY MOUTH ONCE A DAY   lovastatin  20 MG tablet Commonly known as: MEVACOR  TAKE ONE TABLET BY MOUTH ONCE A DAY   metFORMIN  1000 MG tablet Commonly known as: GLUCOPHAGE  TAKE ONE TABLET BY MOUTH TWICE A DAY WITH A MEAL   ONE TOUCH ULTRA 2 w/Device Kit Check blood sugar twice a day and as directed. Dx. E11.65   OneTouch Delica Lancets 33G  Misc Check blood sugar twice a day and as directed. Dx. E11.65   OneTouch Ultra Test test strip Generic drug: glucose blood USE TO CHECK BLOOD SUGARS TWICE DAILY AS DIRECTED.   pioglitazone  45 MG tablet Commonly known as: ACTOS  TAKE ONE TABLET (45 MG TOTAL) BY MOUTH DAILY.        Allergies: Allergies[1]  Family History: Family History  Problem Relation Age of Onset   Cancer Mother        colon   Colon cancer Mother 75   Heart disease Father    Hyperlipidemia Sister    Diabetes Brother    Hypertension Brother    Prostate cancer Neg Hx    Stomach cancer Neg Hx    Esophageal cancer Neg Hx     Social History:  reports that he has never smoked. He has never used smokeless tobacco. He reports that he does not drink alcohol and does not use drugs.   Physical Exam: BP 137/77   Pulse 78   Ht 5' 6 (1.676 m)   Wt 190 lb (86.2 kg)   BMI 30.67 kg/m   Constitutional:  Alert, No acute distress.   Assessment & Plan:    1.  Prostate cancer Clinical T2 Nx Mx NCCN risk stratification: High Pathology report  was discussed in detai PSMA/PET ordered for staging high risk disease Life expectancy is >5 years and we discussed if no evidence of metastatic disease options of RT + ADT and radical prostatectomy He will be notified with PSMA/PET results and further recommendations at that time   Theodore JAYSON Barba, MD  [facility] [address], [address] [place], [place] [address] [phone]    [1] No Known Allergies

## 2024-09-11 ENCOUNTER — Ambulatory Visit
Admission: RE | Admit: 2024-09-11 | Discharge: 2024-09-11 | Disposition: A | Discharge status: Home / Self Care | Source: Ambulatory Visit | Attending: Urology | Admitting: Urology

## 2024-09-11 DIAGNOSIS — N402 Nodular prostate without lower urinary tract symptoms: Secondary | ICD-10-CM | POA: Insufficient documentation

## 2024-09-11 DIAGNOSIS — C61 Malignant neoplasm of prostate: Secondary | ICD-10-CM | POA: Diagnosis present

## 2024-09-11 MED ORDER — FLOTUFOLASTAT F 18 GALLIUM 296-5846 MBQ/ML IV SOLN
8.0000 | Freq: Once | INTRAVENOUS | Status: AC
  Administered 2024-09-11: 14:00:00 8.02 via INTRAVENOUS
  Filled 2024-09-11: qty 8

## 2024-09-20 ENCOUNTER — Ambulatory Visit: Payer: Self-pay | Admitting: Urology

## 2024-09-23 ENCOUNTER — Other Ambulatory Visit: Payer: Self-pay | Admitting: Urology

## 2024-09-23 DIAGNOSIS — C61 Malignant neoplasm of prostate: Secondary | ICD-10-CM

## 2024-10-10 ENCOUNTER — Encounter: Payer: Self-pay | Admitting: Radiation Oncology

## 2024-10-10 ENCOUNTER — Ambulatory Visit
Admission: RE | Admit: 2024-10-10 | Discharge: 2024-10-10 | Disposition: A | Discharge status: Home / Self Care | Source: Ambulatory Visit | Attending: Radiation Oncology | Admitting: Radiation Oncology

## 2024-10-10 VITALS — BP 134/74 | HR 73 | Temp 98.5°F | Resp 16 | Ht 66.0 in | Wt 197.0 lb

## 2024-10-10 DIAGNOSIS — D649 Anemia, unspecified: Secondary | ICD-10-CM | POA: Diagnosis not present

## 2024-10-10 DIAGNOSIS — E119 Type 2 diabetes mellitus without complications: Secondary | ICD-10-CM | POA: Diagnosis not present

## 2024-10-10 DIAGNOSIS — Z7984 Long term (current) use of oral hypoglycemic drugs: Secondary | ICD-10-CM | POA: Diagnosis not present

## 2024-10-10 DIAGNOSIS — E291 Testicular hypofunction: Secondary | ICD-10-CM | POA: Insufficient documentation

## 2024-10-10 DIAGNOSIS — N529 Male erectile dysfunction, unspecified: Secondary | ICD-10-CM | POA: Insufficient documentation

## 2024-10-10 DIAGNOSIS — C61 Malignant neoplasm of prostate: Secondary | ICD-10-CM | POA: Insufficient documentation

## 2024-10-10 DIAGNOSIS — Z800 Family history of malignant neoplasm of digestive organs: Secondary | ICD-10-CM | POA: Diagnosis not present

## 2024-10-10 DIAGNOSIS — Z7982 Long term (current) use of aspirin: Secondary | ICD-10-CM | POA: Insufficient documentation

## 2024-10-10 DIAGNOSIS — Z79899 Other long term (current) drug therapy: Secondary | ICD-10-CM | POA: Diagnosis not present

## 2024-10-10 DIAGNOSIS — Z860101 Personal history of adenomatous and serrated colon polyps: Secondary | ICD-10-CM | POA: Diagnosis not present

## 2024-10-10 DIAGNOSIS — R0602 Shortness of breath: Secondary | ICD-10-CM | POA: Insufficient documentation

## 2024-10-10 DIAGNOSIS — I10 Essential (primary) hypertension: Secondary | ICD-10-CM | POA: Insufficient documentation

## 2024-10-10 NOTE — Consult Note (Signed)
 " NEW PATIENT EVALUATION  Name: Theodore Estes  MRN: [id number]  Date:   10/10/2024     DOB: December 01, 1945   This 79 y.o. male patient presents to the clinic f stage IIc (cT2c N0 M0) Gleason 8 (4+4) adenocarcinoma the prostate presenting with a PSA in the 5 range  REFERRING PHYSICIAN: Twylla Glendia BROCKS, MD  CHIEF COMPLAINT:  Chief Complaint  Patient presents with   Prostate Cancer    DIAGNOSIS: The encounter diagnosis was Malignant neoplasm of prostate (HCC).   PREVIOUS INVESTIGATIONS:  MRI scan and PSMA PET scan both reviewed Clinical notes reviewed Pathology reports reviewed  HPI: Patient is a 79 year old male who presents with an elevated PSA in the 5 range.  He underwent an MRI scan of his prostate showing a 11 mm peripheral zone nodule in the right posterior middle mid gland suspicious for high-grade carcinoma also had a 6 mm peripheral zone nodule in the left posterior middle gland again suspicious for high-grade carcinoma.  No evidence of extracapsular stanchion pelvic adenopathy or bone metastasis.  Seminal vesicles were within normal limits.  He went on to have UroNav biopsy showing 7 of 14 cores positive for adenocarcinoma.  A mixture of Gleason 6 as well as Gleason 8 (4+4) as well as Gleason 7 (3+4).  Patient is fairly asymptomatic has nocturia x 1 no bone pain.  He underwent a PSMA PET scan showing no focal activity in the prostate gland.  No evidence of metastatic lymphadenopathy no evidence of visceral metastasis or skeletal metastasis.  On my review there is diffuse slight uptake throughout the prostate gland.  Patient is now referred to radiation oncology for opinion.  PLANNED TREATMENT REGIMEN: Image guided IMRT radiation therapy plus ADT therapy for 6 months  PAST MEDICAL HISTORY:  has a past medical history of Anemia, Diabetes mellitus without complication (HCC), Dyspnea, Erectile dysfunction, History of colonic polyps, Hyperlipidemia, Hypertension, and Hypotestosteronism.     PAST SURGICAL HISTORY:  Past Surgical History:  Procedure Laterality Date   CATARACT EXTRACTION Left    COLONOSCOPY  08/18/2003   cyst excised from right wrist  09/26/1998   VASECTOMY  09/27/1975    FAMILY HISTORY: family history includes Cancer in his mother; Colon cancer (age of onset: 52) in his mother; Diabetes in his brother; Heart disease in his father; Hyperlipidemia in his sister; Hypertension in his brother.  SOCIAL HISTORY:  reports that he has never smoked. He has never used smokeless tobacco. He reports that he does not drink alcohol and does not use drugs.  ALLERGIES: Patient has no known allergies.  MEDICATIONS:  Current Outpatient Medications  Medication Sig Dispense Refill   aspirin 81 MG tablet Take 81 mg by mouth daily.     Blood Glucose Monitoring Suppl (ONE TOUCH ULTRA 2) w/Device KIT Check blood sugar twice a day and as directed. Dx. E11.65 1 each 0   clindamycin (CLEOCIN T) 1 % lotion Apply topically.     cyanocobalamin  1000 MCG tablet Take 1,000 mcg by mouth daily.     empagliflozin  (JARDIANCE ) 10 MG TABS tablet Take 1 tablet (10 mg total) by mouth daily. 90 tablet 3   lisinopril  (ZESTRIL ) 2.5 MG tablet TAKE ONE TABLET BY MOUTH ONCE A DAY 90 tablet 3   lovastatin  (MEVACOR ) 20 MG tablet TAKE ONE TABLET BY MOUTH ONCE A DAY 90 tablet 3   metFORMIN  (GLUCOPHAGE ) 1000 MG tablet TAKE ONE TABLET BY MOUTH TWICE A DAY WITH A MEAL 180 tablet 3   ONETOUCH  DELICA LANCETS 33G MISC Check blood sugar twice a day and as directed. Dx. E11.65 100 each 5   ONETOUCH ULTRA TEST test strip USE TO CHECK BLOOD SUGARS TWICE DAILY AS DIRECTED. 100 each 11   pioglitazone  (ACTOS ) 45 MG tablet TAKE ONE TABLET (45 MG TOTAL) BY MOUTH DAILY. 90 tablet 3   No current facility-administered medications for this encounter.    ECOG PERFORMANCE STATUS:  0 - Asymptomatic  REVIEW OF SYSTEMS: Patient denies any weight loss, fatigue, weakness, fever, chills or night sweats. Patient denies any  loss of vision, blurred vision. Patient denies any ringing  of the ears or hearing loss. No irregular heartbeat. Patient denies heart murmur or history of fainting. Patient denies any chest pain or pain radiating to her upper extremities. Patient denies any shortness of breath, difficulty breathing at night, cough or hemoptysis. Patient denies any swelling in the lower legs. Patient denies any nausea vomiting, vomiting of blood, or coffee ground material in the vomitus. Patient denies any stomach pain. Patient states has had normal bowel movements no significant constipation or diarrhea. Patient denies any dysuria, hematuria or significant nocturia. Patient denies any problems walking, swelling in the joints or loss of balance. Patient denies any skin changes, loss of hair or loss of weight. Patient denies any excessive worrying or anxiety or significant depression. Patient denies any problems with insomnia. Patient denies excessive thirst, polyuria, polydipsia. Patient denies any swollen glands, patient denies easy bruising or easy bleeding. Patient denies any recent infections, allergies or URI. Patient s visual fields have not changed significantly in recent time.   PHYSICAL EXAM: BP 134/74   Pulse 73   Temp 98.5 F (36.9 C) (Tympanic)   Resp 16   Ht 5' 6 (1.676 m)   Wt 197 lb (89.4 kg)   BMI 31.80 kg/m  Well-developed well-nourished patient in NAD. HEENT reveals PERLA, EOMI, discs not visualized.  Oral cavity is clear. No oral mucosal lesions are identified. Neck is clear without evidence of cervical or supraclavicular adenopathy. Lungs are clear to A&P. Cardiac examination is essentially unremarkable with regular rate and rhythm without murmur rub or thrill. Abdomen is benign with no organomegaly or masses noted. Motor sensory and DTR levels are equal and symmetric in the upper and lower extremities. Cranial nerves II through XII are grossly intact. Proprioception is intact. No peripheral  adenopathy or edema is identified. No motor or sensory levels are noted. Crude visual fields are within normal range.  LABORATORY DATA: Pathology reports reviewed    RADIOLOGY RESULTS: PSMA PET scan as well as MRI scan of his prostate reviewed compatible with above-stated findings   IMPRESSION: Stage IIc Gleason 8 adenocarcinoma the prostate in 79 year old male  PLAN: At this time based on his high Gleason score I would opt to treat with IMRT radiation therapy to 80 Gray to his prostate.  Based on his [facility] nomogram shows a low risk of pelvic lymph node involvement.  I will not treat his pelvic nodes he also had no evidence of adenopathy or distant disease on his PSMA PET scan.  Risks and benefits of treatment occluding increased lower Neri tract symptoms possible diarrhea fatigue alteration blood counts all were reviewed with the patient.  I have asked Dr. Twylla to place fiducial markers for image guided treatment.  I have also asked him to initiate Eligard 1 38-month depot injection for concurrent ADT therapy during radiation.  Patient comprehends my treatment plan well.  We will schedule CT simulation  once markers are placed.  I would like to take this opportunity to thank you for allowing me to participate in the care of your patient.SABRA Marcey Penton, MD         "

## 2024-10-15 ENCOUNTER — Other Ambulatory Visit: Payer: Self-pay | Admitting: Family Medicine

## 2024-10-17 ENCOUNTER — Telehealth: Payer: Self-pay

## 2024-10-17 NOTE — Telephone Encounter (Signed)
 Auth Submission: NO AUTH NEEDED Site of care: Urology Payer: [employer] Medication & CPT/J Code(s) submitted: Eligard Diagnosis Code:  Route of submission (phone, fax, portal):  Phone # Fax # Auth type: Buy/Bill PB Units/visits requested: 45mg  x 2 doses Reference number:  Approval from: 10/17/24 to 09/25/25

## 2024-10-25 ENCOUNTER — Encounter (INDEPENDENT_AMBULATORY_CARE_PROVIDER_SITE_OTHER): Payer: PPO | Admitting: Ophthalmology

## 2024-10-25 DIAGNOSIS — H31001 Unspecified chorioretinal scars, right eye: Secondary | ICD-10-CM | POA: Diagnosis not present

## 2024-10-25 DIAGNOSIS — Z7984 Long term (current) use of oral hypoglycemic drugs: Secondary | ICD-10-CM | POA: Diagnosis not present

## 2024-10-25 DIAGNOSIS — H43813 Vitreous degeneration, bilateral: Secondary | ICD-10-CM | POA: Diagnosis not present

## 2024-10-25 DIAGNOSIS — H35371 Puckering of macula, right eye: Secondary | ICD-10-CM | POA: Diagnosis not present

## 2024-10-25 DIAGNOSIS — E113293 Type 2 diabetes mellitus with mild nonproliferative diabetic retinopathy without macular edema, bilateral: Secondary | ICD-10-CM | POA: Diagnosis not present

## 2024-10-28 ENCOUNTER — Encounter: Admitting: Urology

## 2024-10-29 ENCOUNTER — Ambulatory Visit: Admitting: Urology

## 2024-10-29 VITALS — BP 124/72 | HR 71 | Ht 66.0 in | Wt 190.0 lb

## 2024-10-29 DIAGNOSIS — C61 Malignant neoplasm of prostate: Secondary | ICD-10-CM

## 2024-10-29 MED ORDER — LEUPROLIDE ACETATE (6 MONTH) 45 MG ~~LOC~~ KIT
45.0000 mg | PACK | Freq: Once | SUBCUTANEOUS | Status: AC
  Administered 2024-10-29: 45 mg via SUBCUTANEOUS

## 2024-10-29 MED ORDER — LEVOFLOXACIN 500 MG PO TABS
500.0000 mg | ORAL_TABLET | Freq: Once | ORAL | Status: AC
  Administered 2024-10-29: 500 mg via ORAL

## 2024-10-29 MED ORDER — GENTAMICIN SULFATE 40 MG/ML IJ SOLN
80.0000 mg | Freq: Once | INTRAMUSCULAR | Status: AC
  Administered 2024-10-29: 80 mg via INTRAMUSCULAR

## 2024-10-29 NOTE — Progress Notes (Unsigned)
" ° °  10/29/24  CC: gold fiducial marker placement  HPI: 79 y.o. male with prostate cancer who presents today for placement of fiducial markers in anticipation of his upcoming IMRT with Dr. Lenn.  Prostate Gold fiducial Marker Placement Procedure   Informed consent was obtained after discussing risks/benefits of the procedure.  A time out was performed to ensure correct patient identity.  Pre-Procedure: - Gentamicin  given prophylactically - PO Levaquin  500 mg also given today  Procedure: - Rectal ultrasound probe was placed without difficulty and the prostate visualized - Prostatic block performed with 10 mL 1% Xylocaine  - 3 fiducial gold seed markers placed, one at right base, one at left base, one at apex of prostate gland under transrectal ultrasound guidance  Post-Procedure: - Patient tolerated the procedure well - He was counseled to seek immediate medical attention if experiences any severe pain, significant bleeding, or fevers - ADT x 6 months recommended by radiation oncology.  He received Eligard  injection.  We discussed most common side effects of hot flashes, tiredness/fatigue and osteoporosis/osteopenia    Glendia Barba, MD  "

## 2024-10-30 ENCOUNTER — Ambulatory Visit

## 2024-11-05 ENCOUNTER — Ambulatory Visit

## 2024-11-12 ENCOUNTER — Ambulatory Visit

## 2024-11-13 ENCOUNTER — Ambulatory Visit

## 2024-11-14 ENCOUNTER — Ambulatory Visit

## 2024-11-15 ENCOUNTER — Ambulatory Visit

## 2024-11-18 ENCOUNTER — Ambulatory Visit

## 2024-11-19 ENCOUNTER — Ambulatory Visit

## 2024-11-20 ENCOUNTER — Ambulatory Visit

## 2024-11-21 ENCOUNTER — Ambulatory Visit

## 2024-11-22 ENCOUNTER — Ambulatory Visit

## 2024-11-25 ENCOUNTER — Ambulatory Visit

## 2024-11-26 ENCOUNTER — Ambulatory Visit

## 2024-11-27 ENCOUNTER — Ambulatory Visit

## 2024-11-28 ENCOUNTER — Ambulatory Visit

## 2024-11-29 ENCOUNTER — Ambulatory Visit

## 2024-12-02 ENCOUNTER — Ambulatory Visit

## 2024-12-03 ENCOUNTER — Ambulatory Visit

## 2024-12-04 ENCOUNTER — Ambulatory Visit

## 2024-12-05 ENCOUNTER — Ambulatory Visit

## 2024-12-06 ENCOUNTER — Ambulatory Visit

## 2024-12-09 ENCOUNTER — Ambulatory Visit

## 2024-12-10 ENCOUNTER — Ambulatory Visit

## 2024-12-11 ENCOUNTER — Ambulatory Visit

## 2024-12-12 ENCOUNTER — Ambulatory Visit

## 2024-12-13 ENCOUNTER — Ambulatory Visit

## 2024-12-16 ENCOUNTER — Ambulatory Visit

## 2024-12-17 ENCOUNTER — Ambulatory Visit

## 2024-12-18 ENCOUNTER — Ambulatory Visit

## 2024-12-19 ENCOUNTER — Ambulatory Visit

## 2024-12-20 ENCOUNTER — Ambulatory Visit

## 2024-12-23 ENCOUNTER — Ambulatory Visit

## 2024-12-24 ENCOUNTER — Ambulatory Visit

## 2024-12-25 ENCOUNTER — Ambulatory Visit

## 2024-12-26 ENCOUNTER — Ambulatory Visit

## 2024-12-27 ENCOUNTER — Ambulatory Visit

## 2024-12-30 ENCOUNTER — Ambulatory Visit

## 2024-12-31 ENCOUNTER — Ambulatory Visit

## 2025-01-01 ENCOUNTER — Ambulatory Visit

## 2025-01-02 ENCOUNTER — Ambulatory Visit

## 2025-01-03 ENCOUNTER — Ambulatory Visit

## 2025-01-06 ENCOUNTER — Ambulatory Visit

## 2025-01-07 ENCOUNTER — Ambulatory Visit

## 2025-02-04 ENCOUNTER — Inpatient Hospital Stay

## 2025-02-11 ENCOUNTER — Inpatient Hospital Stay

## 2025-02-11 ENCOUNTER — Inpatient Hospital Stay: Admitting: Oncology

## 2025-06-20 ENCOUNTER — Encounter: Admitting: Family Medicine

## 2025-06-20 ENCOUNTER — Ambulatory Visit

## 2025-10-27 ENCOUNTER — Encounter (INDEPENDENT_AMBULATORY_CARE_PROVIDER_SITE_OTHER): Admitting: Ophthalmology
